# Patient Record
Sex: Female | Born: 1965 | Race: White | Hispanic: No | Marital: Single | Smoking: Never smoker
Health system: Southern US, Community
[De-identification: ages and names within clinical notes are randomized; demographics above are authoritative.]

## PROBLEM LIST (undated history)

## (undated) DIAGNOSIS — I10 Essential (primary) hypertension: Secondary | ICD-10-CM

## (undated) DIAGNOSIS — G4730 Sleep apnea, unspecified: Secondary | ICD-10-CM

## (undated) DIAGNOSIS — J302 Other seasonal allergic rhinitis: Secondary | ICD-10-CM

## (undated) DIAGNOSIS — D649 Anemia, unspecified: Secondary | ICD-10-CM

## (undated) DIAGNOSIS — E7800 Pure hypercholesterolemia, unspecified: Secondary | ICD-10-CM

## (undated) DIAGNOSIS — J45909 Unspecified asthma, uncomplicated: Secondary | ICD-10-CM

## (undated) DIAGNOSIS — K219 Gastro-esophageal reflux disease without esophagitis: Secondary | ICD-10-CM

## (undated) DIAGNOSIS — E039 Hypothyroidism, unspecified: Secondary | ICD-10-CM

## (undated) DIAGNOSIS — Z8619 Personal history of other infectious and parasitic diseases: Secondary | ICD-10-CM

## (undated) DIAGNOSIS — R011 Cardiac murmur, unspecified: Secondary | ICD-10-CM

## (undated) HISTORY — PX: OTHER SURGICAL HISTORY: SHX169

## (undated) HISTORY — DX: Other seasonal allergic rhinitis: J30.2

## (undated) HISTORY — DX: Essential (primary) hypertension: I10

## (undated) HISTORY — DX: Sleep apnea, unspecified: G47.30

## (undated) HISTORY — DX: Hypothyroidism, unspecified: E03.9

## (undated) HISTORY — DX: Anemia, unspecified: D64.9

## (undated) HISTORY — DX: Cardiac murmur, unspecified: R01.1

## (undated) HISTORY — DX: Gastro-esophageal reflux disease without esophagitis: K21.9

## (undated) HISTORY — DX: Pure hypercholesterolemia, unspecified: E78.00

## (undated) HISTORY — DX: Unspecified asthma, uncomplicated: J45.909

## (undated) HISTORY — DX: Personal history of other infectious and parasitic diseases: Z86.19

---

## 2002-10-19 HISTORY — PX: DILATION AND CURETTAGE OF UTERUS: SHX78

## 2002-11-19 HISTORY — PX: MYOMECTOMY: SHX85

## 2003-03-21 HISTORY — PX: BREAST CYST ASPIRATION: SHX578

## 2003-06-11 HISTORY — PX: VAGINAL HYSTERECTOMY: SUR661

## 2003-06-19 HISTORY — PX: BREAST SURGERY: SHX581

## 2003-12-24 ENCOUNTER — Ambulatory Visit: Payer: Self-pay | Admitting: Internal Medicine

## 2004-01-25 ENCOUNTER — Ambulatory Visit: Payer: Self-pay

## 2004-02-18 ENCOUNTER — Ambulatory Visit: Payer: Self-pay | Admitting: Internal Medicine

## 2004-04-11 ENCOUNTER — Ambulatory Visit: Payer: Self-pay

## 2004-04-14 ENCOUNTER — Ambulatory Visit: Payer: Self-pay | Admitting: Internal Medicine

## 2004-05-05 ENCOUNTER — Ambulatory Visit: Payer: Self-pay | Admitting: Internal Medicine

## 2004-05-18 ENCOUNTER — Ambulatory Visit: Payer: Self-pay | Admitting: Internal Medicine

## 2004-06-18 ENCOUNTER — Ambulatory Visit: Payer: Self-pay | Admitting: Internal Medicine

## 2004-09-08 ENCOUNTER — Ambulatory Visit: Payer: Self-pay | Admitting: Obstetrics and Gynecology

## 2005-03-20 HISTORY — PX: CHOLECYSTECTOMY: SHX55

## 2005-07-20 ENCOUNTER — Ambulatory Visit: Payer: Self-pay | Admitting: Internal Medicine

## 2006-01-22 ENCOUNTER — Emergency Department: Payer: Self-pay | Admitting: Emergency Medicine

## 2006-01-31 ENCOUNTER — Ambulatory Visit: Payer: Self-pay | Admitting: General Surgery

## 2006-09-27 ENCOUNTER — Ambulatory Visit: Payer: Self-pay | Admitting: Internal Medicine

## 2007-09-30 ENCOUNTER — Ambulatory Visit: Payer: Self-pay | Admitting: Internal Medicine

## 2008-08-06 ENCOUNTER — Ambulatory Visit (HOSPITAL_COMMUNITY): Admission: RE | Admit: 2008-08-06 | Discharge: 2008-08-06 | Payer: Self-pay | Admitting: Surgery

## 2008-08-20 ENCOUNTER — Ambulatory Visit (HOSPITAL_COMMUNITY): Admission: RE | Admit: 2008-08-20 | Discharge: 2008-08-20 | Payer: Self-pay | Admitting: Surgery

## 2008-08-24 ENCOUNTER — Encounter: Admission: RE | Admit: 2008-08-24 | Discharge: 2008-08-24 | Payer: Self-pay | Admitting: Surgery

## 2008-08-26 ENCOUNTER — Ambulatory Visit: Payer: Self-pay | Admitting: Internal Medicine

## 2008-09-17 ENCOUNTER — Ambulatory Visit: Payer: Self-pay | Admitting: Internal Medicine

## 2008-10-01 ENCOUNTER — Ambulatory Visit: Payer: Self-pay | Admitting: Internal Medicine

## 2008-10-20 ENCOUNTER — Ambulatory Visit: Payer: Self-pay | Admitting: Internal Medicine

## 2008-11-19 ENCOUNTER — Encounter: Admission: RE | Admit: 2008-11-19 | Discharge: 2008-11-19 | Payer: Self-pay | Admitting: Surgery

## 2008-12-07 ENCOUNTER — Ambulatory Visit (HOSPITAL_COMMUNITY): Admission: RE | Admit: 2008-12-07 | Discharge: 2008-12-07 | Payer: Self-pay | Admitting: Surgery

## 2009-01-19 ENCOUNTER — Ambulatory Visit (HOSPITAL_COMMUNITY): Admission: RE | Admit: 2009-01-19 | Discharge: 2009-01-20 | Payer: Self-pay | Admitting: Surgery

## 2009-01-29 HISTORY — PX: LAPAROSCOPIC GASTRIC BANDING: SHX1100

## 2009-02-02 ENCOUNTER — Encounter: Admission: RE | Admit: 2009-02-02 | Discharge: 2009-02-02 | Payer: Self-pay | Admitting: Surgery

## 2009-03-22 ENCOUNTER — Encounter: Admission: RE | Admit: 2009-03-22 | Discharge: 2009-06-20 | Payer: Self-pay | Admitting: Surgery

## 2009-08-10 ENCOUNTER — Encounter: Admission: RE | Admit: 2009-08-10 | Discharge: 2009-08-10 | Payer: Self-pay | Admitting: Surgery

## 2009-11-05 ENCOUNTER — Ambulatory Visit: Payer: Self-pay | Admitting: Internal Medicine

## 2010-01-31 ENCOUNTER — Encounter: Admission: RE | Admit: 2010-01-31 | Discharge: 2010-01-31 | Payer: Self-pay | Admitting: General Surgery

## 2010-05-23 ENCOUNTER — Encounter: Payer: Self-pay | Attending: Surgery | Admitting: *Deleted

## 2010-05-23 DIAGNOSIS — Z01818 Encounter for other preprocedural examination: Secondary | ICD-10-CM | POA: Insufficient documentation

## 2010-05-23 DIAGNOSIS — Z713 Dietary counseling and surveillance: Secondary | ICD-10-CM | POA: Insufficient documentation

## 2010-06-22 LAB — DIFFERENTIAL
Basophils Absolute: 0.1 10*3/uL (ref 0.0–0.1)
Eosinophils Relative: 0 % (ref 0–5)
Lymphocytes Relative: 15 % (ref 12–46)
Lymphocytes Relative: 8 % — ABNORMAL LOW (ref 12–46)
Lymphs Abs: 1.1 10*3/uL (ref 0.7–4.0)
Monocytes Absolute: 0.6 10*3/uL (ref 0.1–1.0)
Neutro Abs: 8.8 10*3/uL — ABNORMAL HIGH (ref 1.7–7.7)
Neutrophils Relative %: 77 % (ref 43–77)

## 2010-06-22 LAB — COMPREHENSIVE METABOLIC PANEL
BUN: 13 mg/dL (ref 6–23)
CO2: 31 mEq/L (ref 19–32)
Chloride: 99 mEq/L (ref 96–112)
Creatinine, Ser: 0.73 mg/dL (ref 0.4–1.2)
GFR calc non Af Amer: >60 mL/min (ref >60)
Total Bilirubin: 0.8 mg/dL (ref 0.3–1.2)

## 2010-06-22 LAB — CBC
HCT: 41.5 % (ref 36.0–46.0)
HCT: 43.7 % (ref 36.0–46.0)
Hemoglobin: 14.1 g/dL (ref 12.0–15.0)
MCV: 88.4 fL (ref 78.0–100.0)
Platelets: 279 10*3/uL (ref 150–400)
RBC: 4.94 MIL/uL (ref 3.87–5.11)
WBC: 11.4 10*3/uL — ABNORMAL HIGH (ref 4.0–10.5)
WBC: 14.4 10*3/uL — ABNORMAL HIGH (ref 4.0–10.5)

## 2010-06-24 LAB — COMPREHENSIVE METABOLIC PANEL
Albumin: 3.7 g/dL (ref 3.5–5.2)
BUN: 14 mg/dL (ref 6–23)
Chloride: 100 mEq/L (ref 96–112)
Creatinine, Ser: 0.73 mg/dL (ref 0.4–1.2)
GFR calc non Af Amer: >60 mL/min (ref >60)
Glucose, Bld: 86 mg/dL (ref 70–99)
Total Bilirubin: 0.7 mg/dL (ref 0.3–1.2)

## 2010-06-24 LAB — DIFFERENTIAL
Basophils Absolute: 0.1 10*3/uL (ref 0.0–0.1)
Lymphocytes Relative: 17 % (ref 12–46)
Monocytes Absolute: 0.7 10*3/uL (ref 0.1–1.0)
Neutro Abs: 7 10*3/uL (ref 1.7–7.7)
Neutrophils Relative %: 74 % (ref 43–77)

## 2010-06-24 LAB — CBC
HCT: 42.3 % (ref 36.0–46.0)
MCV: 87.7 fL (ref 78.0–100.0)
Platelets: 281 10*3/uL (ref 150–400)
WBC: 9.5 10*3/uL (ref 4.0–10.5)

## 2010-08-12 ENCOUNTER — Ambulatory Visit: Payer: Self-pay | Admitting: Gastroenterology

## 2010-11-07 ENCOUNTER — Ambulatory Visit: Payer: Self-pay | Admitting: Internal Medicine

## 2010-11-25 ENCOUNTER — Encounter (INDEPENDENT_AMBULATORY_CARE_PROVIDER_SITE_OTHER): Payer: 59

## 2010-11-25 ENCOUNTER — Encounter (INDEPENDENT_AMBULATORY_CARE_PROVIDER_SITE_OTHER): Payer: Self-pay

## 2010-11-25 ENCOUNTER — Ambulatory Visit (INDEPENDENT_AMBULATORY_CARE_PROVIDER_SITE_OTHER): Payer: 59 | Admitting: Physician Assistant

## 2010-11-25 VITALS — BP 140/90 | HR 60 | Ht 64.0 in | Wt 253.4 lb

## 2010-11-25 DIAGNOSIS — Z4651 Encounter for fitting and adjustment of gastric lap band: Secondary | ICD-10-CM

## 2010-11-25 NOTE — Progress Notes (Signed)
  HISTORY: Kristine Hendricks is a 45 y.o.female who received an AP-Large lap-band in November 2010 by Dr. Daphine Deutscher. She denies persistent vomiting or regurgitation. She says her hunger has increased somewhat as have her portion sizes.  VITAL SIGNS: Filed Vitals:   11/25/10 1512  BP: 140/90  Pulse: 60    PHYSICAL EXAM: Physical exam reveals a very well-appearing 45 y.o.female in no apparent distress Neurologic: Awake, alert, oriented Psych: Bright affect, conversant Respiratory: Breathing even and unlabored. No stridor or wheezing Abdomen: Soft, nontender, nondistended to palpation. Incisions well-healed. No incisional hernias. Port easily palpated. Extremities: Atraumatic, good range of motion.  ASSESMENT: 45 y.o.  female  s/p AP-Large lap-band.   PLAN: The patient's port was accessed with a 20G Huber needle without difficulty. Clear fluid was aspirated and 0.25 mL saline was added to the port to give a total predicted volume of 6.75 mL. The patient was able to swallow water without difficulty following the procedure and was instructed to take clear liquids for the next 24-48 hours and advance slowly as tolerated.

## 2010-11-25 NOTE — Patient Instructions (Signed)
Take clear liquids for the next 48 hours. Thin protein shakes are ok to start on Saturday evening. Call us if you have persistent vomiting or regurgitation, night cough or reflux symptoms. Return as scheduled or sooner if you notice no changes in hunger/portion sizes.   

## 2010-12-01 ENCOUNTER — Telehealth (INDEPENDENT_AMBULATORY_CARE_PROVIDER_SITE_OTHER): Payer: Self-pay

## 2010-12-01 NOTE — Telephone Encounter (Signed)
Left message telling patient [facility] for tomorrow is canceled and was trying to reach her to reschedule.

## 2010-12-02 ENCOUNTER — Encounter (INDEPENDENT_AMBULATORY_CARE_PROVIDER_SITE_OTHER): Payer: 59

## 2010-12-23 ENCOUNTER — Encounter (INDEPENDENT_AMBULATORY_CARE_PROVIDER_SITE_OTHER): Payer: Self-pay

## 2010-12-23 ENCOUNTER — Ambulatory Visit (INDEPENDENT_AMBULATORY_CARE_PROVIDER_SITE_OTHER): Payer: 59 | Admitting: Physician Assistant

## 2010-12-23 VITALS — BP 142/88 | HR 64 | Temp 98.0°F | Resp 20 | Ht 64.0 in | Wt 248.1 lb

## 2010-12-23 DIAGNOSIS — K219 Gastro-esophageal reflux disease without esophagitis: Secondary | ICD-10-CM

## 2010-12-23 DIAGNOSIS — Z4651 Encounter for fitting and adjustment of gastric lap band: Secondary | ICD-10-CM

## 2010-12-23 NOTE — Patient Instructions (Signed)
Take omeprazole (20 mg tablets, over-the-counter) twice a day for one week followed by once a day for one week. Return in one month. Return next week if symptoms persist despite the above measures.

## 2010-12-23 NOTE — Progress Notes (Signed)
  HISTORY: Kristine Hendricks is a 45 y.o.female who received an AP-Standard lap-band in November 2010 by Dr. Daphine Deutscher. She comes in with a 2 week history of significant GERD symptoms. No solid food dysphagia. She's awakened from sleep nightly from this.  VITAL SIGNS: Filed Vitals:   12/23/10 1134  BP: 142/88  Pulse: 64  Temp: 98 F (36.7 C)  Resp: 20    PHYSICAL EXAM: Physical exam reveals a very well-appearing 45 y.o.female in no apparent distress Neurologic: Awake, alert, oriented Psych: Bright affect, conversant Respiratory: Breathing even and unlabored. No stridor or wheezing Abdomen: Soft, nontender, nondistended to palpation. Incisions well-healed. No incisional hernias. Port easily palpated. Extremities: Atraumatic, good range of motion.  ASSESMENT: 45 y.o.  female  s/p AP-Standard lap-band.   PLAN: The patient's port was accessed with a 20G Huber needle without difficulty. Clear fluid was aspirated and 0.3 mL saline was removed from the port to give a total predicted volume of 6.45 mL. I've recommended omeprazole BID for one week followed by QD for one week. Follow-up in one month.

## 2011-01-20 ENCOUNTER — Encounter (INDEPENDENT_AMBULATORY_CARE_PROVIDER_SITE_OTHER): Payer: 59

## 2011-01-20 ENCOUNTER — Ambulatory Visit
Admission: RE | Admit: 2011-01-20 | Discharge: 2011-01-20 | Disposition: A | Discharge status: Home / Self Care | Payer: 59 | Source: Ambulatory Visit | Attending: Physician Assistant | Admitting: Physician Assistant

## 2011-01-20 ENCOUNTER — Ambulatory Visit (INDEPENDENT_AMBULATORY_CARE_PROVIDER_SITE_OTHER): Payer: 59 | Admitting: Physician Assistant

## 2011-01-20 ENCOUNTER — Encounter (INDEPENDENT_AMBULATORY_CARE_PROVIDER_SITE_OTHER): Payer: Self-pay

## 2011-01-20 VITALS — BP 150/100 | HR 60 | Resp 18 | Ht 64.0 in | Wt 247.0 lb

## 2011-01-20 DIAGNOSIS — Z4651 Encounter for fitting and adjustment of gastric lap band: Secondary | ICD-10-CM

## 2011-01-20 DIAGNOSIS — K219 Gastro-esophageal reflux disease without esophagitis: Secondary | ICD-10-CM

## 2011-01-20 NOTE — Progress Notes (Signed)
  HISTORY: CESILIA SHINN is a 45 y.o.female who received an AP-Large lap-band in November 2010 by Dr. Daphine Deutscher. She was seen on 10/5 for GERD symptoms and had fluid removed. She says her symptoms improved but not completely. She has continued nocturnal reflux, worse if she doesn't take a PPI.  VITAL SIGNS: Filed Vitals:   01/20/11 1136  BP: 150/100  Pulse: 60  Resp: 18    PHYSICAL EXAM: Physical exam reveals a very well-appearing 44 y.o.female in no apparent distress Neurologic: Awake, alert, oriented Psych: Bright affect, conversant Respiratory: Breathing even and unlabored. No stridor or wheezing Abdomen: Soft, nontender, nondistended to palpation. Incisions well-healed. No incisional hernias. Port easily palpated. Extremities: Atraumatic, good range of motion.  ASSESMENT: 45 y.o.  female  s/p AP-Large lap-band.   PLAN: The patient's port was accessed with a 20G Huber needle without difficulty. Clear fluid was aspirated and 0.25 mL saline was removed from the port to give a total predicted volume of 6.2 mL. I've ordered a KUB to evaluate band position. If her symptoms improve, we'll have her back in one month. If not, I'll likely order an upper GI.

## 2011-01-20 NOTE — Patient Instructions (Signed)
Obtain x-ray today. Return next week if symptoms persist. Return in 3 months otherwise.

## 2011-01-26 ENCOUNTER — Telehealth (INDEPENDENT_AMBULATORY_CARE_PROVIDER_SITE_OTHER): Payer: Self-pay | Admitting: Physician Assistant

## 2011-05-18 ENCOUNTER — Encounter (INDEPENDENT_AMBULATORY_CARE_PROVIDER_SITE_OTHER): Payer: Self-pay

## 2011-05-18 ENCOUNTER — Ambulatory Visit (INDEPENDENT_AMBULATORY_CARE_PROVIDER_SITE_OTHER): Payer: 59 | Admitting: Physician Assistant

## 2011-05-18 NOTE — Patient Instructions (Signed)
Take a protein shake daily for breakfast. Contact Amy in nutrition for a consultation. Return in 2 months or sooner if needed.

## 2011-05-18 NOTE — Progress Notes (Signed)
  HISTORY: Kristine Hendricks is a 46 y.o.female who received an AP-Large lap-band in November 2010 by Dr. Daphine Deutscher. She was last seen in November and has since gained eight pounds. She attributes this from eating less than usual and making poor food choices. She's not been eating breakfast of late, which she said helped her early in her weight loss.  VITAL SIGNS: Filed Vitals:   05/18/11 1058  BP: 130/82    PHYSICAL EXAM: Physical exam reveals a very well-appearing 46 y.o.female in no apparent distress Neurologic: Awake, alert, oriented Psych: Bright affect, conversant Respiratory: Breathing even and unlabored. No stridor or wheezing Extremities: Atraumatic, good range of motion. Skin: Warm, Dry, no rashes Musculoskeletal: Normal gait, Joints normal  ASSESMENT: 46 y.o.  female  s/p AP-Large lap-band.   PLAN: We talked about how she's very much in the green zone but her weight gain is likely due to her food choices throughout the day. I recommended having a protein shake daily and to have a nutrition consultation.

## 2011-07-13 ENCOUNTER — Encounter (INDEPENDENT_AMBULATORY_CARE_PROVIDER_SITE_OTHER): Payer: 59

## 2011-08-10 ENCOUNTER — Ambulatory Visit (INDEPENDENT_AMBULATORY_CARE_PROVIDER_SITE_OTHER): Payer: 59 | Admitting: Physician Assistant

## 2011-08-10 ENCOUNTER — Encounter (INDEPENDENT_AMBULATORY_CARE_PROVIDER_SITE_OTHER): Payer: Self-pay

## 2011-08-10 VITALS — BP 148/90 | HR 72 | Temp 98.6°F | Resp 14 | Ht 64.0 in | Wt 257.2 lb

## 2011-08-10 DIAGNOSIS — Z4651 Encounter for fitting and adjustment of gastric lap band: Secondary | ICD-10-CM

## 2011-08-10 NOTE — Patient Instructions (Signed)
Take clear liquids tonight. Thin protein shakes are ok to start tomorrow morning. Slowly advance your diet thereafter. Call us if you have persistent vomiting or regurgitation, night cough or reflux symptoms. Return as scheduled or sooner if you notice no changes in hunger/portion sizes.  

## 2011-08-10 NOTE — Progress Notes (Signed)
  HISTORY: Kristine Hendricks is a 46 y.o.female who received an AP-Standard lap-band in November 2010 by Dr. Daphine Deutscher. She comes in with no new complaints other than increasing hunger and portion sizes. She denies persistent regurgitation or reflux. She's getting back on track with good food choices and she believes an adjustment will help her lose some more weight.  VITAL SIGNS: Filed Vitals:   08/10/11 1152  BP: 148/90  Pulse: 72  Temp: 98.6 F (37 C)  Resp: 14    PHYSICAL EXAM: Physical exam reveals a very well-appearing 46 y.o.female in no apparent distress Neurologic: Awake, alert, oriented Psych: Bright affect, conversant Respiratory: Breathing even and unlabored. No stridor or wheezing Abdomen: Soft, nontender, nondistended to palpation. Incisions well-healed. No incisional hernias. Port easily palpated. Extremities: Atraumatic, good range of motion.  ASSESMENT: 46 y.o.  female  s/p AP-Standard lap-band.   PLAN: The patient's port was accessed with a 20G Huber needle without difficulty. Clear fluid was aspirated and 0.25 mL saline was added to the port to give a total predicted volume of 6.45 mL. The patient was able to swallow water without difficulty following the procedure and was instructed to take clear liquids for the next 24-48 hours and advance slowly as tolerated.

## 2011-12-12 ENCOUNTER — Ambulatory Visit: Payer: Self-pay | Admitting: Internal Medicine

## 2012-02-12 ENCOUNTER — Encounter: Payer: Self-pay | Admitting: Gastroenterology

## 2012-02-12 ENCOUNTER — Ambulatory Visit (INDEPENDENT_AMBULATORY_CARE_PROVIDER_SITE_OTHER): Payer: 59 | Admitting: Internal Medicine

## 2012-02-12 ENCOUNTER — Encounter: Payer: Self-pay | Admitting: Internal Medicine

## 2012-02-12 VITALS — BP 126/80 | HR 67 | Temp 97.9°F | Ht 64.0 in | Wt 252.8 lb

## 2012-02-12 DIAGNOSIS — R112 Nausea with vomiting, unspecified: Secondary | ICD-10-CM

## 2012-02-12 DIAGNOSIS — K219 Gastro-esophageal reflux disease without esophagitis: Secondary | ICD-10-CM

## 2012-02-12 DIAGNOSIS — E7800 Pure hypercholesterolemia, unspecified: Secondary | ICD-10-CM

## 2012-02-12 DIAGNOSIS — I10 Essential (primary) hypertension: Secondary | ICD-10-CM

## 2012-02-12 DIAGNOSIS — G4730 Sleep apnea, unspecified: Secondary | ICD-10-CM

## 2012-02-12 DIAGNOSIS — Z139 Encounter for screening, unspecified: Secondary | ICD-10-CM

## 2012-02-12 MED ORDER — LOSARTAN POTASSIUM 25 MG PO TABS: 25.0000 mg | ORAL_TABLET | Freq: Every day | ORAL | Status: DC

## 2012-02-12 MED ORDER — PANTOPRAZOLE SODIUM 40 MG PO TBEC: 40.0000 mg | DELAYED_RELEASE_TABLET | Freq: Every day | ORAL | Status: DC

## 2012-02-12 NOTE — Patient Instructions (Addendum)
It was nice seeing you today.  I am sorry you have not been feeling well.  I want you to start Protonix 40mg  - take 30 minutes before breakfast and zantac (ranitidine) 150mg  - 30 minutes before your evening meal.  Also I am going to start you on Losartan 25mg  - for your blood pressure.  Spot check your pressures and let me know if any problems.

## 2012-02-14 ENCOUNTER — Telehealth: Payer: Self-pay | Admitting: *Deleted

## 2012-02-14 NOTE — Telephone Encounter (Signed)
Called patient to let her know that I was faxing and mailing out [facility] form.

## 2012-02-14 NOTE — Telephone Encounter (Signed)
Patient called to let us know that her lab orders were not at [facility] when she arrived for blood work today. [employer] told patient that they had not received the orders yet.

## 2012-02-14 NOTE — Telephone Encounter (Signed)
I wrote orders on [facility] form.  They are already in computer.   Form on your desk

## 2012-02-17 ENCOUNTER — Encounter: Payer: Self-pay | Admitting: Internal Medicine

## 2012-02-17 DIAGNOSIS — G4730 Sleep apnea, unspecified: Secondary | ICD-10-CM | POA: Insufficient documentation

## 2012-02-17 DIAGNOSIS — K219 Gastro-esophageal reflux disease without esophagitis: Secondary | ICD-10-CM | POA: Insufficient documentation

## 2012-02-17 NOTE — Assessment & Plan Note (Signed)
Was previously diagnosed with sleep apnea.  Lost weight.  Not requiring CPAP now.  Follow.

## 2012-02-17 NOTE — Progress Notes (Signed)
  Subjective:    Patient ID: Kristine Hendricks, female    DOB: [birth year], 46 y.o.   MRN: [id number]  HPI 46 year old female with past history of hypertension, sleep apnea and hypercholesterolemia who is s/p lap band surgery with significant weight loss.  She comes in today for a scheduled follow up.  She saw Dr Lysle Dingwall [facility]) 9/13 for elevated blood pressure.  Increased stress.  He had given her a prescription for a blood pressure medicine.  She never started.  States her blood pressure has been averaging 150/85.  She has also had worsening acid reflux with associated emesis.  Decreased appetite secondary to this.  Gets full fast.  Has been having increased drainage and cough.  Has taken amoxicillin and an antihistamine - with no relief.  Describes an irritated throat.    Past Medical History  Diagnosis Date  . Hypertension   . Hypercholesterolemia   . Seasonal allergies   . Sleep apnea   . Chicken pox   . Asthma   . GERD (gastroesophageal reflux disease)     Review of Systems Patient denies any headache, lightheadedness or dizziness.  She does report the irritated throat and increased drainage as outlined.  No chest pain, tightness or palpitations.  No increased shortness of breath.  Does report some cough.  Acid reflux and emesis as outlined.  Gets full fast.  No abdominal pain or cramping.  No bowel change, such as diarrhea, constipation, BRBPR or melana.  No urine change.        Objective:   Physical Exam Filed Vitals:   02/12/12 1538  BP: 126/80  Pulse: 67  Temp: 97.9 F (36.6 C)   Blood pressure recheck:  66/107  46 year old female in no acute distress.   HEENT:  Nares - clear.  OP- without lesions or erythema.  TMs without erythema.   NECK:  Supple, nontender.  No audible bruit.   HEART:  Appears to be regular. LUNGS:  Without crackles or wheezing audible.  Respirations even and unlabored.   RADIAL PULSE:  Equal bilaterally.  ABDOMEN:  Soft, nontender.  No audible abdominal  bruit.   EXTREMITIES:  No increased edema to be present.                     Assessment & Plan:  INCREASED DRAINAGE, COUGH AND THROAT IRRITATION.  I do not feel she needs more abx.  I feel a lot of her symptoms currently are coming from the increased acid reflux.  Start Protonix 40mg  in the am and Zantac 150mg  in the evening.  Follow closely. Get her back in soon to reassess.    INCREASED PSYCHOSOCIAL STRESSORS.  Feels she is handling things relatively well.  Follow.   HEALTH MAINTENANCE.  Obtain results from last mammogram.  Physical 08/10/11.  She is s/p hysterectomy.  Colonoscopy 08/12/10 - normal.

## 2012-02-17 NOTE — Assessment & Plan Note (Signed)
Blood pressure elelvated.  Will start Losartan 25mg  q day.  Follow pressures.  Check metabolic panel.

## 2012-02-17 NOTE — Assessment & Plan Note (Signed)
Low cholesterol diet and exercise.  Check lipid panel with next labs.  

## 2012-02-17 NOTE — Assessment & Plan Note (Addendum)
Will start Protonix in the am and Zantac before her evening meal.  Get her back in soon to reassess.  Follow.  With the increased reflux, emesis, decrease appetite and early satiety - will refer to GI for evaluation and question of need for EGD.  Will hold on ultrasound or CT.  Follow.

## 2012-02-19 ENCOUNTER — Encounter: Payer: Self-pay | Admitting: Gastroenterology

## 2012-02-19 ENCOUNTER — Ambulatory Visit (INDEPENDENT_AMBULATORY_CARE_PROVIDER_SITE_OTHER): Payer: 59 | Admitting: Gastroenterology

## 2012-02-19 VITALS — BP 120/88 | HR 60 | Ht 64.0 in | Wt 253.8 lb

## 2012-02-19 DIAGNOSIS — Z6841 Body Mass Index (BMI) 40.0 and over, adult: Secondary | ICD-10-CM | POA: Insufficient documentation

## 2012-02-19 DIAGNOSIS — K219 Gastro-esophageal reflux disease without esophagitis: Secondary | ICD-10-CM

## 2012-02-19 NOTE — Progress Notes (Signed)
Review of pertinent gastrointestinal problems: 1. Standard lap band Dr. Daphine Deutscher 2010; band volume increased 07/2011 in office (BMI was 44 at that point).  HPI: This is a     very pleasant 46 year old woman whom I am meeting for the first time today.  Has had several months of pyrosis. Was worse several months ago.  Improved a bit, but not signficantly.  Had a really bad cold, the antibiotic didn't help at all.  Was told to stop the antibiotic by PCP.  She has a chronic sore throat and a fullness in her ears.  Coughing still, at times it is productive.    No dypshagia.    No smoker, not much caffeine.  Lap band surgery, see above.  Was prescribed protonix about a week or two ago, but did not start it yet.  Was on it in the distant past, long ago.  Was eating pretty late, laying down soon after eating.  Tried OTC omprezole with incomplete results.  Review of systems: Pertinent positive and negative review of systems were noted in the above HPI section. Complete review of systems was performed and was otherwise normal.    Past Medical History  Diagnosis Date  . Hypertension   . Hypercholesterolemia   . Seasonal allergies   . Sleep apnea   . Chicken pox   . Asthma   . GERD (gastroesophageal reflux disease)   . Anemia     Past Surgical History  Procedure Date  . Laparoscopic gastric banding 01/29/09  . Cholecystectomy 2007  . Arthroscopic surgery     Dr Hyacinth Meeker  . Vaginal hysterectomy 06/11/03  . Myomectomy 9/04  . Dilation and curettage of uterus 8/04  . Breast surgery 4 /05    biopsy    Current Outpatient Prescriptions  Medication Sig Dispense Refill  . CALCIUM PO Take by mouth daily.        Marland Kitchen losartan (COZAAR) 25 MG tablet Take 1 tablet (25 mg total) by mouth daily.  30 tablet  1  . Multiple Vitamin (MULTIVITAMIN PO) Take by mouth daily.        . pantoprazole (PROTONIX) 40 MG tablet Take 1 tablet (40 mg total) by mouth daily.  30 tablet  3    Allergies as of  02/19/2012 - Review Complete 02/19/2012  Allergen Reaction Noted  . Sulfa antibiotics Rash 11/25/2010  . Codeine Nausea And Vomiting 11/25/2010    Family History  Problem Relation Age of Onset  . Lung cancer Mother   . Arthritis Mother   . Cancer Mother     lung  . Stroke Mother   . Hypertension Mother   . Mental retardation Mother   . Colon polyps Mother   . Thyroid disease Sister     graves disease  . Colon cancer      maternal grandparent  . Breast cancer Neg Hx   . Hyperlipidemia Father   . Stroke Maternal Aunt   . Stroke Maternal Uncle   . Cancer Maternal Grandmother     colon  . Colon cancer Maternal Grandmother   . Cancer Maternal Grandfather     colon  . Colon cancer Maternal Grandfather   . Alcohol abuse Cousin     History   Social History  . Marital Status: Single    Spouse Name: N/A    Number of Children: 0  . Years of Education: N/A   Occupational History  .  Lab [employer]   Social History Main Topics  .  Smoking status: Never Smoker   . Smokeless tobacco: Never Used  . Alcohol Use: No     Comment: occasional  . Drug Use: No  . Sexually Active: Not on file   Other Topics Concern  . Not on file   Social History Narrative  . No narrative on file       Physical Exam: BP 120/88  Pulse 60  Ht 5\' 4"  (1.626 m)  Wt 253 lb 12.8 oz (115.123 kg)  BMI 43.56 kg/m2 Constitutional: generally well-appearing except for morbid obesity Psychiatric: alert and oriented x3 Eyes: extraocular movements intact Mouth: oral pharynx moist, no lesions Neck: supple no lymphadenopathy Cardiovascular: heart regular rate and rhythm Lungs: clear to auscultation bilaterally Abdomen: soft, nontender, nondistended, no obvious ascites, no peritoneal signs, normal bowel sounds Extremities: no lower extremity edema bilaterally Skin: no lesions on visible extremities    Assessment and plan: 46 y.o. female with GERD, no alarm symptoms, lap band procedure 3 years  ago.  She has GERD without alarm symptoms. Her obesity and lap band probably contribute somewhat. She was also eating a lot of late meals and lays down shortly afterwards. She has modified that habit. She has not started her proton pump inhibitor that was prescribed 1-2 weeks ago and I advised shouldn't be started. It is best that she take it 20-30 minutes prior to her breakfast meal. I also recommended she start taking H2 blocker shortly before bedtime every night. She will call to report on her symptoms in 6-7 weeks and we will decide from there if she needs repeat endoscopy.

## 2012-02-19 NOTE — Patient Instructions (Addendum)
Losing weight can improve GERD.  You need to strictly adhere to your bariatric diet recommendations. Start the protonix, take 1 pill 20-30 min before breakfast meal. Take zantac or pepcid, one pill at bedtime every night. Call to report on your symptoms in 6 weeks.  If doing much better, then no need for repeat endoscopy.

## 2012-02-21 ENCOUNTER — Telehealth: Payer: Self-pay | Admitting: Internal Medicine

## 2012-02-21 NOTE — Telephone Encounter (Signed)
Notified pt of [facility] labs via My chart messaging.

## 2012-03-04 ENCOUNTER — Encounter: Payer: Self-pay | Admitting: Internal Medicine

## 2012-03-04 ENCOUNTER — Telehealth (INDEPENDENT_AMBULATORY_CARE_PROVIDER_SITE_OTHER): Payer: Self-pay

## 2012-03-04 ENCOUNTER — Ambulatory Visit
Admission: RE | Admit: 2012-03-04 | Discharge: 2012-03-04 | Disposition: A | Discharge status: Home / Self Care | Payer: 59 | Source: Ambulatory Visit | Attending: Surgery | Admitting: Surgery

## 2012-03-04 ENCOUNTER — Other Ambulatory Visit (INDEPENDENT_AMBULATORY_CARE_PROVIDER_SITE_OTHER): Payer: Self-pay | Admitting: Surgery

## 2012-03-04 NOTE — Telephone Encounter (Signed)
Pt has been having severe acid reflux for several months.  Last seen for a fill in February 2013.  She started taking Protonix 2 weeks ago along with Zantac 150mg  in the evening with not much relief.  She is requesting an x-ray prior to coming in on 03/07/12 to see Bary Richard, PA.

## 2012-03-07 ENCOUNTER — Encounter (INDEPENDENT_AMBULATORY_CARE_PROVIDER_SITE_OTHER): Payer: Self-pay

## 2012-03-07 ENCOUNTER — Ambulatory Visit (INDEPENDENT_AMBULATORY_CARE_PROVIDER_SITE_OTHER): Payer: 59 | Admitting: Physician Assistant

## 2012-03-07 VITALS — BP 136/94 | HR 72 | Temp 97.8°F | Resp 18 | Ht 64.0 in | Wt 252.5 lb

## 2012-03-07 DIAGNOSIS — Z4651 Encounter for fitting and adjustment of gastric lap band: Secondary | ICD-10-CM

## 2012-03-07 NOTE — Progress Notes (Signed)
  HISTORY: Kristine Hendricks is a 46 y.o.female who received an AP-Large lap-band in November 2010 by Dr. Daphine Deutscher. She comes in with 4-5 months of GERD symptoms for which she was placed on protonix and zantac with little relief. She is also having some solid food dysphagia. She had obtained a KUB which showed no interval change in band position.  VITAL SIGNS: Filed Vitals:   03/07/12 1521  BP: 136/94  Pulse: 72  Temp: 97.8 F (36.6 C)  Resp: 18    PHYSICAL EXAM: Physical exam reveals a very well-appearing 46 y.o.female in no apparent distress Neurologic: Awake, alert, oriented Psych: Bright affect, conversant Respiratory: Breathing even and unlabored. No stridor or wheezing Abdomen: Soft, nontender, nondistended to palpation. Incisions well-healed. No incisional hernias. Port easily palpated. Extremities: Atraumatic, good range of motion.  ASSESMENT: 46 y.o.  female  s/p AP-Large lap-band.   PLAN: The patient's port was accessed with a 20G Huber needle without difficulty. Clear fluid was aspirated and 3.4 mL saline was removed from the port leaving 3 mL. She was able to swallow water more easily afterward. I asked that she continue the PPI and H2 blockers and to return in one month.

## 2012-03-07 NOTE — Patient Instructions (Signed)
Return in one month. Focus on good food choices as well as physical activity. Return sooner if you have an increase in hunger, portion sizes or weight. Return also for difficulty swallowing, night cough, reflux.   

## 2012-03-11 ENCOUNTER — Ambulatory Visit (INDEPENDENT_AMBULATORY_CARE_PROVIDER_SITE_OTHER): Payer: 59 | Admitting: Internal Medicine

## 2012-03-11 ENCOUNTER — Encounter: Payer: Self-pay | Admitting: Internal Medicine

## 2012-03-11 VITALS — BP 120/80 | HR 59 | Temp 98.5°F | Ht 64.0 in | Wt 254.8 lb

## 2012-03-11 DIAGNOSIS — E7800 Pure hypercholesterolemia, unspecified: Secondary | ICD-10-CM

## 2012-03-11 DIAGNOSIS — I10 Essential (primary) hypertension: Secondary | ICD-10-CM

## 2012-03-11 DIAGNOSIS — K219 Gastro-esophageal reflux disease without esophagitis: Secondary | ICD-10-CM

## 2012-03-11 DIAGNOSIS — G4730 Sleep apnea, unspecified: Secondary | ICD-10-CM

## 2012-03-11 MED ORDER — LOSARTAN POTASSIUM 25 MG PO TABS: 25.0000 mg | ORAL_TABLET | Freq: Every day | ORAL | Status: DC

## 2012-03-16 ENCOUNTER — Encounter: Payer: Self-pay | Admitting: Internal Medicine

## 2012-03-16 NOTE — Assessment & Plan Note (Signed)
Symptoms improved after port accessed and saline removed.  Continue on Protonix and Zantac.  Follow.

## 2012-03-16 NOTE — Assessment & Plan Note (Signed)
Low cholesterol diet and exercise.  Follow lipid profile.    

## 2012-03-16 NOTE — Assessment & Plan Note (Signed)
On Losartan and blood pressure is doing better.  Follow.  Check metabolic panel.

## 2012-03-16 NOTE — Progress Notes (Signed)
  Subjective:    Patient ID: Kristine Hendricks, female    DOB: [birth year], 46 y.o.   MRN: [id number]  HPI 46 year old female with past history of hypertension, sleep apnea and hypercholesterolemia who is s/p lap band surgery with significant weight loss.  She comes in today for a scheduled follow up.  She just had her port accessed with saline removed.  Swallowing better.  Feels better.  Continuing on Protonix and Zantac.  Blood pressure is doing better.  Blood pressure averaging 120/70s now.  No cardiac symptoms with increased activity or exertion.  Breathing stable.      Past Medical History  Diagnosis Date  . Hypertension   . Hypercholesterolemia   . Seasonal allergies   . Sleep apnea   . History of chicken pox   . Asthma   . GERD (gastroesophageal reflux disease)   . Anemia    Current Outpatient Prescriptions on File Prior to Visit  Medication Sig Dispense Refill  . CALCIUM PO Take by mouth daily.        Marland Kitchen losartan (COZAAR) 25 MG tablet Take 1 tablet (25 mg total) by mouth daily.  90 tablet  3  . Multiple Vitamin (MULTIVITAMIN PO) Take by mouth daily.        . pantoprazole (PROTONIX) 40 MG tablet Take 1 tablet (40 mg total) by mouth daily.  30 tablet  3    Review of Systems Patient denies any headache, lightheadedness or dizziness.  No sinu or allergy symptoms.  No throat irritation.   No chest pain, tightness or palpitations.  No increased shortness of breath.  No swallowing problems now.   No abdominal pain or cramping.  No bowel change, such as diarrhea, constipation, BRBPR or melana.  No urine change.        Objective:   Physical Exam  Filed Vitals:   03/11/12 0949  BP: 120/80  Pulse: 59  Temp: 98.5 F (57.24 C)   46 year old female in no acute distress.   HEENT:  Nares - clear.  OP- without lesions or erythema.   NECK:  Supple, nontender.  No audible bruit.   HEART:  Appears to be regular. LUNGS:  Without crackles or wheezing audible.  Respirations even and unlabored.     RADIAL PULSE:  Equal bilaterally.  ABDOMEN:  Soft, nontender.  No audible abdominal bruit.   EXTREMITIES:  No increased edema to be present.                     Assessment & Plan:  INCREASED DRAINAGE, COUGH AND THROAT IRRITATION.  Noted previously.  Resolved now.  Continue protonix and zantac.    INCREASED PSYCHOSOCIAL STRESSORS.  Feels she is handling things relatively well.  Follow.   HEALTH MAINTENANCE.  Mammogram 12/12/11 - BiRADS II.    Physical 08/10/11.  She is s/p hysterectomy.  Colonoscopy 08/12/10 - normal.

## 2012-03-16 NOTE — Assessment & Plan Note (Signed)
Doing well. With her weight loss.  Follow.

## 2012-03-20 ENCOUNTER — Telehealth: Payer: Self-pay | Admitting: Internal Medicine

## 2012-03-20 NOTE — Telephone Encounter (Signed)
Pt notified of normal labs via my chart messaging.

## 2012-03-22 ENCOUNTER — Other Ambulatory Visit: Payer: Self-pay | Admitting: Internal Medicine

## 2012-03-22 MED ORDER — PANTOPRAZOLE SODIUM 40 MG PO TBEC: 40.0000 mg | DELAYED_RELEASE_TABLET | Freq: Every day | ORAL | Status: DC

## 2012-03-22 NOTE — Telephone Encounter (Signed)
Pantoprazole tab  [facility] is requesting Strength, directions, quantity and refills.

## 2012-03-22 NOTE — Telephone Encounter (Signed)
Sent in to pharmacy.  

## 2012-03-24 ENCOUNTER — Telehealth: Payer: Self-pay | Admitting: Internal Medicine

## 2012-03-24 MED ORDER — PANTOPRAZOLE SODIUM 40 MG PO TBEC: 40.0000 mg | DELAYED_RELEASE_TABLET | Freq: Every day | ORAL | Status: DC

## 2012-03-24 NOTE — Telephone Encounter (Signed)
Received fax for mail order refill.  Previous rx was sent in for 30 pills.  rx sent in for #90 with one refill (protonix).

## 2012-04-08 ENCOUNTER — Encounter: Payer: Self-pay | Admitting: Internal Medicine

## 2012-04-09 ENCOUNTER — Encounter: Payer: Self-pay | Admitting: Internal Medicine

## 2012-04-11 ENCOUNTER — Ambulatory Visit (INDEPENDENT_AMBULATORY_CARE_PROVIDER_SITE_OTHER): Payer: 59 | Admitting: Adult Health

## 2012-04-11 ENCOUNTER — Telehealth: Payer: Self-pay | Admitting: Internal Medicine

## 2012-04-11 ENCOUNTER — Encounter: Payer: Self-pay | Admitting: Adult Health

## 2012-04-11 ENCOUNTER — Encounter (INDEPENDENT_AMBULATORY_CARE_PROVIDER_SITE_OTHER): Payer: 59

## 2012-04-11 VITALS — BP 128/88 | HR 77 | Temp 99.1°F | Resp 16 | Ht 64.0 in | Wt 250.0 lb

## 2012-04-11 DIAGNOSIS — J111 Influenza due to unidentified influenza virus with other respiratory manifestations: Secondary | ICD-10-CM

## 2012-04-11 DIAGNOSIS — R6889 Other general symptoms and signs: Secondary | ICD-10-CM

## 2012-04-11 DIAGNOSIS — J069 Acute upper respiratory infection, unspecified: Secondary | ICD-10-CM

## 2012-04-11 LAB — POCT INFLUENZA A/B: Influenza B, POC: NEGATIVE

## 2012-04-11 MED ORDER — AZITHROMYCIN 250 MG PO TABS: ORAL_TABLET | ORAL | Status: DC

## 2012-04-11 NOTE — Telephone Encounter (Signed)
Patient Information:  Caller Name: Maryalice  Phone: [phone]  Patient: Kristine Hendricks  Gender: Female  DOB: [birth year]  Age: 47 Years  PCP: Dale   Pregnant: No  Office Follow Up:  Does the office need to follow up with this patient?: Yes  Instructions For The Office: POSSIBLE FLU   Symptoms  Reason For Call & Symptoms: Patient states onset of illness on Sunday 04/07/12.  +tired and cough symptoms along with congestion.  Non productive "hacky" cough, +headache. Fevers at home 99.0 -101.0 (o).  Went to work today and is now leaving because she feels feverish. Recent  trip with 11 people and 5 + flu results. Patient did take flu immunization  Reviewed Health History In EMR: Yes  Reviewed Medications In EMR: Yes  Reviewed Allergies In EMR: Yes  Reviewed Surgeries / Procedures: No  Date of Onset of Symptoms: 04/07/2012  Treatments Tried: Advil and nyquil  Treatments Tried Worked: Yes  Any Fever: Yes  Fever Taken: Tactile  Fever Time Of Reading: 09:14:00  Fever Last Reading: N/A OB / GYN:  LMP: Unknown  Guideline(s) Used:  Influenza Exposure  Influenza - Seasonal  Disposition Per Guideline:   See Today in Office  Reason For Disposition Reached:   Fever present > 3 days (72 hours)  Advice Given:  Reassurance  For most healthy adults, influenza feels like a bad cold. The dangers of influenza for normal, healthy people (under 71 years of age) are overrated.  The treatment of influenza depends on your main symptoms. Generally, treatment is the same as for other viral respiratory infections (colds). Bed rest is unnecessary.  Here is some care advice that should help.  Treating the Symptoms of Flu  Fever, Muscle Aches, and Headache: For fever more than 101 F (38.3 C), muscle aches, and headaches, take acetaminophen every 4-6 hours (Adults 650 mg) OR ibuprofen every 6-8 hours (Adults 400-600 mg).  Sore Throat: Use throat lozenges, hard candy or warm chicken broth.  Cough: Use cough drops.  Hydrate: Drink extra liquids. If the air in your home is dry, use a humidifier.  No Aspirin  : Do not use aspirin for treatment of fever or pain (Reason: there is an association between influenza and Reye syndrome).  Isolation is Needed Until After the Fever is Gone:   The CDC recommends that people with influenza-like illness remain at home until at least 24 hours after they are free of fever (100 F or 37.8C).  Do NOT go to work or school.  Do NOT go to church, child care centers, shopping, or other public places.  Do NOT shake hands.  Avoid close contact with others (hugging, kissing).  Call Back If:  Fever lasts more than 3 days  Runny nose lasts more than 10 days  Cough lasts more than 3 weeks  You become short of breath or worse.  For a Runny Nose With Profuse Discharge:   Nasal mucus and discharge helps to wash viruses and bacteria out of the nose and sinuses.  Blowing the nose is all that is needed.  If the skin around your nostrils gets irritated, apply a tiny amount of petroleum ointment to the nasal openings once or twice a day.  For all Fevers  Drink cold fluids to prevent dehydration.  Dress in 1 layer of lightweight clothing and sleep with 1 light blanket.  For fevers less than 101 F (38.3 C), fever medicines are usually not necessary.  Appointment Scheduled:  04/11/2012 11:15:00 Appointment  Scheduled Provider:  Orville Govern

## 2012-04-11 NOTE — Assessment & Plan Note (Signed)
Patient with fever since Sunday, cough, cervical lymphadenopathy. Culture negative for influenza. Start Azithromycin. She is using her Neti pot. May use Afrin for 3 days if needed. Robitussin DM or Delsym for cough. RTC if no improvement by Monday.

## 2012-04-11 NOTE — Progress Notes (Signed)
  Subjective:    Patient ID: Kristine Hendricks, female    DOB: 04-Jan-1966, 47 y.o.   MRN: [id number]  HPI  Patient is a 47 y/o female who presents with c/o fever, cough, congestion which started this past Sunday. She describes feeling like she "got hit by a truck". She reports a flu exposure last week. Patient did receive her flu vaccine this season. Denies shortness of breath, CP.   Current Outpatient Prescriptions on File Prior to Visit  Medication Sig Dispense Refill  . CALCIUM PO Take by mouth daily.        Marland Kitchen losartan (COZAAR) 25 MG tablet Take 1 tablet (25 mg total) by mouth daily.  90 tablet  3  . Multiple Vitamin (MULTIVITAMIN PO) Take by mouth daily.        . pantoprazole (PROTONIX) 40 MG tablet Take 1 tablet (40 mg total) by mouth daily.  90 tablet  1       Review of Systems  Constitutional: Positive for fever, chills and fatigue.  HENT: Positive for congestion. Negative for sore throat.        Bilateral ear fullness  Eyes: Negative.   Respiratory: Positive for cough and wheezing. Negative for chest tightness.   Cardiovascular: Negative.   Gastrointestinal: Negative.   Genitourinary: Negative.   Musculoskeletal:       General aches and pains.  Neurological: Negative.   Psychiatric/Behavioral: Negative.     BP 163/95  Pulse 77  Temp 99.1 F (37.3 C) (Oral)  Ht 5\' 4"  (1.626 m)  Wt 250 lb (113.399 kg)  BMI 42.91 kg/m2  SpO2 98%  LMP 06/11/2003     Objective:   Physical Exam  Constitutional: She is oriented to person, place, and time. She appears well-developed and well-nourished. No distress.  HENT:  Head: Normocephalic and atraumatic.       Right ear canal erythematous.  Neck: Normal range of motion.  Cardiovascular: Normal rate, regular rhythm and normal heart sounds.  Exam reveals no gallop.   No murmur heard. Pulmonary/Chest: Effort normal. No respiratory distress. She has wheezes.       Positive for rhonchi RUL. Does not clear with cough.  Abdominal:  Soft. Bowel sounds are normal.  Musculoskeletal: Normal range of motion.  Lymphadenopathy:    She has cervical adenopathy.  Neurological: She is alert and oriented to person, place, and time.  Skin: Skin is warm and dry.  Psychiatric: She has a normal mood and affect. Her behavior is normal. Judgment and thought content normal.       Assessment & Plan:

## 2012-04-11 NOTE — Telephone Encounter (Signed)
Noted  

## 2012-04-11 NOTE — Patient Instructions (Addendum)
  Your influenza test was negative  Make certain to stay hydrated.  Please start your antibiotic today and take for 7 days.  Flush sinuses daily with your Netti pot  You can use Afrin nasal spray for severe congestion for 3 days. Please do not use longer than 3 days because it may cause rebound stuffiness.   For cough you can try some over the counter Robitussin DM or Delsym.  Please call if you are not feeling any better by Monday.

## 2012-04-18 ENCOUNTER — Ambulatory Visit (INDEPENDENT_AMBULATORY_CARE_PROVIDER_SITE_OTHER): Payer: 59 | Admitting: Physician Assistant

## 2012-04-18 ENCOUNTER — Encounter (INDEPENDENT_AMBULATORY_CARE_PROVIDER_SITE_OTHER): Payer: Self-pay

## 2012-04-18 ENCOUNTER — Encounter (INDEPENDENT_AMBULATORY_CARE_PROVIDER_SITE_OTHER): Payer: Self-pay | Admitting: *Deleted

## 2012-04-18 VITALS — BP 128/82 | HR 68 | Temp 96.8°F | Ht 64.0 in | Wt 261.0 lb

## 2012-04-18 DIAGNOSIS — Z4651 Encounter for fitting and adjustment of gastric lap band: Secondary | ICD-10-CM

## 2012-04-18 NOTE — Patient Instructions (Signed)
Take clear liquids tonight. Thin protein shakes are ok to start tomorrow morning. Slowly advance your diet thereafter. Call us if you have persistent vomiting or regurgitation, night cough or reflux symptoms. Return as scheduled or sooner if you notice no changes in hunger/portion sizes.  

## 2012-04-18 NOTE — Telephone Encounter (Signed)
This encounter was created in error - please disregard.

## 2012-04-18 NOTE — Progress Notes (Signed)
  HISTORY: Kristine Hendricks is a 47 y.o.female who received an AP-Large lap-band in November 2010 by Dr. Daphine Deutscher. She comes in to have fluid replaced after a lap-band holiday due to reflux. She feels much better and is ready to get back on track with weight loss. She is hungry and her portion sizes are larger than desired.  VITAL SIGNS: Filed Vitals:   04/18/12 1352  BP: 128/82  Pulse: 68  Temp: 96.8 F (36 C)    PHYSICAL EXAM: Physical exam reveals a very well-appearing 47 y.o.female in no apparent distress Neurologic: Awake, alert, oriented Psych: Bright affect, conversant Respiratory: Breathing even and unlabored. No stridor or wheezing Abdomen: Soft, nontender, nondistended to palpation. Incisions well-healed. No incisional hernias. Port easily palpated. Extremities: Atraumatic, good range of motion.  ASSESMENT: 47 y.o.  female  s/p AP-Large lap-band.   PLAN: The patient's port was accessed with a 20G Huber needle without difficulty. Clear fluid was aspirated and 2.5 mL saline was added to the port to give a total predicted volume of 5.5 mL. The patient was able to swallow water without difficulty following the procedure and was instructed to take clear liquids for the next 24-48 hours and advance slowly as tolerated.

## 2012-05-04 ENCOUNTER — Other Ambulatory Visit: Payer: Self-pay

## 2012-05-22 ENCOUNTER — Ambulatory Visit: Payer: 59 | Admitting: Internal Medicine

## 2012-05-30 ENCOUNTER — Encounter (INDEPENDENT_AMBULATORY_CARE_PROVIDER_SITE_OTHER): Payer: 59

## 2012-06-03 ENCOUNTER — Ambulatory Visit (INDEPENDENT_AMBULATORY_CARE_PROVIDER_SITE_OTHER): Payer: 59 | Admitting: Internal Medicine

## 2012-06-03 ENCOUNTER — Encounter: Payer: Self-pay | Admitting: Internal Medicine

## 2012-06-03 VITALS — BP 130/90 | HR 61 | Temp 98.8°F | Ht 64.0 in | Wt 254.0 lb

## 2012-06-03 DIAGNOSIS — K219 Gastro-esophageal reflux disease without esophagitis: Secondary | ICD-10-CM

## 2012-06-03 DIAGNOSIS — N951 Menopausal and female climacteric states: Secondary | ICD-10-CM

## 2012-06-03 DIAGNOSIS — E7800 Pure hypercholesterolemia, unspecified: Secondary | ICD-10-CM

## 2012-06-03 DIAGNOSIS — I10 Essential (primary) hypertension: Secondary | ICD-10-CM

## 2012-06-03 DIAGNOSIS — G4730 Sleep apnea, unspecified: Secondary | ICD-10-CM

## 2012-06-03 MED ORDER — VENLAFAXINE HCL ER 37.5 MG PO CP24: 37.5000 mg | ORAL_CAPSULE | Freq: Every day | ORAL | Status: DC

## 2012-06-03 MED ORDER — LOSARTAN POTASSIUM 50 MG PO TABS: 50.0000 mg | ORAL_TABLET | Freq: Every day | ORAL | Status: DC

## 2012-06-09 ENCOUNTER — Encounter: Payer: Self-pay | Admitting: Internal Medicine

## 2012-06-09 DIAGNOSIS — N951 Menopausal and female climacteric states: Secondary | ICD-10-CM | POA: Insufficient documentation

## 2012-06-09 NOTE — Assessment & Plan Note (Signed)
Had been doing well with her weight loss.  Follow.

## 2012-06-09 NOTE — Progress Notes (Signed)
Subjective:    Patient ID: Kristine Hendricks, female    DOB: 21-May-1965, 47 y.o.   MRN: [id number]  HPI 46 year old female with past history of hypertension, sleep apnea and hypercholesterolemia who is s/p lap band surgery with significant weight loss.  Swallowing better. Continuing on Protonix and Zantac.  Just had a fill on 04/18/12.  Tolerating.   No cardiac symptoms with increased activity or exertion.  Breathing stable.  She does report now having hot flashes.  Occasionally during the day, but mostly notices at night.  Increased stress at work.  She is more emotional.  Not sleeping as well.  Needs something to help her.  Does not want to take estrogen.  Blood pressure a little elevated recently.  She relates it to the above.  Had been doing better.  Only on 25mg  of Losartan.       Past Medical History  Diagnosis Date  . Hypertension   . Hypercholesterolemia   . Seasonal allergies   . Sleep apnea   . History of chicken pox   . Asthma   . GERD (gastroesophageal reflux disease)   . Anemia    Current Outpatient Prescriptions on File Prior to Visit  Medication Sig Dispense Refill  . CALCIUM PO Take 300 mg by mouth 4 (four) times daily.       . Multiple Vitamin (MULTIVITAMIN PO) Take by mouth daily.        . pantoprazole (PROTONIX) 40 MG tablet Take 1 tablet (40 mg total) by mouth daily.  90 tablet  1   No current facility-administered medications on file prior to visit.    Review of Systems Patient denies any headache, lightheadedness or dizziness.  No sinus or allergy symptoms.  No throat irritation.   No chest pain, tightness or palpitations.  No increased shortness of breath.  No swallowing problems now.   No abdominal pain or cramping.  No bowel change, such as diarrhea, constipation, BRBPR or melana.  No urine change.   Had a fill 04/18/12.  Increased hot flashes and mood change as outlined above.  Blood pressure as outlined.  Increased stress at work.  Not sleeping as well.       Objective:   Physical Exam  Filed Vitals:   06/03/12 1020  BP: 130/90  Pulse: 61  Temp: 98.8 F (40.89 C)   47 year old female in no acute distress.   HEENT:  Nares - clear.  OP- without lesions or erythema.   NECK:  Supple, nontender.  No audible bruit.   HEART:  Appears to be regular. LUNGS:  Without crackles or wheezing audible.  Respirations even and unlabored.   RADIAL PULSE:  Equal bilaterally.  ABDOMEN:  Soft, nontender.  No audible abdominal bruit.   EXTREMITIES:  No increased edema to be present.                     Assessment & Plan:  PREVIOUS THROAT IRRITATION.  Resolved.  Continue on Protonix.  Tolerated a recent fill.  Follow.   INCREASED PSYCHOSOCIAL STRESSORS.  More emotional now.  Hot flashes.  Not sleeping well.  Increased stress at work.  Will start Effexor XR 37.5mg  q day.  Should help with the hot flashes and the mood changes.  Will need to follow blood pressure closely.  Get her back in soon to reassess.   HEALTH MAINTENANCE.  Mammogram 12/12/11 - BiRADS II.    Physical 08/10/11.  She is s/p  hysterectomy.  Colonoscopy 08/12/10 - normal.

## 2012-06-09 NOTE — Assessment & Plan Note (Signed)
On Losartan.  Blood pressure as outlined.  Will increase to 50mg  q day.  Follow.

## 2012-06-09 NOTE — Assessment & Plan Note (Signed)
Symptoms improved after port accessed and saline removed.  Continue on Protonix and Zantac.  Follow.  Just had a fill 04/18/12.  Tolerating.

## 2012-06-09 NOTE — Assessment & Plan Note (Signed)
Low cholesterol diet and exercise.  Follow lipid profile.    

## 2012-06-09 NOTE — Assessment & Plan Note (Signed)
With the hot flashes and mood changes as outlined.  Discussed various treatment options.  She wants to avoid estrogen.  Will start her on effexor xr 37.5 mg q day.  Follow blood pressure closely.  Get her back in soon to reassess.

## 2012-07-04 ENCOUNTER — Ambulatory Visit (INDEPENDENT_AMBULATORY_CARE_PROVIDER_SITE_OTHER): Payer: 59 | Admitting: Internal Medicine

## 2012-07-04 ENCOUNTER — Encounter: Payer: Self-pay | Admitting: Internal Medicine

## 2012-07-04 VITALS — BP 130/82 | HR 63 | Temp 98.7°F | Ht 64.0 in | Wt 263.2 lb

## 2012-07-04 DIAGNOSIS — K219 Gastro-esophageal reflux disease without esophagitis: Secondary | ICD-10-CM

## 2012-07-04 DIAGNOSIS — I10 Essential (primary) hypertension: Secondary | ICD-10-CM

## 2012-07-04 DIAGNOSIS — E7800 Pure hypercholesterolemia, unspecified: Secondary | ICD-10-CM

## 2012-07-04 DIAGNOSIS — N951 Menopausal and female climacteric states: Secondary | ICD-10-CM

## 2012-07-04 DIAGNOSIS — L608 Other nail disorders: Secondary | ICD-10-CM

## 2012-07-04 DIAGNOSIS — G4730 Sleep apnea, unspecified: Secondary | ICD-10-CM

## 2012-07-04 MED ORDER — VENLAFAXINE HCL ER 37.5 MG PO CP24: 37.5000 mg | ORAL_CAPSULE | Freq: Every day | ORAL | Status: DC

## 2012-07-06 ENCOUNTER — Encounter: Payer: Self-pay | Admitting: Internal Medicine

## 2012-07-06 NOTE — Assessment & Plan Note (Signed)
With the hot flashes and mood changes as outlined in previous note.  Discussed various treatment options.  She wanted to avoid estrogen.  Was started on effexor xr 37.5 mg q day.  Tolerating and doing well.  Feels better.  Follow.

## 2012-07-06 NOTE — Assessment & Plan Note (Signed)
Symptoms improved after port accessed and saline removed.  Continue on Protonix and Zantac.  Follow.  Had a fill 04/18/12.  Tolerating.  Currently asymptomatic.

## 2012-07-06 NOTE — Assessment & Plan Note (Signed)
On Losartan 50mg  q day.  Blood pressures on outside checks -  134/79, 132/79, 136/79, 121/82, 129/79, 126/76.  Continue current med regimen.  Follow metabolic panel.

## 2012-07-06 NOTE — Assessment & Plan Note (Signed)
Has done well with her weight loss.  Follow.

## 2012-07-06 NOTE — Assessment & Plan Note (Signed)
Low cholesterol diet and exercise.  Follow lipid profile.    

## 2012-07-06 NOTE — Progress Notes (Signed)
  Subjective:    Patient ID: Kristine Hendricks, female    DOB: 1965-05-09, 47 y.o.   MRN: [id number]  HPI 47 year old female with past history of hypertension, sleep apnea and hypercholesterolemia who is s/p lap band surgery with significant weight loss.  She comes in today for a scheduled follow up.  Swallowing better. Continuing on Protonix and Zantac.  Just had a fill on 04/18/12.  Tolerating.   No cardiac symptoms with increased activity or exertion.  Breathing stable.  Taking effexor.  Feels better.  Hot flashes better.  Mood better.  Blood pressures as outlined in problem list.        Past Medical History  Diagnosis Date  . Hypertension   . Hypercholesterolemia   . Seasonal allergies   . Sleep apnea   . History of chicken pox   . Asthma   . GERD (gastroesophageal reflux disease)   . Anemia     Current Outpatient Prescriptions on File Prior to Visit  Medication Sig Dispense Refill  . CALCIUM PO Take 300 mg by mouth 4 (four) times daily.       Marland Kitchen losartan (COZAAR) 50 MG tablet Take 1 tablet (50 mg total) by mouth daily.  90 tablet  1  . Multiple Vitamin (MULTIVITAMIN PO) Take by mouth daily.        . pantoprazole (PROTONIX) 40 MG tablet Take 1 tablet (40 mg total) by mouth daily.  90 tablet  1   No current facility-administered medications on file prior to visit.    Review of Systems Patient denies any headache, lightheadedness or dizziness.  No sinus or allergy symptoms.  No throat irritation.  No acid reflux.   No chest pain, tightness or palpitations.  No increased shortness of breath.  No swallowing problems now.   No abdominal pain or cramping.  No bowel change, such as diarrhea, constipation, BRBPR or melana.  No urine change.   Had a fill 04/18/12.  Started on effexor last visit.  Doing well.  Tolerating well.  Blood pressure doing well.  Hot flashes better.  Mood better.  Sleeping better.  Still with a black toe nail.  Request referral to podiatry.      Objective:   Physical  Exam  Filed Vitals:   07/04/12 0932  BP: 130/82  Pulse: 63  Temp: 98.7 F (37.1 C)   Blood pressure recheck:  132/84, pulse 43  47 year old female in no acute distress.   HEENT:  Nares - clear.  OP- without lesions or erythema.   NECK:  Supple, nontender.  No audible bruit.   HEART:  Appears to be regular. LUNGS:  Without crackles or wheezing audible.  Respirations even and unlabored.   RADIAL PULSE:  Equal bilaterally.  ABDOMEN:  Soft, nontender.  No audible abdominal bruit.   EXTREMITIES:  No increased edema to be present.                     Assessment & Plan:  PREVIOUS THROAT IRRITATION.  Resolved.  Continue on Protonix.  Tolerated a recent fill.  Follow.   INCREASED PSYCHOSOCIAL STRESSORS.  Doing well on Effexor.  Follow.  Sleeping better.  Mood better.    PODIATRY.  Persistent black toe nail - left great toe.  Request referral to podiatry.    HEALTH MAINTENANCE.  Mammogram 12/12/11 - BiRADS II.    Physical 08/10/11.  She is s/p hysterectomy.  Colonoscopy 08/12/10 - normal.

## 2012-10-04 ENCOUNTER — Ambulatory Visit: Payer: 59 | Admitting: Internal Medicine

## 2012-10-14 ENCOUNTER — Ambulatory Visit: Payer: 59 | Admitting: Internal Medicine

## 2012-10-26 ENCOUNTER — Other Ambulatory Visit: Payer: Self-pay | Admitting: Internal Medicine

## 2012-10-29 LAB — PLEASE NOTE

## 2012-10-30 ENCOUNTER — Encounter: Payer: Self-pay | Admitting: Internal Medicine

## 2012-10-30 ENCOUNTER — Ambulatory Visit (INDEPENDENT_AMBULATORY_CARE_PROVIDER_SITE_OTHER): Payer: 59 | Admitting: Internal Medicine

## 2012-10-30 VITALS — BP 130/90 | HR 62 | Temp 98.8°F | Ht 64.0 in | Wt 270.5 lb

## 2012-10-30 DIAGNOSIS — G4730 Sleep apnea, unspecified: Secondary | ICD-10-CM

## 2012-10-30 DIAGNOSIS — E7800 Pure hypercholesterolemia, unspecified: Secondary | ICD-10-CM

## 2012-10-30 DIAGNOSIS — I10 Essential (primary) hypertension: Secondary | ICD-10-CM

## 2012-10-30 DIAGNOSIS — N951 Menopausal and female climacteric states: Secondary | ICD-10-CM

## 2012-10-30 DIAGNOSIS — K219 Gastro-esophageal reflux disease without esophagitis: Secondary | ICD-10-CM

## 2012-10-30 LAB — BASIC METABOLIC PANEL
CO2: 26 mmol/L (ref 18–29)
Calcium: 9.1 mg/dL (ref 8.7–10.2)
Creatinine, Ser: 0.64 mg/dL (ref 0.57–1.00)
GFR calc non Af Amer: 106 mL/min/{1.73_m2} (ref >59)
Glucose: 74 mg/dL (ref 65–99)
Potassium: 4.7 mmol/L (ref 3.5–5.2)

## 2012-10-30 LAB — SPECIMEN STATUS REPORT

## 2012-10-30 MED ORDER — LOSARTAN POTASSIUM 100 MG PO TABS: 100.0000 mg | ORAL_TABLET | Freq: Every day | ORAL | Status: DC

## 2012-10-30 MED ORDER — VENLAFAXINE HCL ER 37.5 MG PO CP24: 37.5000 mg | ORAL_CAPSULE | Freq: Every day | ORAL | Status: DC

## 2012-10-30 MED ORDER — PANTOPRAZOLE SODIUM 40 MG PO TBEC: 40.0000 mg | DELAYED_RELEASE_TABLET | Freq: Every day | ORAL | Status: DC

## 2012-11-01 ENCOUNTER — Encounter: Payer: Self-pay | Admitting: Internal Medicine

## 2012-11-01 NOTE — Assessment & Plan Note (Signed)
On effexor and doing better.  Follow.

## 2012-11-01 NOTE — Assessment & Plan Note (Signed)
Off protonix.  Throat burning as outlined.  Restart protonix.  Get her back in soon to reassess.

## 2012-11-01 NOTE — Assessment & Plan Note (Signed)
Had been doing well with her weight loss.  Follow.

## 2012-11-01 NOTE — Assessment & Plan Note (Signed)
Low cholesterol diet and exercise.  Follow lipid profile.    

## 2012-11-01 NOTE — Progress Notes (Signed)
  Subjective:    Patient ID: Kristine Hendricks, female    DOB: 05-19-1965, 47 y.o.   MRN: [id number]  HPI 47 year old female with past history of hypertension, sleep apnea and hypercholesterolemia who is s/p lap band surgery with significant weight loss.  She comes in today for a scheduled follow up.  Swallowing better. Continuing on Protonix and Zantac.  No cardiac symptoms with increased activity or exertion.  Breathing stable.  Taking effexor.  Feels better.  Hot flashes better.  Mood better.  Blood pressures (she reports) averaging 120-130/90s.  She does report some burning in her throat.  Off protonix.  No dysphagia.        Past Medical History  Diagnosis Date  . Hypertension   . Hypercholesterolemia   . Seasonal allergies   . Sleep apnea   . History of chicken pox   . Asthma   . GERD (gastroesophageal reflux disease)   . Anemia     Current Outpatient Prescriptions on File Prior to Visit  Medication Sig Dispense Refill  . CALCIUM PO Take 300 mg by mouth 4 (four) times daily.       . Multiple Vitamin (MULTIVITAMIN PO) Take by mouth daily.         No current facility-administered medications on file prior to visit.    Review of Systems Patient denies any headache, lightheadedness or dizziness.  No sinus or allergy symptoms.  Does describe the burning in her throat.   No actual acid reflux noted.   No chest pain, tightness or palpitations.  No increased shortness of breath.  No abdominal pain or cramping.  No bowel change, such as diarrhea, constipation, BRBPR or melana.  No urine change.  On effexor.  Doing better.  Tolerating well.  Blood pressure doing well.  Hot flashes better.  Mood better.  Sleeping better.      Objective:   Physical Exam  Filed Vitals:   10/30/12 0926  BP: 130/90  Pulse: 62  Temp: 98.8 F (37.1 C)   Blood pressure recheck:  128-130/88, pulse 24  47 year old female in no acute distress.   HEENT:  Nares - clear.  OP- without lesions or erythema.    NECK:  Supple, nontender.  No audible bruit.   HEART:  Appears to be regular. LUNGS:  Without crackles or wheezing audible.  Respirations even and unlabored.   RADIAL PULSE:  Equal bilaterally.  ABDOMEN:  Soft, nontender.  No audible abdominal bruit.   EXTREMITIES:  No increased edema to be present.                     Assessment & Plan:  THROAT IRRITATION/BURNING.  Will restart protonix.  Get her back in soon to reassess.  Follow.     INCREASED PSYCHOSOCIAL STRESSORS.  Doing well on Effexor.  Follow.  Sleeping better.  Mood better.  HEALTH MAINTENANCE.  Mammogram 12/12/11 - BiRADS II.    Physical 08/10/11.  She is s/p hysterectomy.  Colonoscopy 08/12/10 - normal.  Schedule physical next visit.

## 2012-11-01 NOTE — Assessment & Plan Note (Signed)
Diastolic blood pressure remaining elevated.  Will increase losartan to 100mg  q day.  Follow.

## 2012-11-02 ENCOUNTER — Emergency Department: Payer: Self-pay | Admitting: Internal Medicine

## 2012-11-04 ENCOUNTER — Encounter: Payer: Self-pay | Admitting: Internal Medicine

## 2012-11-06 ENCOUNTER — Encounter: Payer: Self-pay | Admitting: Adult Health

## 2012-11-06 ENCOUNTER — Ambulatory Visit (INDEPENDENT_AMBULATORY_CARE_PROVIDER_SITE_OTHER): Payer: Self-pay | Admitting: Adult Health

## 2012-11-06 VITALS — BP 128/80 | HR 55 | Temp 98.2°F | Resp 12 | Wt 270.5 lb

## 2012-11-06 DIAGNOSIS — I10 Essential (primary) hypertension: Secondary | ICD-10-CM

## 2012-11-06 DIAGNOSIS — R519 Headache, unspecified: Secondary | ICD-10-CM | POA: Insufficient documentation

## 2012-11-06 DIAGNOSIS — R51 Headache: Secondary | ICD-10-CM

## 2012-11-06 MED ORDER — ELETRIPTAN HYDROBROMIDE 40 MG PO TABS: 40.0000 mg | ORAL_TABLET | ORAL | Status: DC | PRN

## 2012-11-06 NOTE — Patient Instructions (Addendum)
  Try relpax for your headache. Take 1/2 tablet at the onset of a headache. May repeat in 2 hours if necessary. No more that 2 doses in  24 hours.  Let me know through MyChart if this medication helps your headache.

## 2012-11-06 NOTE — Progress Notes (Signed)
  Subjective:    Patient ID: Kristine Hendricks, female    DOB: Sep 03, 1965, 47 y.o.   MRN: [id number]  HPI  Patient is a pleasant 47 year old female who presents to clinic for followup of recent ED visit for headache. She reports that on Thursday she experienced headache that was not relieved with Tylenol, ibuprofen. The headache remained until Saturday when she decided to go to the emergency department. There was nausea associated with her headache. There was no visual disturbance. She reports having increased stress at work, planning her parents 50th anniversary and other life stressors. In the ED, a CT of the head that was done which was normal. Patient was discharged home with butalbital. She reports this medication makes her feel "drugged".   Current Outpatient Prescriptions on File Prior to Visit  Medication Sig Dispense Refill  . CALCIUM PO Take 300 mg by mouth 4 (four) times daily.       Marland Kitchen losartan (COZAAR) 100 MG tablet Take 1 tablet (100 mg total) by mouth daily.  90 tablet  3  . Multiple Vitamin (MULTIVITAMIN PO) Take by mouth daily.        . pantoprazole (PROTONIX) 40 MG tablet Take 1 tablet (40 mg total) by mouth daily.  90 tablet  1  . venlafaxine XR (EFFEXOR XR) 37.5 MG 24 hr capsule Take 1 capsule (37.5 mg total) by mouth daily.  90 capsule  1   No current facility-administered medications on file prior to visit.     Review of Systems  Constitutional: Negative.   HENT: Negative for congestion and sinus pressure.   Eyes: Negative.   Respiratory: Negative.   Cardiovascular: Negative.   Neurological: Positive for headaches. Negative for dizziness, tremors, speech difficulty, weakness, light-headedness and numbness.  Psychiatric/Behavioral: Negative.      BP 128/80  Pulse 55  Temp(Src) 98.2 F (36.8 C) (Oral)  Resp 12  Wt 270 lb 8 oz (122.698 kg)  BMI 46.41 kg/m2  SpO2 98%  LMP 06/11/2003    Objective:   Physical Exam  Constitutional: She is oriented to person,  place, and time.  47 year old female in no apparent distress  HENT:  Head: Normocephalic and atraumatic.  Eyes: Conjunctivae and EOM are normal. Pupils are equal, round, and reactive to light.  Cardiovascular: Normal rate, regular rhythm and normal heart sounds.  Exam reveals no gallop and no friction rub.   No murmur heard. Pulmonary/Chest: Effort normal and breath sounds normal. No respiratory distress. She has no wheezes. She has no rales. She exhibits no tenderness.  Neurological: She is alert and oriented to person, place, and time.  Skin: Skin is warm and dry.  Psychiatric: She has a normal mood and affect. Her behavior is normal. Judgment and thought content normal.          Assessment & Plan:

## 2012-11-06 NOTE — Assessment & Plan Note (Signed)
Suspect stress headache versus cluster headaches. She was prescribed butalbital the patient feels side effects are uncomfortable during the day. We'll try Relpax. Provided patient with a prescription to fill if she feels medication works. RTC if symptoms worsen or if no relief in symptoms within one week. 

## 2012-11-06 NOTE — Assessment & Plan Note (Deleted)
Suspect stress headache versus cluster headaches. She was prescribed butalbital the patient feels side effects are uncomfortable during the day. We'll try Relpax. Provided patient with a prescription to fill if she feels medication works. RTC if symptoms worsen or if no relief in symptoms within one week.

## 2012-11-07 ENCOUNTER — Encounter: Payer: Self-pay | Admitting: Adult Health

## 2012-11-07 ENCOUNTER — Other Ambulatory Visit: Payer: Self-pay | Admitting: Adult Health

## 2012-11-07 MED ORDER — ELETRIPTAN HYDROBROMIDE 40 MG PO TABS: ORAL_TABLET | ORAL | Status: DC

## 2012-12-23 ENCOUNTER — Encounter: Payer: Self-pay | Admitting: Internal Medicine

## 2013-01-01 ENCOUNTER — Ambulatory Visit: Payer: Self-pay | Admitting: Internal Medicine

## 2013-01-17 ENCOUNTER — Encounter: Payer: Self-pay | Admitting: Internal Medicine

## 2013-01-23 ENCOUNTER — Other Ambulatory Visit: Payer: Self-pay

## 2013-02-10 ENCOUNTER — Other Ambulatory Visit: Payer: Self-pay | Admitting: Internal Medicine

## 2013-02-17 ENCOUNTER — Encounter: Payer: 59 | Admitting: Internal Medicine

## 2013-04-24 ENCOUNTER — Encounter: Payer: Self-pay | Admitting: Internal Medicine

## 2013-04-24 ENCOUNTER — Ambulatory Visit (INDEPENDENT_AMBULATORY_CARE_PROVIDER_SITE_OTHER): Payer: 59 | Admitting: Internal Medicine

## 2013-04-24 VITALS — BP 120/90 | HR 55 | Temp 97.9°F | Ht 64.5 in | Wt 285.2 lb

## 2013-04-24 DIAGNOSIS — K219 Gastro-esophageal reflux disease without esophagitis: Secondary | ICD-10-CM

## 2013-04-24 DIAGNOSIS — R5381 Other malaise: Secondary | ICD-10-CM

## 2013-04-24 DIAGNOSIS — I10 Essential (primary) hypertension: Secondary | ICD-10-CM

## 2013-04-24 DIAGNOSIS — R5383 Other fatigue: Secondary | ICD-10-CM

## 2013-04-24 DIAGNOSIS — E7800 Pure hypercholesterolemia, unspecified: Secondary | ICD-10-CM

## 2013-04-24 MED ORDER — LOSARTAN POTASSIUM 100 MG PO TABS: 100.0000 mg | ORAL_TABLET | Freq: Every day | ORAL | Status: DC

## 2013-04-24 MED ORDER — VENLAFAXINE HCL ER 37.5 MG PO CP24: 37.5000 mg | ORAL_CAPSULE | Freq: Every day | ORAL | Status: DC

## 2013-04-24 MED ORDER — PANTOPRAZOLE SODIUM 40 MG PO TBEC: 40.0000 mg | DELAYED_RELEASE_TABLET | Freq: Every day | ORAL | Status: DC

## 2013-04-28 ENCOUNTER — Encounter: Payer: Self-pay | Admitting: Internal Medicine

## 2013-04-28 NOTE — Progress Notes (Signed)
  Subjective:    Patient ID: Kristine Hendricks, female    DOB: [birth year], 48 y.o.   MRN: [id number]  HPI 48 year old female with past history of hypertension, sleep apnea and hypercholesterolemia who is s/p lap band surgery with significant weight loss.  She comes in today to follow up on these issues as well as for a complete physical exam.  Swallowing better. Continuing on Protonix and Zantac.  No cardiac symptoms with increased activity or exertion.  Breathing stable.  Taking effexor.  Feels better.  Hot flashes better.  Mood better.  Blood pressures (she reports) averaging 120-130/70-80s.   No dysphagia.   She does report some fatigue.  Had stopped watching her diet.  Had got out of her exercise routine.  Is going back to the gym now.  Planning to get more serious about her diet.       Past Medical History  Diagnosis Date  . Hypertension   . Hypercholesterolemia   . Seasonal allergies   . Sleep apnea   . History of chicken pox   . Asthma   . GERD (gastroesophageal reflux disease)   . Anemia     Current Outpatient Prescriptions on File Prior to Visit  Medication Sig Dispense Refill  . CALCIUM PO Take 300 mg by mouth 4 (four) times daily.       . Multiple Vitamin (MULTIVITAMIN PO) Take by mouth daily.         No current facility-administered medications on file prior to visit.    Review of Systems Patient denies any headache, lightheadedness or dizziness.  No sinus or allergy symptoms.  No swallowing problems.  No acid reflux.   No chest pain, tightness or palpitations.  No increased shortness of breath.  No abdominal pain or cramping.  No bowel change, such as diarrhea, constipation, BRBPR or melana.  No urine change.  On effexor.  Doing well on effexor.  Tolerating well.  Blood pressure doing well on her outside checks.   Hot flashes better.  Mood better.  Sleeping better.   Planning to get back into her exercise routine.  Planning to watch her diet more.       Objective:   Physical  Exam  Filed Vitals:   04/24/13 1206  BP: 120/90  Pulse: 55  Temp: 97.9 F (36.6 C)   Blood pressure recheck:  34/33  48 year old female in no acute distress.   HEENT:  Nares- clear.  Oropharynx - without lesions. NECK:  Supple.  Nontender.  No audible bruit.  HEART:  Appears to be regular. LUNGS:  No crackles or wheezing audible.  Respirations even and unlabored.  RADIAL PULSE:  Equal bilaterally.    BREASTS:  No nipple discharge or nipple retraction present.  Could not appreciate any distinct nodules or axillary adenopathy.  ABDOMEN:  Soft, nontender.  Bowel sounds present and normal.  No audible abdominal bruit.  GU:  Not performed.     EXTREMITIES:  No increased edema present.  DP pulses palpable and equal bilaterally.           Assessment & Plan:  INCREASED PSYCHOSOCIAL STRESSORS.  Doing well on Effexor.  Follow.  Sleeping better.  Mood better.  HEALTH MAINTENANCE.  Mammogram 01/01/13 - BiRADS I.    Physical today.  She is s/p hysterectomy.  Colonoscopy 08/12/10 - normal.

## 2013-04-29 ENCOUNTER — Encounter: Payer: Self-pay | Admitting: Internal Medicine

## 2013-04-29 DIAGNOSIS — R5383 Other fatigue: Secondary | ICD-10-CM | POA: Insufficient documentation

## 2013-04-29 DIAGNOSIS — R5381 Other malaise: Secondary | ICD-10-CM | POA: Insufficient documentation

## 2013-04-29 NOTE — Assessment & Plan Note (Signed)
Low cholesterol diet and exercise.  Follow lipid profile.    

## 2013-04-29 NOTE — Assessment & Plan Note (Signed)
Check cbc, met c and tsh.  Probably multifactorial.  Getting back in her exercise routine.  Follow.

## 2013-04-29 NOTE — Assessment & Plan Note (Signed)
Blood pressure as outlined.  Continue same medication regimen.  Follow metabolic panel.   

## 2013-04-29 NOTE — Assessment & Plan Note (Signed)
On protonix. No swallowing issues.  Follow.

## 2013-06-04 ENCOUNTER — Other Ambulatory Visit: Payer: Self-pay | Admitting: Internal Medicine

## 2013-06-05 ENCOUNTER — Telehealth: Payer: Self-pay | Admitting: Internal Medicine

## 2013-06-05 LAB — COMPREHENSIVE METABOLIC PANEL
A/G RATIO: 2 (ref 1.1–2.5)
ALBUMIN: 4.3 g/dL (ref 3.5–5.5)
ALT: 28 IU/L (ref 0–32)
AST: 26 IU/L (ref 0–40)
Alkaline Phosphatase: 89 IU/L (ref 39–117)
BILIRUBIN TOTAL: 0.5 mg/dL (ref 0.0–1.2)
BUN/Creatinine Ratio: 21 (ref 9–23)
BUN: 15 mg/dL (ref 6–24)
CALCIUM: 9.2 mg/dL (ref 8.7–10.2)
CHLORIDE: 102 mmol/L (ref 97–108)
CO2: 25 mmol/L (ref 18–29)
Creatinine, Ser: 0.71 mg/dL (ref 0.57–1.00)
GFR, EST AFRICAN AMERICAN: 116 mL/min/{1.73_m2} (ref >59)
GFR, EST NON AFRICAN AMERICAN: 101 mL/min/{1.73_m2} (ref >59)
Globulin, Total: 2.1 g/dL (ref 1.5–4.5)
Glucose: 77 mg/dL (ref 65–99)
POTASSIUM: 4.5 mmol/L (ref 3.5–5.2)
Sodium: 142 mmol/L (ref 134–144)
Total Protein: 6.4 g/dL (ref 6.0–8.5)

## 2013-06-05 LAB — LIPID PANEL W/O CHOL/HDL RATIO
CHOLESTEROL TOTAL: 217 mg/dL — AB (ref 100–199)
HDL: 49 mg/dL (ref >39)
LDL CALC: 149 mg/dL — AB (ref 0–99)
Triglycerides: 94 mg/dL (ref 0–149)
VLDL CHOLESTEROL CAL: 19 mg/dL (ref 5–40)

## 2013-06-05 LAB — CBC WITH DIFFERENTIAL
BASOS: 1 %
Basophils Absolute: 0.1 10*3/uL (ref 0.0–0.2)
Eos: 2 %
Eosinophils Absolute: 0.2 10*3/uL (ref 0.0–0.4)
HEMATOCRIT: 41.6 % (ref 34.0–46.6)
Hemoglobin: 13.9 g/dL (ref 11.1–15.9)
IMMATURE GRANS (ABS): 0 10*3/uL (ref 0.0–0.1)
Immature Granulocytes: 0 %
LYMPHS: 22 %
Lymphocytes Absolute: 1.7 10*3/uL (ref 0.7–3.1)
MCH: 28.7 pg (ref 26.6–33.0)
MCHC: 33.4 g/dL (ref 31.5–35.7)
MCV: 86 fL (ref 79–97)
MONOCYTES: 9 %
Monocytes Absolute: 0.7 10*3/uL (ref 0.1–0.9)
NEUTROS ABS: 5 10*3/uL (ref 1.4–7.0)
NEUTROS PCT: 66 %
Platelets: 267 10*3/uL (ref 150–379)
RBC: 4.84 x10E6/uL (ref 3.77–5.28)
RDW: 13.7 % (ref 12.3–15.4)
WBC: 7.6 10*3/uL (ref 3.4–10.8)

## 2013-06-05 LAB — TSH: TSH: 3.53 u[IU]/mL (ref 0.450–4.500)

## 2013-06-05 NOTE — Telephone Encounter (Signed)
Pt notified of lab results via mychart. 

## 2013-08-04 ENCOUNTER — Ambulatory Visit: Payer: 59 | Admitting: Internal Medicine

## 2013-08-25 ENCOUNTER — Ambulatory Visit: Payer: 59 | Admitting: Internal Medicine

## 2013-09-25 ENCOUNTER — Ambulatory Visit: Payer: 59 | Admitting: Internal Medicine

## 2013-11-16 LAB — LIPID PANEL
CHOLESTEROL: 223 mg/dL — AB (ref 0–200)
HDL: 57 mg/dL (ref 35–70)
LDL CALC: 143 mg/dL
LDl/HDL Ratio: 3.9
Triglycerides: 113 mg/dL (ref 40–160)

## 2013-11-16 LAB — HEMOGLOBIN A1C: HEMOGLOBIN A1C: 5.1 % (ref 4.0–6.0)

## 2013-11-16 LAB — BASIC METABOLIC PANEL
Creatinine: 0.8 mg/dL (ref 0.5–1.1)
GLUCOSE: 87 mg/dL

## 2013-12-23 ENCOUNTER — Encounter: Payer: Self-pay | Admitting: Internal Medicine

## 2013-12-26 ENCOUNTER — Encounter: Payer: Self-pay | Admitting: Internal Medicine

## 2013-12-26 LAB — LIPID PANEL
Cholesterol: 215 mg/dL — AB (ref 0–200)
HDL: 50 mg/dL (ref 35–70)
LDL Cholesterol: 141 mg/dL
Triglycerides: 119 mg/dL (ref 40–160)

## 2013-12-26 LAB — BASIC METABOLIC PANEL
BUN: 16 mg/dL (ref 4–21)
Creatinine: 0.8 mg/dL (ref 0.5–1.1)
Glucose: 95 mg/dL
POTASSIUM: 4.4 mmol/L (ref 3.4–5.3)
SODIUM: 143 mmol/L (ref 137–147)

## 2013-12-26 LAB — HEPATIC FUNCTION PANEL
ALT: 8 U/L (ref 7–35)
AST: 15 U/L (ref 13–35)
Alkaline Phosphatase: 75 U/L (ref 25–125)
BILIRUBIN, TOTAL: 0.4 mg/dL

## 2013-12-26 LAB — CBC AND DIFFERENTIAL
HEMATOCRIT: 43 % (ref 36–46)
Hemoglobin: 14.6 g/dL (ref 12.0–16.0)
Neutrophils Absolute: 5 /uL
Platelets: 307 10*3/uL (ref 150–399)
WBC: 7.6 10^3/mL

## 2013-12-26 LAB — TSH: TSH: 3.81 u[IU]/mL (ref 0.41–5.90)

## 2013-12-29 ENCOUNTER — Ambulatory Visit (INDEPENDENT_AMBULATORY_CARE_PROVIDER_SITE_OTHER): Payer: 59 | Admitting: Internal Medicine

## 2013-12-29 ENCOUNTER — Encounter: Payer: Self-pay | Admitting: Internal Medicine

## 2013-12-29 VITALS — BP 120/82 | HR 60 | Temp 98.9°F | Ht 64.5 in | Wt 287.0 lb

## 2013-12-29 DIAGNOSIS — R5383 Other fatigue: Secondary | ICD-10-CM

## 2013-12-29 DIAGNOSIS — K219 Gastro-esophageal reflux disease without esophagitis: Secondary | ICD-10-CM

## 2013-12-29 DIAGNOSIS — G4730 Sleep apnea, unspecified: Secondary | ICD-10-CM

## 2013-12-29 DIAGNOSIS — E7800 Pure hypercholesterolemia, unspecified: Secondary | ICD-10-CM

## 2013-12-29 DIAGNOSIS — R1111 Vomiting without nausea: Secondary | ICD-10-CM

## 2013-12-29 DIAGNOSIS — I10 Essential (primary) hypertension: Secondary | ICD-10-CM

## 2013-12-29 NOTE — Patient Instructions (Addendum)
Ranitidine 150mg  - take one before bedtime

## 2013-12-29 NOTE — Progress Notes (Signed)
Pre visit review using our clinic review tool, if applicable. No additional management support is needed unless otherwise documented below in the visit note. 

## 2014-01-04 NOTE — Assessment & Plan Note (Signed)
Blood pressure as outlined.  Continue same medication regimen.  Follow metabolic panel.   

## 2014-01-04 NOTE — Progress Notes (Signed)
Subjective:    Patient ID: Kristine Hendricks, female    DOB: 1965/07/13, 48 y.o.   MRN: [id number]  HPI 48 year old female with past history of hypertension, sleep apnea and hypercholesterolemia who is s/p lap band surgery with significant weight loss.  She comes in today for a scheduled follow up.  Swallowing better. Continuing on Protonix and Zantac.  Still with acid reflux despite medication.  Persistent and worsening problems.   No cardiac symptoms with increased activity or exertion.  Breathing stable.  Taking effexor.  Feels better.  Hot flashes better.  Mood better.  She reports blood pressure is doing well.  No dysphagia.  Is going to the gym.  Watching her diet. Frustrated because she is not losing weight.        Past Medical History  Diagnosis Date  . Hypertension   . Hypercholesterolemia   . Seasonal allergies   . Sleep apnea   . History of chicken pox   . Asthma   . GERD (gastroesophageal reflux disease)   . Anemia     Current Outpatient Prescriptions on File Prior to Visit  Medication Sig Dispense Refill  . CALCIUM PO Take 300 mg by mouth 4 (four) times daily.       Marland Kitchen losartan (COZAAR) 100 MG tablet Take 1 tablet (100 mg total) by mouth daily.  90 tablet  3  . Multiple Vitamin (MULTIVITAMIN PO) Take by mouth daily.        . pantoprazole (PROTONIX) 40 MG tablet Take 1 tablet (40 mg total) by mouth daily.  90 tablet  3  . venlafaxine XR (EFFEXOR-XR) 37.5 MG 24 hr capsule Take 1 capsule (37.5 mg total) by mouth daily with breakfast.  90 capsule  3   No current facility-administered medications on file prior to visit.    Review of Systems Patient denies any headache, lightheadedness or dizziness.  No sinus or allergy symptoms.   Acid reflux as outlined.   No chest pain, tightness or palpitations.  No increased shortness of breath.  No abdominal pain or cramping.  No bowel change, such as diarrhea, constipation, BRBPR or melana.  No urine change.  On effexor.  Doing well on  effexor.  Tolerating well.  Blood pressure doing well on her outside checks.   Hot flashes better.  Mood better.  Sleeping better. Is watching her diet and exercising.       Objective:   Physical Exam  Filed Vitals:   12/29/13 1432  BP: 120/82  Pulse: 60  Temp: 98.9 F (37.2 C)   Blood pressure recheck:  128/84, pulse 57  48 year old female in no acute distress.   HEENT:  Nares- clear.  Oropharynx - without lesions. NECK:  Supple.  Nontender.  No audible bruit.  HEART:  Appears to be regular. LUNGS:  No crackles or wheezing audible.  Respirations even and unlabored.  RADIAL PULSE:  Equal bilaterally.  ABDOMEN:  Soft, nontender.  Bowel sounds present and normal.  No audible abdominal bruit.    EXTREMITIES:  No increased edema present.  DP pulses palpable and equal bilaterally.           Assessment & Plan:  INCREASED PSYCHOSOCIAL STRESSORS.  Doing well on Effexor.  Follow.  Sleeping better.  Mood better.  HEALTH MAINTENANCE.  Mammogram 01/01/13 - BiRADS I.    Physical 04/24/13.  She is s/p hysterectomy.  Colonoscopy 08/12/10 - normal.    Problem List Items Addressed This Visit  Fatigue - Primary   GERD (gastroesophageal reflux disease)     On protonix and zantac.  Still with persistent symptoms and problems.  Is s/p lap band.  Refer back to GI for evaluation and question of need for EGD.       Relevant Orders      Ambulatory referral to Gastroenterology   Hypercholesterolemia     Low cholesterol diet and exercise.  Follow lipid profile.       Hypertension     Blood pressure as outlined.  Continue same medication regimen.  Follow metabolic panel.      Obesity, Class III, BMI 40-49.9 (morbid obesity)     Discussed diet, exercise and weight loss.  She is going to the gym.  Adjusting her diet.  Follow.      Sleep apnea     Had been doing well with her weight loss.  Follow.  Back at the gym.       Other Visit Diagnoses   Non-intractable vomiting without nausea, vomiting  of unspecified type        Relevant Orders       Ambulatory referral to Gastroenterology

## 2014-01-04 NOTE — Assessment & Plan Note (Signed)
On protonix and zantac.  Still with persistent symptoms and problems.  Is s/p lap band.  Refer back to GI for evaluation and question of need for EGD.

## 2014-01-04 NOTE — Assessment & Plan Note (Signed)
Low cholesterol diet and exercise.  Follow lipid profile.

## 2014-01-04 NOTE — Assessment & Plan Note (Signed)
Had been doing well with her weight loss.  Follow.  Back at the gym.

## 2014-01-04 NOTE — Assessment & Plan Note (Signed)
Discussed diet, exercise and weight loss.  She is going to the gym.  Adjusting her diet.  Follow.

## 2014-01-12 ENCOUNTER — Encounter: Payer: Self-pay | Admitting: Internal Medicine

## 2014-01-27 ENCOUNTER — Encounter: Payer: Self-pay | Admitting: *Deleted

## 2014-01-27 ENCOUNTER — Ambulatory Visit: Payer: Self-pay | Admitting: Internal Medicine

## 2014-01-27 LAB — HM MAMMOGRAPHY: HM MAMMO: NEGATIVE

## 2014-03-01 LAB — CBC AND DIFFERENTIAL
HCT: 43 % (ref 36–46)
Hemoglobin: 14.4 g/dL (ref 12.0–16.0)
NEUTROS ABS: 7 /uL
Platelets: 307 10*3/uL (ref 150–399)
WBC: 9.5 10^3/mL

## 2014-03-02 ENCOUNTER — Encounter: Payer: Self-pay | Admitting: Internal Medicine

## 2014-03-11 ENCOUNTER — Encounter: Payer: Self-pay | Admitting: Internal Medicine

## 2014-03-11 ENCOUNTER — Ambulatory Visit (INDEPENDENT_AMBULATORY_CARE_PROVIDER_SITE_OTHER): Payer: 59 | Admitting: Internal Medicine

## 2014-03-11 DIAGNOSIS — E7800 Pure hypercholesterolemia, unspecified: Secondary | ICD-10-CM

## 2014-03-11 DIAGNOSIS — I10 Essential (primary) hypertension: Secondary | ICD-10-CM

## 2014-03-11 DIAGNOSIS — K219 Gastro-esophageal reflux disease without esophagitis: Secondary | ICD-10-CM

## 2014-03-11 DIAGNOSIS — N951 Menopausal and female climacteric states: Secondary | ICD-10-CM

## 2014-03-11 MED ORDER — PANTOPRAZOLE SODIUM 40 MG PO TBEC: 40.0000 mg | DELAYED_RELEASE_TABLET | Freq: Two times a day (BID) | ORAL | Status: DC

## 2014-03-11 NOTE — Progress Notes (Signed)
Pre visit review using our clinic review tool, if applicable. No additional management support is needed unless otherwise documented below in the visit note. 

## 2014-03-16 ENCOUNTER — Encounter: Payer: Self-pay | Admitting: Internal Medicine

## 2014-03-16 NOTE — Progress Notes (Signed)
Subjective:    Patient ID: Kristine Hendricks, female    DOB: 1965/08/05, 48 y.o.   MRN: [id number]  HPI 48 year old female with past history of hypertension, sleep apnea and hypercholesterolemia who is s/p lap band surgery with significant weight loss.  She comes in today for a scheduled follow up.  Continuing on Protonix and Zantac.  Still with acid reflux despite medication.  Persistent and worsening problems.  Decreased appetite.  Vomiting when eats.  Intermittent, but frequent.   No cardiac symptoms with increased activity or exertion.  Breathing stable.  Taking effexor.  Feels better.  Hot flashes better.  Mood better.  She reports blood pressure is doing well.  Unable to watch her diet.  Just trying to find something she can eat.  She does report persistent rash.  On her chest and face.  Saw Dr Cheree Ditto ([place]).  Biopsy - subcutaneous lupus.         Past Medical History  Diagnosis Date  . Hypertension   . Hypercholesterolemia   . Seasonal allergies   . Sleep apnea   . History of chicken pox   . Asthma   . GERD (gastroesophageal reflux disease)   . Anemia     Current Outpatient Prescriptions on File Prior to Visit  Medication Sig Dispense Refill  . CALCIUM PO Take 300 mg by mouth 4 (four) times daily.     Marland Kitchen losartan (COZAAR) 100 MG tablet Take 1 tablet (100 mg total) by mouth daily. 90 tablet 3  . Multiple Vitamin (MULTIVITAMIN PO) Take by mouth daily.      Marland Kitchen venlafaxine XR (EFFEXOR-XR) 37.5 MG 24 hr capsule Take 1 capsule (37.5 mg total) by mouth daily with breakfast. 90 capsule 3   No current facility-administered medications on file prior to visit.    Review of Systems Patient denies any headache, lightheadedness or dizziness.  No sinus or allergy symptoms.   Acid reflux as outlined.   Difficulty swallowing.  Food coming back up.  Vomiting food. Decreased appetite.   No chest pain, tightness or palpitations.  No increased shortness of breath.  No abdominal pain or cramping.  No  bowel change, such as diarrhea, constipation, BRBPR or melana.  No urine change.  On effexor.  Doing well on effexor.  Tolerating well.  Blood pressure doing well on her outside checks.   Hot flashes better.  Mood better.        Objective:   Physical Exam  Filed Vitals:   03/11/14 0858  BP: 124/90  Pulse: 60  Temp: 98.6 F (37 C)   Blood pressure recheck:  69/46-67  48 year old female in no acute distress.   HEENT:  Nares- clear.  Oropharynx - without lesions. NECK:  Supple.  Nontender.  No audible bruit.  HEART:  Appears to be regular. LUNGS:  No crackles or wheezing audible.  Respirations even and unlabored.  RADIAL PULSE:  Equal bilaterally.  ABDOMEN:  Soft, nontender.  Bowel sounds present and normal.  No audible abdominal bruit.    EXTREMITIES:  No increased edema present.  DP pulses palpable and equal bilaterally.           Assessment & Plan:  1. Severe obesity (BMI >= 40) Diet and exercise.    2. Essential hypertension Blood pressure on her checks under good control.  Borderline here today.  Same medication regimen.  Follow met b.  Get her back soon to reassess blood pressure.    3. Gastroesophageal  reflux disease, esophagitis presence not specified Worsening reflux.  Vomiting food.  Problems swallowing.  Decreased appetite.  Needs EGD.  Has an appt with GI at the end of January.  Have called GI to get an earlier appt.  Change protonix to $RemoveBef'40mg'[id number]$  bid and zantac in the evening.    4. Hypercholesterolemia Low cholesterol diet and exercise.  LDL 141 on last check.  Follow.    5. Menopausal syndrome Doing well on effexor.    6.  SUBCUTANEOUS LUPUS.  Found on biopsy at dermatology.  Continue f/u with Dr Phillip Heal.    7. INCREASED PSYCHOSOCIAL STRESSORS.  Doing well on Effexor.  Follow.  Sleeping better.  Mood better.  HEALTH MAINTENANCE.  Mammogram 01/27/14 - BiRADS I.    Physical 04/24/13.  She is s/p hysterectomy.  Colonoscopy 08/12/10 - normal.    I spent 25 minutes with  the patient and more than 50% of the time was spent in consultation regarding the above.

## 2014-04-08 ENCOUNTER — Encounter: Payer: Self-pay | Admitting: Internal Medicine

## 2014-04-15 ENCOUNTER — Encounter: Payer: Self-pay | Admitting: Internal Medicine

## 2014-04-23 ENCOUNTER — Ambulatory Visit: Payer: Self-pay | Admitting: Unknown Physician Specialty

## 2014-05-15 LAB — BASIC METABOLIC PANEL
BUN: 16 mg/dL (ref 4–21)
Creatinine: 0.8 mg/dL (ref <=1.1)
GLUCOSE: 80 mg/dL
Potassium: 5.1 mmol/L (ref 3.4–5.3)
SODIUM: 143 mmol/L (ref 137–147)

## 2014-05-15 LAB — LIPID PANEL
CHOLESTEROL: 219 mg/dL — AB (ref 0–200)
HDL: 47 mg/dL (ref 35–70)
LDL CALC: 149 mg/dL
Triglycerides: 117 mg/dL (ref 40–160)

## 2014-05-15 LAB — HEPATIC FUNCTION PANEL
ALT: 13 U/L (ref 7–35)
AST: 18 U/L (ref 13–35)
Alkaline Phosphatase: 82 U/L (ref 25–125)
BILIRUBIN DIRECT: 0.08 mg/dL
Bilirubin, Total: 0.3 mg/dL

## 2014-05-18 ENCOUNTER — Encounter: Payer: Self-pay | Admitting: Internal Medicine

## 2014-05-21 ENCOUNTER — Ambulatory Visit (INDEPENDENT_AMBULATORY_CARE_PROVIDER_SITE_OTHER): Payer: 59 | Admitting: Internal Medicine

## 2014-05-21 ENCOUNTER — Encounter: Payer: Self-pay | Admitting: Internal Medicine

## 2014-05-21 VITALS — BP 140/84 | HR 60 | Temp 98.0°F | Ht 64.5 in

## 2014-05-21 DIAGNOSIS — E7800 Pure hypercholesterolemia, unspecified: Secondary | ICD-10-CM

## 2014-05-21 DIAGNOSIS — L931 Subacute cutaneous lupus erythematosus: Secondary | ICD-10-CM

## 2014-05-21 DIAGNOSIS — K2100 Gastro-esophageal reflux disease with esophagitis, without bleeding: Secondary | ICD-10-CM

## 2014-05-21 DIAGNOSIS — I10 Essential (primary) hypertension: Secondary | ICD-10-CM

## 2014-05-21 DIAGNOSIS — G4730 Sleep apnea, unspecified: Secondary | ICD-10-CM

## 2014-05-21 DIAGNOSIS — N951 Menopausal and female climacteric states: Secondary | ICD-10-CM

## 2014-05-21 MED ORDER — VENLAFAXINE HCL ER 37.5 MG PO CP24: 37.5000 mg | ORAL_CAPSULE | Freq: Every day | ORAL | Status: DC

## 2014-05-21 MED ORDER — LOSARTAN POTASSIUM 100 MG PO TABS: 100.0000 mg | ORAL_TABLET | Freq: Every day | ORAL | Status: DC

## 2014-05-21 NOTE — Progress Notes (Signed)
Patient ID: Kristine Hendricks, female   DOB: June 28, 1965, 49 y.o.   MRN: [id number]   Subjective:    Patient ID: Kristine Hendricks, female    DOB: Oct 05, 1965, 49 y.o.   MRN: [id number]  HPI  Patient here for a scheduled follow up.  She is doing much better.  Swallowing better.  On protonix bid now.  Just had EGD.  Esophagitis.  No vomiting.  Eating better.  Still exercising.  No cardiac symptoms with increased activity or exertion.  Breathing stable.  Bowels stable.  Blood pressure has been doing well.     Past Medical History  Diagnosis Date  . Hypertension   . Hypercholesterolemia   . Seasonal allergies   . Sleep apnea   . History of chicken pox   . Asthma   . GERD (gastroesophageal reflux disease)   . Anemia     Current Outpatient Prescriptions on File Prior to Visit  Medication Sig Dispense Refill  . CALCIUM PO Take 300 mg by mouth 4 (four) times daily.     . fluocinonide ointment (LIDEX) 0.05 %     . Multiple Vitamin (MULTIVITAMIN PO) Take by mouth daily.      . pantoprazole (PROTONIX) 40 MG tablet Take 1 tablet (40 mg total) by mouth 2 (two) times daily before a meal. 180 tablet 1   No current facility-administered medications on file prior to visit.    Review of Systems  Constitutional: Negative for appetite change (eating much better.) and unexpected weight change.  HENT: Negative for congestion and sinus pressure.   Respiratory: Negative for cough, chest tightness and shortness of breath.   Cardiovascular: Negative for chest pain, palpitations and leg swelling.  Gastrointestinal: Negative for nausea, vomiting, abdominal pain and diarrhea.  Musculoskeletal: Negative for back pain and joint swelling.  Skin: Negative for color change and rash.  Neurological: Negative for dizziness, light-headedness and headaches.       Objective:     Blood pressure recheck:  128/78  Physical Exam  Constitutional: She appears well-developed and well-nourished. No distress.  HENT:    Nose: Nose normal.  Mouth/Throat: Oropharynx is clear and moist.  Neck: Neck supple. No thyromegaly present.  Cardiovascular: Normal rate and regular rhythm.   Pulmonary/Chest: Breath sounds normal. No respiratory distress. She has no wheezes.  Abdominal: Soft. Bowel sounds are normal. There is no tenderness.  Musculoskeletal: She exhibits no edema or tenderness.  Lymphadenopathy:    She has no cervical adenopathy.  Skin:  Rash improved.      BP 140/84 mmHg  Pulse 60  Temp(Src) 98 F (36.7 C) (Oral)  Ht 5' 4.5" (1.638 m)  SpO2 99%  LMP 06/11/2003 Wt Readings from Last 3 Encounters:  03/11/14 289 lb 4 oz (131.203 kg)  12/29/13 287 lb (130.182 kg)  04/24/13 285 lb 4 oz (129.389 kg)     Lab Results  Component Value Date   WBC 9.5 03/01/2014   HGB 14.4 03/01/2014   HCT 43 03/01/2014   PLT 307 03/01/2014   GLUCOSE 77 06/04/2013   CHOL 219* 05/15/2014   TRIG 117 05/15/2014   HDL 47 05/15/2014   LDLCALC 149 05/15/2014   ALT 13 05/15/2014   AST 18 05/15/2014   NA 143 05/15/2014   K 5.1 05/15/2014   CL 102 06/04/2013   CREATININE 0.8 05/15/2014   BUN 16 05/15/2014   CO2 25 06/04/2013   TSH 3.81 12/26/2013   HGBA1C 5.1 11/16/2013  Assessment & Plan:   Problem List Items Addressed This Visit    GERD (gastroesophageal reflux disease)    Just had EGD.  Biopsy with active esophagitis. On protonix bid.  Symptoms have improved significantly.  Eating better.  Follow.        Relevant Medications   omeprazole (PRILOSEC) 20 MG capsule   Hypercholesterolemia    Low cholesterol diet and exercise.  LDL just checked - 149.  Discussed cholesterol medication.  She wants to continue to work on diet and exercise.        Relevant Medications   losartan (COZAAR) tablet   Hypertension - Primary    Blood pressure as outlined.  Same medication regimen.  Follow pressures and metabolic panel.        Relevant Medications   losartan (COZAAR) tablet   Menopausal syndrome     Doing well on effexor.        Obesity, Class III, BMI 40-49.9 (morbid obesity)    Discussed diet and exercise.        Sleep apnea    Had been doing well with weight loss.  Follow.        Subacute cutaneous lupus erythematosus    Diagnosed recently by dermatology.  She had questions regarding further w/up.  Will refer to Dr Jefm Bryant for further evaluation.            Einar Pheasant, MD

## 2014-05-21 NOTE — Progress Notes (Signed)
Pre visit review using our clinic review tool, if applicable. No additional management support is needed unless otherwise documented below in the visit note. 

## 2014-05-25 ENCOUNTER — Encounter: Payer: Self-pay | Admitting: Internal Medicine

## 2014-05-25 DIAGNOSIS — L931 Subacute cutaneous lupus erythematosus: Secondary | ICD-10-CM | POA: Insufficient documentation

## 2014-05-25 NOTE — Assessment & Plan Note (Signed)
Had been doing well with weight loss.  Follow.

## 2014-05-25 NOTE — Assessment & Plan Note (Signed)
Blood pressure as outlined.  Same medication regimen.  Follow pressures and metabolic panel.

## 2014-05-25 NOTE — Assessment & Plan Note (Signed)
Low cholesterol diet and exercise.  LDL just checked - 149.  Discussed cholesterol medication.  She wants to continue to work on diet and exercise.

## 2014-05-25 NOTE — Assessment & Plan Note (Signed)
Diagnosed recently by dermatology.  She had questions regarding further w/up.  Will refer to Dr Jefm Bryant for further evaluation.

## 2014-05-25 NOTE — Assessment & Plan Note (Signed)
Just had EGD.  Biopsy with active esophagitis. On protonix bid.  Symptoms have improved significantly.  Eating better.  Follow.

## 2014-05-25 NOTE — Assessment & Plan Note (Signed)
Doing well on effexor.   

## 2014-05-25 NOTE — Addendum Note (Signed)
Addended by: Alisa Graff on: 05/25/2014 01:20 AM   Modules accepted: Orders

## 2014-05-25 NOTE — Assessment & Plan Note (Signed)
Discussed diet and exercise 

## 2014-06-30 ENCOUNTER — Encounter: Payer: Self-pay | Admitting: Internal Medicine

## 2014-07-13 LAB — SURGICAL PATHOLOGY

## 2014-09-23 ENCOUNTER — Ambulatory Visit: Payer: 59 | Admitting: Internal Medicine

## 2014-10-25 ENCOUNTER — Other Ambulatory Visit: Payer: Self-pay | Admitting: Internal Medicine

## 2014-10-28 ENCOUNTER — Encounter: Payer: Self-pay | Admitting: Internal Medicine

## 2014-11-09 ENCOUNTER — Telehealth: Payer: Self-pay | Admitting: Internal Medicine

## 2014-11-09 ENCOUNTER — Other Ambulatory Visit: Payer: Self-pay

## 2014-11-09 ENCOUNTER — Emergency Department
Admission: EM | Admit: 2014-11-09 | Discharge: 2014-11-09 | Disposition: A | Discharge status: Home / Self Care | Payer: 59 | Attending: Emergency Medicine | Admitting: Emergency Medicine

## 2014-11-09 ENCOUNTER — Emergency Department: Payer: 59

## 2014-11-09 DIAGNOSIS — J690 Pneumonitis due to inhalation of food and vomit: Secondary | ICD-10-CM | POA: Insufficient documentation

## 2014-11-09 DIAGNOSIS — Z79899 Other long term (current) drug therapy: Secondary | ICD-10-CM | POA: Diagnosis not present

## 2014-11-09 DIAGNOSIS — R0602 Shortness of breath: Secondary | ICD-10-CM | POA: Diagnosis present

## 2014-11-09 DIAGNOSIS — I10 Essential (primary) hypertension: Secondary | ICD-10-CM | POA: Diagnosis not present

## 2014-11-09 DIAGNOSIS — J45901 Unspecified asthma with (acute) exacerbation: Secondary | ICD-10-CM | POA: Diagnosis not present

## 2014-11-09 MED ORDER — CLINDAMYCIN HCL 300 MG PO CAPS: 600.0000 mg | ORAL_CAPSULE | Freq: Two times a day (BID) | ORAL | Status: AC

## 2014-11-09 NOTE — Telephone Encounter (Signed)
Attempted to call the patient, left a voicemail, no notes in EPIC from the ER at this point, will follow up.

## 2014-11-09 NOTE — Discharge Instructions (Signed)
Aspiration Pneumonia °Aspiration pneumonia is an infection in your lungs. It occurs when food, liquid, or stomach contents (vomit) are inhaled (aspirated) into your lungs. When these things get into your lungs, swelling (inflammation) and infection can occur. This can make it difficult for you to breathe. Aspiration pneumonia is a serious condition and can be life threatening. °RISK FACTORS °Aspiration pneumonia is more likely to occur when a person's cough (gag) reflex or ability to swallow has been decreased. Some things that can do this include:  °· Having a brain injury or disease, such as stroke, seizures, Parkinson's disease, dementia, or amyotrophic lateral sclerosis (ALS).   °· Being given general anesthetic for procedures.   °· Being in a coma (unconscious).   °· Having a narrowing of the tube that carries food to the stomach (esophagus).   °· Drinking too much alcohol. If a person passes out and vomits, vomit can be swallowed into the lungs.   °· Taking certain medicines, such as tranquilizers or sedatives.   °SIGNS AND SYMPTOMS  °· Coughing after swallowing food or liquids.   °· Breathing problems, such as wheezing or shortness of breath.   °· Bluish skin. This can be caused by lack of oxygen.   °· Coughing up food or mucus. The mucus might contain blood, greenish material, or yellowish-white fluid (pus).   °· Fever.   °· Chest pain.   °· Being more tired than usual (fatigue).   °· Sweating more than usual.   °· Bad breath.   °DIAGNOSIS  °A physical exam will be done. During the exam, the health care provider will listen to your lungs with a stethoscope to check for:  °· Crackling sounds in the lungs. °· Decreased breath sounds. °· A rapid heartbeat. °Various tests may be ordered. These may include:  °· Chest X-ray.   °· CT scan.   °· Swallowing study. This test looks at how food is swallowed and whether it goes into your breathing tube (trachea) or food pipe (esophagus).   °· Sputum culture. Saliva and  mucus (sputum) are collected from the lungs or the tubes that carry air to the lungs (bronchi). The sputum is then tested for bacteria.   °· Bronchoscopy. This test uses a flexible tube (bronchoscope) to see inside the lungs. °TREATMENT  °Treatment will usually include antibiotic medicines. Other medicines may also be used to reduce fever or pain. You may need to be treated in the hospital. In the hospital, your breathing will be carefully monitored. Depending on how well you are breathing, you may need to be given oxygen, or you may need breathing support from a breathing machine (ventilator). For people who fail a swallowing study, a feeding tube might be placed in the stomach, or they may be asked to avoid certain food textures or liquids when they eat. °HOME CARE INSTRUCTIONS  °· Carefully follow any special eating instructions you were given, such as avoiding certain food textures or thickening liquids. This reduces the risk of developing aspiration pneumonia again.  °· Only take over-the-counter or prescription medicines as directed by your health care provider. Follow the directions carefully.   °· If you were prescribed antibiotics, take them as directed. Finish them even if you start to feel better.   °· Rest as instructed by your health care provider.   °· Keep all follow-up appointments with your health care provider.   °SEEK MEDICAL CARE IF:  °· You develop worsening shortness of breath, wheezing, or difficulty breathing.   °· You develop a fever.   °· You have chest pain.   °MAKE SURE YOU:  °· Understand these instructions. °· Will watch your   condition. °· Will get help right away if you are not doing well or get worse. °Document Released: 01/01/2009 Document Revised: 03/11/2013 Document Reviewed: 08/22/2012 °[employer]® Patient Information ©2015 [employer]. This information is not intended to replace advice given to you by your health care provider. Make sure you discuss any questions you have with  your health care provider. ° °

## 2014-11-09 NOTE — Telephone Encounter (Signed)
Pt was seen in ER.  Given abx.  Please calla and confirm pt doing ok.  Thanks

## 2014-11-09 NOTE — ED Provider Notes (Signed)
[facility] Emergency Department Provider Note  ____________________________________________  Time seen: 12:15 PM  I have reviewed the triage vital signs and the nursing notes.   HISTORY  Chief Complaint Shortness of Breath    HPI Kristine Hendricks is a 49 y.o. female who presents with complaints of shortness of breath. She reports on Friday she took her vitamins later than usual which caused acid reflux (she has a history of thoracic surgery) and she choked/aspirated at the time. She developed chills approximately 1-2 hours later and since that time she has felt mildly short of breath. She denies chest pain. No fevers at home. No nausea vomiting. No abdominal pain. No Recent travel, no calf pain     Past Medical History  Diagnosis Date  . Hypertension   . Hypercholesterolemia   . Seasonal allergies   . Sleep apnea   . History of chicken pox   . Asthma   . GERD (gastroesophageal reflux disease)   . Anemia     Patient Active Problem List   Diagnosis Date Noted  . Subacute cutaneous lupus erythematosus 05/25/2014  . Severe obesity (BMI >= 40) 03/16/2014  . Fatigue 04/29/2013  . Headache(784.0) 11/06/2012  . Menopausal syndrome 06/09/2012  . Obesity, Class III, BMI 40-49.9 (morbid obesity) 02/19/2012  . Sleep apnea 02/17/2012  . GERD (gastroesophageal reflux disease) 02/17/2012  . Hypertension 02/12/2012  . Hypercholesterolemia 02/12/2012    Past Surgical History  Procedure Laterality Date  . Laparoscopic gastric banding  01/29/09  . Cholecystectomy  2007  . Arthroscopic surgery      Dr Sabra Heck  . Myomectomy  9/04  . Dilation and curettage of uterus  8/04  . Breast surgery  4 /05    biopsy  . Vaginal hysterectomy  06/11/03    Current Outpatient Rx  Name  Route  Sig  Dispense  Refill  . CALCIUM PO   Oral   Take 300 mg by mouth 4 (four) times daily.          . clindamycin (CLEOCIN) 300 MG capsule   Oral   Take 2 capsules (600 mg  total) by mouth 2 (two) times daily.   28 capsule   0   . fluocinonide ointment (LIDEX) 0.05 %               . losartan (COZAAR) 100 MG tablet   Oral   Take 1 tablet (100 mg total) by mouth daily.   90 tablet   3   . Multiple Vitamin (MULTIVITAMIN PO)   Oral   Take by mouth daily.           Marland Kitchen omeprazole (PRILOSEC) 20 MG capsule   Oral   Take 20 mg by mouth daily.         . pantoprazole (PROTONIX) 40 MG tablet      Take 1 tablet by mouth  twice daily before a meal   180 tablet   0   . venlafaxine XR (EFFEXOR-XR) 37.5 MG 24 hr capsule   Oral   Take 1 capsule (37.5 mg total) by mouth daily with breakfast.   90 capsule   3     Allergies Sulfa antibiotics and Codeine  Family History  Problem Relation Age of Onset  . Lung cancer Mother   . Arthritis Mother   . Stroke Mother   . Hypertension Mother   . Colon polyps Mother   . Thyroid disease Sister     graves disease  .  Colon cancer      maternal grandparent  . Breast cancer Neg Hx   . Hyperlipidemia Father   . Stroke Maternal Aunt   . Stroke Maternal Uncle   . Cancer Maternal Grandmother     colon  . Alcohol abuse Cousin     Social History Social History  Substance Use Topics  . Smoking status: Never Smoker   . Smokeless tobacco: Never Used  . Alcohol Use: 0.0 oz/week    0 Standard drinks or equivalent per week     Comment: occasional    Review of Systems  Constitutional: Negative for fever. Eyes: Negative for visual changes. ENT: Positive for sore throat Cardiovascular: Negative for chest pain. Respiratory: Positive for shortness of breath. Gastrointestinal: Negative for abdominal pain, vomiting and diarrhea. Genitourinary: Negative for dysuria. Musculoskeletal: Negative for back pain. Skin: Negative for rash. Neurological: Negative for headaches or focal weakness Psychiatric: No anxiety    ____________________________________________   PHYSICAL EXAM:  VITAL SIGNS: ED Triage  Vitals  Enc Vitals Group     BP 11/09/14 1153 116/78 mmHg     Pulse Rate 11/09/14 1153 58     Resp 11/09/14 1153 18     Temp 11/09/14 1153 98.6 F (37 C)     Temp Source 11/09/14 1153 Oral     SpO2 11/09/14 1153 95 %     Weight 11/09/14 1153 269 lb (122.018 kg)     Height 11/09/14 1153 5\' 4"  (1.626 m)     Head Cir --      [place] --      Pain Score --      Pain Loc --      Pain Edu? --      Excl. in [place]? --     Constitutional: Alert and oriented. Well appearing and in no distress. Eyes: Conjunctivae are normal.  ENT   Head: Normocephalic and atraumatic.   Mouth/Throat: Mucous membranes are moist. Pharynx is normal Cardiovascular: Normal rate, regular rhythm. Normal and symmetric distal pulses are present in all extremities. No murmurs, rubs, or gallops. Respiratory: Normal respiratory effort without tachypnea nor retractions. Breath sounds are clear and equal bilaterally.  Gastrointestinal: Soft and non-tender in all quadrants. No distention. There is no CVA tenderness. Genitourinary: deferred Musculoskeletal: Nontender with normal range of motion in all extremities. No lower extremity tenderness nor edema. Neurologic:  Normal speech and language. No gross focal neurologic deficits are appreciated. Skin:  Skin is warm, dry and intact. No rash noted. Psychiatric: Mood and affect are normal. Patient exhibits appropriate insight and judgment.  ____________________________________________    LABS (pertinent positives/negatives)  Labs Reviewed - No data to display  ____________________________________________   EKG  ED ECG REPORT I, Lavonia Drafts, the attending physician, personally viewed and interpreted this ECG.  Date: 11/09/2014 EKG Time: 11:56 AM Rate: 59 Rhythm: Sinus bradycardia QRS Axis: normal Intervals: Short PR ST/T Wave abnormalities: normal Conduction Disutrbances: none Narrative Interpretation:  unremarkable   ____________________________________________    RADIOLOGY I have personally reviewed any xrays that were ordered on this patient: Chest x-ray shows prominence in the right lower and midlung  ____________________________________________   PROCEDURES  Procedure(s) performed: none  Critical Care performed: none  ____________________________________________   INITIAL IMPRESSION / ASSESSMENT AND PLAN / ED COURSE  Pertinent labs & imaging results that were available during my care of the patient were reviewed by me and considered in my medical decision making (see chart for details).  History of present illness chest  x-ray suspicious for aspiration pneumonia. Patient's heart rate and oxygen levels are stable. She is afebrile. I will place the patient on clindamycin. I discussed return precautions with her  ____________________________________________   FINAL CLINICAL IMPRESSION(S) / ED DIAGNOSES  Final diagnoses:  Aspiration pneumonia, unspecified aspiration pneumonia type     Lavonia Drafts, MD 11/09/14 1313

## 2014-11-09 NOTE — ED Notes (Signed)
Pt from home with sob; she aspirated vitamin capsule on Friday. She has taken puffs from inhaler over the weekend, but still SOB. NAD noted.

## 2014-11-09 NOTE — Telephone Encounter (Signed)
Patient Name: ULA COUVILLON DOB: July 15, 1965 Initial Comment Caller states asking to be seen today, took vitamins on Friday, feels like she aspirated and it went down in her lungs, not breathing normal and coughing, gets winded going up stairs Nurse Assessment Nurse: Vallery Sa, RN, [facility] [place] Date/Time ([place] Time): 11/09/2014 10:45:28 AM Confirm and document reason for call. If symptomatic, describe symptoms. ---Caller states she developed breathing difficulty 3 days ago that is still present today. She thinks it may be related to a choking episode with her vitamins 3 days ago. Has the patient traveled out of the country within the last 30 days? ---No Does the patient require triage? ---Yes Related visit to physician within the last 2 weeks? ---No Does the PT have any chronic conditions? (i.e. diabetes, asthma, etc.) ---Yes List chronic conditions. ---High Blood Pressure and Cholesterol Did the patient indicate they were pregnant? ---No Guidelines Guideline Title Affirmed Question Affirmed Notes Breathing Difficulty [1] MODERATE difficulty breathing (e.g., speaks in phrases, SOB even at rest, pulse 100-120) AND [2] NEW-onset or WORSE than normal Final Disposition User Go to ED Now Vallery Sa, RN, [facility] - ED Disagree/Comply: Comply

## 2014-11-09 NOTE — ED Notes (Signed)
Pt c/o feeling SOB since aspirating vitamin capsule powder on Friday.the patient is in NAD are present.Marland Kitchenrespirations WNL, skin is warm and dry.the patient is smiling and laughing with staff.Marland Kitchen

## 2014-11-09 NOTE — ED Notes (Signed)
Pt discharged home after verbalizing understanding of discharge instructions; nad noted. 

## 2014-11-10 NOTE — Telephone Encounter (Signed)
Ok.  Hold until receive call back.  Thanks

## 2014-11-10 NOTE — Telephone Encounter (Signed)
Left a message to return my call.

## 2014-11-10 NOTE — Telephone Encounter (Signed)
Pt not coughing as much. No work note was given but it was recommended that she rest for two weeks. Was told that she needs a f/u appt with her pcp. Pt has appt on 12/07/14. Please advise if she needs to be seen sooner.

## 2014-11-11 NOTE — Telephone Encounter (Signed)
She does need to be evaluated earlier than the 12/07/14 appt.  Please schedule her for the 11/12/14 appt being held or the 11/13/14 appt being held.  Keep the 12/07/14 appt scheduled also.  Let me know if any questions or problems.

## 2014-11-12 ENCOUNTER — Ambulatory Visit (INDEPENDENT_AMBULATORY_CARE_PROVIDER_SITE_OTHER): Payer: 59 | Admitting: Internal Medicine

## 2014-11-12 ENCOUNTER — Encounter: Payer: Self-pay | Admitting: Internal Medicine

## 2014-11-12 VITALS — BP 104/70 | HR 60 | Temp 98.1°F | Ht 64.5 in | Wt 271.2 lb

## 2014-11-12 DIAGNOSIS — K2100 Gastro-esophageal reflux disease with esophagitis, without bleeding: Secondary | ICD-10-CM

## 2014-11-12 DIAGNOSIS — I10 Essential (primary) hypertension: Secondary | ICD-10-CM

## 2014-11-12 DIAGNOSIS — J690 Pneumonitis due to inhalation of food and vomit: Secondary | ICD-10-CM | POA: Diagnosis not present

## 2014-11-12 DIAGNOSIS — Z1239 Encounter for other screening for malignant neoplasm of breast: Secondary | ICD-10-CM

## 2014-11-12 NOTE — Progress Notes (Signed)
Patient ID: Kristine Hendricks, female   DOB: 10/05/1965, 49 y.o.   MRN: [id number]   Subjective:    Patient ID: Kristine Hendricks, female    DOB: Nov 18, 1965, 49 y.o.   MRN: [id number]  HPI  Patient here for an ER follow up.  Was evaluated 11/09/14 in ER.  Diagnosed with aspiration pneumonia.  States the night symptoms began, she took a vitamin.  Had some emesis.  Felt cold.  The following day - felt horrible.  Started wheezing.  Had fever.  Last fever - yesterday - 99.8.  Did notice that two week prior, had gone to beach.  Noticed some increased windedness and some cough.  No vomiting.  Some decreased appetite.  Decreased energy and increased fatigue.  Cough is non productive.     Past Medical History  Diagnosis Date  . Hypertension   . Hypercholesterolemia   . Seasonal allergies   . Sleep apnea   . History of chicken pox   . Asthma   . GERD (gastroesophageal reflux disease)   . Anemia    Past Surgical History  Procedure Laterality Date  . Laparoscopic gastric banding  01/29/09  . Cholecystectomy  2007  . Arthroscopic surgery      Dr Sabra Heck  . Myomectomy  9/04  . Dilation and curettage of uterus  8/04  . Breast surgery  4 /05    biopsy  . Vaginal hysterectomy  06/11/03   Family History  Problem Relation Age of Onset  . Lung cancer Mother   . Arthritis Mother   . Stroke Mother   . Hypertension Mother   . Colon polyps Mother   . Thyroid disease Sister     graves disease  . Colon cancer      maternal grandparent  . Breast cancer Neg Hx   . Hyperlipidemia Father   . Stroke Maternal Aunt   . Stroke Maternal Uncle   . Cancer Maternal Grandmother     colon  . Alcohol abuse Cousin    Social History   Social History  . Marital Status: Single    Spouse Name: N/A  . Number of Children: 0  . 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 Smoking status: Never Smoker   . Smokeless tobacco: Never Used  . Alcohol Use: 0.0  oz/week    0 Standard drinks or equivalent per week     Comment: occasional  . Drug Use: No  . Sexual Activity: Not Asked   Other Topics Concern  . None   Social History Narrative    Outpatient Encounter Prescriptions as of 11/12/2014  Medication Sig  . CALCIUM PO Take 300 mg by mouth 4 (four) times daily.   . clindamycin (CLEOCIN) 300 MG capsule Take 2 capsules (600 mg total) by mouth 2 (two) times daily.  Marland Kitchen losartan (COZAAR) 100 MG tablet Take 1 tablet (100 mg total) by mouth daily.  . Multiple Vitamin (MULTIVITAMIN PO) Take by mouth daily.    Marland Kitchen omeprazole (PRILOSEC) 20 MG capsule Take 20 mg by mouth daily.  . pantoprazole (PROTONIX) 40 MG tablet Take 1 tablet by mouth  twice daily before a meal  . venlafaxine XR (EFFEXOR-XR) 37.5 MG 24 hr capsule Take 1 capsule (37.5 mg total) by mouth daily with breakfast.  . [DISCONTINUED] fluocinonide ointment (LIDEX) 0.05 %    No facility-administered encounter medications on file as of 11/12/2014.  Review of Systems  Constitutional: Positive for appetite change (decreased appetite.  ) and fatigue.  HENT: Negative for congestion (no nasal congestion.  ) and sinus pressure.   Eyes: Negative for discharge and redness.  Respiratory: Positive for cough, shortness of breath (has improved. ) and wheezing (has improved.). Negative for chest tightness.   Cardiovascular: Negative for chest pain, palpitations and leg swelling.  Gastrointestinal: Negative for nausea, vomiting, abdominal pain and diarrhea.  Genitourinary: Negative for dysuria and difficulty urinating.  Skin: Negative for color change and rash.  Neurological: Negative for dizziness, light-headedness and headaches.  Psychiatric/Behavioral: Negative for dysphoric mood and agitation.       Objective:    Physical Exam  Constitutional: She appears well-developed and well-nourished. No distress.  HENT:  Nose: Nose normal.  Mouth/Throat: Oropharynx is clear and moist.  Eyes:  Conjunctivae are normal. Right eye exhibits no discharge. Left eye exhibits no discharge.  Neck: Neck supple. No thyromegaly present.  Cardiovascular: Normal rate and regular rhythm.   Pulmonary/Chest: Breath sounds normal. No respiratory distress. She has no wheezes.  Abdominal: Soft. Bowel sounds are normal. There is no tenderness.  Musculoskeletal: She exhibits no edema or tenderness.  Lymphadenopathy:    She has no cervical adenopathy.  Skin: No rash noted. No erythema.  Psychiatric: She has a normal mood and affect. Her behavior is normal.    BP 104/70 mmHg  Pulse 60  Temp(Src) 98.1 F (36.7 C) (Oral)  Ht 5' 4.5" (1.638 m)  Wt 271 lb 4 oz (123.038 kg)  BMI 45.86 kg/m2  SpO2 96%  LMP 06/11/2003 Wt Readings from Last 3 Encounters:  11/12/14 271 lb 4 oz (123.038 kg)  11/09/14 269 lb (122.018 kg)  03/11/14 289 lb 4 oz (131.203 kg)     Lab Results  Component Value Date   WBC 9.5 03/01/2014   HGB 14.4 03/01/2014   HCT 43 03/01/2014   PLT 307 03/01/2014   GLUCOSE 77 06/04/2013   CHOL 219* 05/15/2014   TRIG 117 05/15/2014   HDL 47 05/15/2014   LDLCALC 149 05/15/2014   ALT 13 05/15/2014   AST 18 05/15/2014   NA 143 05/15/2014   K 5.1 05/15/2014   CL 102 06/04/2013   CREATININE 0.8 05/15/2014   BUN 16 05/15/2014   CO2 25 06/04/2013   TSH 3.81 12/26/2013   HGBA1C 5.1 11/16/2013    Dg Chest 2 View  11/09/2014   CLINICAL DATA:  Shortness of Breath. Aspirated vitamin capsule powder on Friday.  EXAM: CHEST  2 VIEW  COMPARISON:  None.  FINDINGS: There is interstitial prominence throughout the mid and lower right lung. No confluent opacity on the left. Heart is upper limits normal in size. No effusions. No acute bony abnormality.  IMPRESSION: Interstitial prominence and peribronchial thickening in the right mid and lower lung. Cannot exclude infection such as atypical infection or bronchopneumonia.   Electronically Signed   By: Rolm Baptise M.D.   On: 11/09/2014 12:21         Assessment & Plan:   Problem List Items Addressed This Visit    Aspiration pneumonia    Recent ER evaluation.  Diagnosed with aspiratin pneumonia.  Taking clindamycin.  Tolerating.  Start a probiotic.  Symptoms have improved.  Still with increased fatigue.  Breathing is better.  Continue abx as she is doing.  Remain out of work.  Schedule f/u with me next week.  Reassess return to work at that visit.  Will need f/u cxr.  GERD (gastroesophageal reflux disease)    Acid reflux controlled.  Follow.  On protonix.       Hypertension    Blood pressure has been doing better with weight loss.  Follow.         Other Visit Diagnoses    Screening breast examination    -  Primary    Relevant Orders    MM DIGITAL SCREENING BILATERAL      I spent 25 minutes with the patient and more than 50% of the time was spent in consultation regarding the above.     Einar Pheasant, MD

## 2014-11-12 NOTE — Progress Notes (Signed)
Pre-visit discussion using our clinic review tool. No additional management support is needed unless otherwise documented below in the visit note.  

## 2014-11-13 LAB — LIPID PANEL
Cholesterol: 216 mg/dL — AB (ref 0–200)
HDL: 39 mg/dL (ref 35–70)
LDL CALC: 145 mg/dL
Triglycerides: 158 mg/dL (ref 40–160)

## 2014-11-13 LAB — BASIC METABOLIC PANEL: Glucose: 87 mg/dL

## 2014-11-13 LAB — HEMOGLOBIN A1C: HEMOGLOBIN A1C: 5.4 % (ref 4.0–6.0)

## 2014-11-15 ENCOUNTER — Encounter: Payer: Self-pay | Admitting: Internal Medicine

## 2014-11-15 NOTE — Assessment & Plan Note (Signed)
Recent ER evaluation.  Diagnosed with aspiratin pneumonia.  Taking clindamycin.  Tolerating.  Start a probiotic.  Symptoms have improved.  Still with increased fatigue.  Breathing is better.  Continue abx as she is doing.  Remain out of work.  Schedule f/u with me next week.  Reassess return to work at that visit.  Will need f/u cxr.

## 2014-11-15 NOTE — Assessment & Plan Note (Signed)
Blood pressure has been doing better with weight loss.  Follow.

## 2014-11-15 NOTE — Assessment & Plan Note (Signed)
Acid reflux controlled.  Follow.  On protonix.

## 2014-11-19 ENCOUNTER — Ambulatory Visit (INDEPENDENT_AMBULATORY_CARE_PROVIDER_SITE_OTHER): Payer: 59 | Admitting: Internal Medicine

## 2014-11-19 ENCOUNTER — Encounter: Payer: Self-pay | Admitting: Internal Medicine

## 2014-11-19 VITALS — BP 111/77 | HR 85 | Temp 98.2°F | Ht 64.5 in | Wt 274.5 lb

## 2014-11-19 DIAGNOSIS — J690 Pneumonitis due to inhalation of food and vomit: Secondary | ICD-10-CM

## 2014-11-19 DIAGNOSIS — I10 Essential (primary) hypertension: Secondary | ICD-10-CM

## 2014-11-19 DIAGNOSIS — K2100 Gastro-esophageal reflux disease with esophagitis, without bleeding: Secondary | ICD-10-CM

## 2014-11-19 NOTE — Progress Notes (Signed)
Patient ID: Kristine Hendricks, female   DOB: [birth year], 49 y.o.   MRN: [id number]   Subjective:    Patient ID: Kristine Hendricks, female    DOB: [birth year], 49 y.o.   MRN: [id number]  HPI  Patient here for a scheduled follow up.  Was just evaluated in ER and diagnosed with pneumonia.  Treated with clindamycin.  Here for f/u.  Is better.  Has been out of work. 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  Has not been coughing.  Breathing has been better.  No sob.  No wheezing.  No acid reflux.  Blood pressure has been doing well.  Still with increased fatigue.     Past Medical History  Diagnosis Date  . Hypertension   . Hypercholesterolemia   . Seasonal allergies   . Sleep apnea   . History of chicken pox   . Asthma   . GERD (gastroesophageal reflux disease)   . Anemia    Past Surgical History  Procedure Laterality Date  . Laparoscopic gastric banding  01/29/09  . Cholecystectomy  2007  . Arthroscopic surgery      Dr Sabra Heck  . Myomectomy  9/04  . Dilation and curettage of uterus  8/04  . Breast surgery  4 /05    biopsy  . Vaginal hysterectomy  06/11/03   Family History  Problem Relation Age of Onset  . Lung cancer Mother   . Arthritis Mother   . Stroke Mother   . Hypertension Mother   . Colon polyps Mother   . Thyroid disease Sister     graves disease  . Colon cancer      maternal grandparent  . Breast cancer Neg Hx   . Hyperlipidemia Father   . Stroke Maternal Aunt   . Stroke Maternal Uncle   . Cancer Maternal Grandmother     colon  . Alcohol abuse Cousin    Social History   Social History  . Marital Status: Single    Spouse Name: N/A  . Number of Children: 0  . 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 Smoking status: Never Smoker   . Smokeless tobacco: Never Used  . Alcohol Use: 0.0 oz/week    0 Standard drinks or equivalent per week     Comment: occasional  . Drug  Use: No  . Sexual Activity: Not Asked   Other Topics Concern  . None   Social History Narrative    Outpatient Encounter Prescriptions as of 11/19/2014  Medication Sig  . CALCIUM PO Take 300 mg by mouth 4 (four) times daily.   Marland Kitchen losartan (COZAAR) 100 MG tablet Take 1 tablet (100 mg total) by mouth daily.  . Multiple Vitamin (MULTIVITAMIN PO) Take by mouth daily.    Marland Kitchen omeprazole (PRILOSEC) 20 MG capsule Take 20 mg by mouth daily.  . pantoprazole (PROTONIX) 40 MG tablet Take 1 tablet by mouth  twice daily before a meal  . venlafaxine XR (EFFEXOR-XR) 37.5 MG 24 hr capsule Take 1 capsule (37.5 mg total) by mouth daily with breakfast.   No facility-administered encounter medications on file as of 11/19/2014.    Review of Systems  Constitutional: Positive for fatigue. Negative for appetite change.  HENT: Negative for congestion and sinus pressure.   Respiratory: Positive for cough (today.  no previous coughing.  no chest congestion. ). Negative for chest  tightness, shortness of breath and wheezing.   Cardiovascular: Negative for chest pain, palpitations and leg swelling.  Gastrointestinal: Negative for nausea, vomiting and diarrhea.  Neurological: Negative for dizziness, light-headedness and headaches.       Objective:     Blood pressure rechecked by me:  110/78  Physical Exam  Constitutional: She appears well-developed and well-nourished. No distress.  HENT:  Nose: Nose normal.  Mouth/Throat: Oropharynx is clear and moist.  Eyes: Conjunctivae are normal. Right eye exhibits no discharge. Left eye exhibits no discharge.  Neck: Neck supple.  Cardiovascular: Normal rate and regular rhythm.   Pulmonary/Chest: Breath sounds normal. No respiratory distress. She has no wheezes.  Abdominal: Soft. Bowel sounds are normal. There is no tenderness.  Lymphadenopathy:    She has no cervical adenopathy.  Skin: No rash noted. No erythema.    BP 111/77 mmHg  Pulse 85  Temp(Src) 98.2 F (36.8  C) (Oral)  Ht 5' 4.5" (1.638 m)  Wt 274 lb 8 oz (124.512 kg)  BMI 46.41 kg/m2  SpO2 97%  LMP 06/11/2003 Wt Readings from Last 3 Encounters:  11/19/14 274 lb 8 oz (124.512 kg)  11/12/14 271 lb 4 oz (123.038 kg)  11/09/14 269 lb (122.018 kg)     Lab Results  Component Value Date   WBC 9.5 03/01/2014   HGB 14.4 03/01/2014   HCT 43 03/01/2014   PLT 307 03/01/2014   GLUCOSE 77 06/04/2013   CHOL 219* 05/15/2014   TRIG 117 05/15/2014   HDL 47 05/15/2014   LDLCALC 149 05/15/2014   ALT 13 05/15/2014   AST 18 05/15/2014   NA 143 05/15/2014   K 5.1 05/15/2014   CL 102 06/04/2013   CREATININE 0.8 05/15/2014   BUN 16 05/15/2014   CO2 25 06/04/2013   TSH 3.81 12/26/2013   HGBA1C 5.1 11/16/2013    Dg Chest 2 View  11/09/2014   CLINICAL DATA:  Shortness of Breath. Aspirated vitamin capsule powder on Friday.  EXAM: CHEST  2 VIEW  COMPARISON:  None.  FINDINGS: There is interstitial prominence throughout the mid and lower right lung. No confluent opacity on the left. Heart is upper limits normal in size. No effusions. No acute bony abnormality.  IMPRESSION: Interstitial prominence and peribronchial thickening in the right mid and lower lung. Cannot exclude infection such as atypical infection or bronchopneumonia.   Electronically Signed   By: Rolm Baptise M.D.   On: 11/09/2014 12:21       Assessment & Plan:   Problem List Items Addressed This Visit    Aspiration pneumonia - Primary    Clindamycin.  Doing better.  Breathing better.  Still with increased fatigue.  Will remain out of work until 11/24/14.  1/2 days for two days and then return to full days.  Follow.  Notify me if persistent symptoms or problems.        Relevant Orders   DG Chest 2 View   GERD (gastroesophageal reflux disease)    No acid reflux reported.  On protonix.       Hypertension    Blood pressure doing well.  Same medication regimen.  Follow pressures.            Einar Pheasant, MD

## 2014-11-19 NOTE — Progress Notes (Signed)
Pre visit review using our clinic review tool, if applicable. No additional management support is needed unless otherwise documented below in the visit note. 

## 2014-11-23 ENCOUNTER — Encounter: Payer: Self-pay | Admitting: Internal Medicine

## 2014-11-23 NOTE — Assessment & Plan Note (Signed)
No acid reflux reported.  On protonix.  

## 2014-11-23 NOTE — Assessment & Plan Note (Signed)
Blood pressure doing well.  Same medication regimen.  Follow pressures.   

## 2014-11-23 NOTE — Assessment & Plan Note (Signed)
Clindamycin.  Doing better.  Breathing better.  Still with increased fatigue.  Will remain out of work until 11/24/14.  1/2 days for two days and then return to full days.  Follow.  Notify me if persistent symptoms or problems.

## 2014-12-07 ENCOUNTER — Ambulatory Visit: Payer: 59 | Admitting: Internal Medicine

## 2014-12-08 ENCOUNTER — Ambulatory Visit
Admission: RE | Admit: 2014-12-08 | Discharge: 2014-12-08 | Disposition: A | Discharge status: Home / Self Care | Payer: 59 | Source: Ambulatory Visit | Attending: Internal Medicine | Admitting: Internal Medicine

## 2014-12-08 DIAGNOSIS — J690 Pneumonitis due to inhalation of food and vomit: Secondary | ICD-10-CM

## 2014-12-08 DIAGNOSIS — Z09 Encounter for follow-up examination after completed treatment for conditions other than malignant neoplasm: Secondary | ICD-10-CM | POA: Insufficient documentation

## 2014-12-08 DIAGNOSIS — M47814 Spondylosis without myelopathy or radiculopathy, thoracic region: Secondary | ICD-10-CM | POA: Insufficient documentation

## 2014-12-12 ENCOUNTER — Encounter: Payer: Self-pay | Admitting: Internal Medicine

## 2014-12-17 NOTE — Telephone Encounter (Signed)
Sent pt a my chart message regarding appt 12/21/14 at 4:00.

## 2014-12-21 ENCOUNTER — Ambulatory Visit (INDEPENDENT_AMBULATORY_CARE_PROVIDER_SITE_OTHER): Payer: 59 | Admitting: Internal Medicine

## 2014-12-21 ENCOUNTER — Encounter: Payer: Self-pay | Admitting: Internal Medicine

## 2014-12-21 VITALS — BP 110/70 | HR 73 | Temp 98.3°F | Resp 18 | Ht 64.5 in | Wt 280.2 lb

## 2014-12-21 DIAGNOSIS — Z23 Encounter for immunization: Secondary | ICD-10-CM | POA: Diagnosis not present

## 2014-12-21 DIAGNOSIS — K2100 Gastro-esophageal reflux disease with esophagitis, without bleeding: Secondary | ICD-10-CM

## 2014-12-21 DIAGNOSIS — E66813 Obesity, class 3: Secondary | ICD-10-CM

## 2014-12-21 DIAGNOSIS — J690 Pneumonitis due to inhalation of food and vomit: Secondary | ICD-10-CM

## 2014-12-21 DIAGNOSIS — E7800 Pure hypercholesterolemia, unspecified: Secondary | ICD-10-CM | POA: Diagnosis not present

## 2014-12-21 DIAGNOSIS — I10 Essential (primary) hypertension: Secondary | ICD-10-CM | POA: Diagnosis not present

## 2014-12-21 NOTE — Progress Notes (Signed)
Patient ID: Kristine Hendricks, female   DOB: [birth year], 49 y.o.   MRN: [id number]   Subjective:    Patient ID: Kristine Hendricks, female    DOB: 1965-03-22, 49 y.o.   MRN: [id number]  HPI  Patient with past history of hypertension and hypercholesterolemia who comes in today for follow up and to have work form completed.  She is doing well.  No increased cough or congestion.  Feels better.  Still some minimal cough in the am.  No chest congestion.  No wheezing.  No sob.  No acid reflux.  No swallowing problems.  No abdominal pain or cramping.  No chest pain or tightness.  Discussed diet and exercise.     Past Medical History  Diagnosis Date  . Hypertension   . Hypercholesterolemia   . Seasonal allergies   . Sleep apnea   . History of chicken pox   . Asthma   . GERD (gastroesophageal reflux disease)   . Anemia    Past Surgical History  Procedure Laterality Date  . Laparoscopic gastric banding  01/29/09  . Cholecystectomy  2007  . Arthroscopic surgery      Dr Sabra Heck  . Myomectomy  9/04  . Dilation and curettage of uterus  8/04  . Breast surgery  4 /05    biopsy  . Vaginal hysterectomy  06/11/03   Family History  Problem Relation Age of Onset  . Lung cancer Mother   . Arthritis Mother   . Stroke Mother   . Hypertension Mother   . Colon polyps Mother   . Thyroid disease Sister     graves disease  . Colon cancer      maternal grandparent  . Breast cancer Neg Hx   . Hyperlipidemia Father   . Stroke Maternal Aunt   . Stroke Maternal Uncle   . Cancer Maternal Grandmother     colon  . Alcohol abuse Cousin    Social History   Social History  . Marital Status: Single    Spouse Name: N/A  . Number of Children: 0  . 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 Smoking status: Never Smoker   . Smokeless tobacco: Never Used  . Alcohol Use: 0.0 oz/week    0 Standard drinks or equivalent per week     Comment: occasional    . Drug Use: No  . Sexual Activity: Not Asked   Other Topics Concern  . None   Social History Narrative    Outpatient Encounter Prescriptions as of 12/21/2014  Medication Sig  . CALCIUM PO Take 300 mg by mouth 4 (four) times daily.   Marland Kitchen losartan (COZAAR) 100 MG tablet Take 1 tablet (100 mg total) by mouth daily.  . Multiple Vitamin (MULTIVITAMIN PO) Take by mouth daily.    Marland Kitchen omeprazole (PRILOSEC) 20 MG capsule Take 20 mg by mouth daily.  . pantoprazole (PROTONIX) 40 MG tablet Take 1 tablet by mouth  twice daily before a meal  . venlafaxine XR (EFFEXOR-XR) 37.5 MG 24 hr capsule Take 1 capsule (37.5 mg total) by mouth daily with breakfast.   No facility-administered encounter medications on file as of 12/21/2014.    Review of Systems  Constitutional: Negative for fever and appetite change.  HENT: Negative for congestion and sinus pressure.   Respiratory: Negative for cough, chest tightness and shortness of breath.   Cardiovascular: Negative for chest pain, palpitations and leg  swelling.  Gastrointestinal: Negative for nausea, vomiting, abdominal pain and diarrhea.  Skin: Negative for color change and rash.  Neurological: Negative for dizziness, light-headedness and headaches.  Psychiatric/Behavioral: Negative for dysphoric mood and agitation.       Objective:    Physical Exam  Constitutional: She appears well-developed and well-nourished. No distress.  HENT:  Nose: Nose normal.  Mouth/Throat: Oropharynx is clear and moist.  Neck: Neck supple. No thyromegaly present.  Cardiovascular: Normal rate and regular rhythm.   Pulmonary/Chest: Breath sounds normal. No respiratory distress. She has no wheezes.  Abdominal: Soft. Bowel sounds are normal. There is no tenderness.  Musculoskeletal: She exhibits no edema or tenderness.  Lymphadenopathy:    She has no cervical adenopathy.  Skin: No rash noted. No erythema.  Psychiatric: She has a normal mood and affect. Her behavior is  normal.    BP 110/70 mmHg  Pulse 73  Temp(Src) 98.3 F (36.8 C) (Oral)  Resp 18  Ht 5' 4.5" (1.638 m)  Wt 280 lb 4 oz (127.121 kg)  BMI 47.38 kg/m2  SpO2 97%  LMP 06/11/2003 Wt Readings from Last 3 Encounters:  12/21/14 280 lb 4 oz (127.121 kg)  11/19/14 274 lb 8 oz (124.512 kg)  11/12/14 271 lb 4 oz (123.038 kg)     Lab Results  Component Value Date   WBC 9.5 03/01/2014   HGB 14.4 03/01/2014   HCT 43 03/01/2014   PLT 307 03/01/2014   GLUCOSE 77 06/04/2013   CHOL 216* 11/13/2014   TRIG 158 11/13/2014   HDL 39 11/13/2014   LDLCALC 145 11/13/2014   ALT 13 05/15/2014   AST 18 05/15/2014   NA 143 05/15/2014   K 5.1 05/15/2014   CL 102 06/04/2013   CREATININE 0.8 05/15/2014   BUN 16 05/15/2014   CO2 25 06/04/2013   TSH 3.81 12/26/2013   HGBA1C 5.4 11/13/2014    Dg Chest 2 View  12/09/2014   CLINICAL DATA:  Pneumonia.  EXAM: CHEST  2 VIEW  COMPARISON:  11/08/2014  FINDINGS: Prior right basilar airspace opacity has resolved.  Mild enlargement of the cardiopericardial silhouette, without edema.  Mild thoracic spondylosis.  No pleural effusion.  Tubing projects over the left upper quadrant.  IMPRESSION: 1. Prior pneumonia has resolved. 2. Mild enlargement of the cardiopericardial silhouette   Electronically Signed   By: Van Clines M.D.   On: 12/09/2014 09:46       Assessment & Plan:   Problem List Items Addressed This Visit    Aspiration pneumonia ([place])    Doing well.  Feels better.  cxr clear.  Follow.        GERD (gastroesophageal reflux disease)    No acid reflux.  On protonix.  Follow.       Hypercholesterolemia    Low cholesterol diet and exercise.  Follow lipid panel.        Hypertension    Blood pressure under good control.  Continue same medication regimen.  Follow pressures.  Follow metabolic panel.        Obesity, Class III, BMI 40-49.9 (morbid obesity) (HCC)    Discussed diet and exercise.  Follow.         Other Visit Diagnoses     Encounter for immunization    -  Primary        Einar Pheasant, MD

## 2014-12-21 NOTE — Progress Notes (Signed)
Pre-visit discussion using our clinic review tool. No additional management support is needed unless otherwise documented below in the visit note.  

## 2014-12-27 ENCOUNTER — Encounter: Payer: Self-pay | Admitting: Internal Medicine

## 2014-12-27 NOTE — Assessment & Plan Note (Signed)
No acid reflux.  On protonix.  Follow.

## 2014-12-27 NOTE — Assessment & Plan Note (Signed)
Low cholesterol diet and exercise.  Follow lipid panel.   

## 2014-12-27 NOTE — Assessment & Plan Note (Signed)
Blood pressure under good control.  Continue same medication regimen.  Follow pressures.  Follow metabolic panel.   

## 2014-12-27 NOTE — Assessment & Plan Note (Signed)
Doing well.  Feels better.  cxr clear.  Follow.

## 2014-12-27 NOTE — Assessment & Plan Note (Signed)
Discussed diet and exercise.  Follow.  

## 2015-01-22 ENCOUNTER — Encounter: Payer: 59 | Admitting: Internal Medicine

## 2015-03-19 ENCOUNTER — Other Ambulatory Visit: Payer: Self-pay | Admitting: Internal Medicine

## 2015-03-20 LAB — CBC WITH DIFFERENTIAL/PLATELET
BASOS: 1 %
Basophils Absolute: 0.1 10*3/uL (ref 0.0–0.2)
EOS (ABSOLUTE): 0.2 10*3/uL (ref 0.0–0.4)
Eos: 2 %
Hematocrit: 40 % (ref 34.0–46.6)
Hemoglobin: 13.7 g/dL (ref 11.1–15.9)
IMMATURE GRANS (ABS): 0 10*3/uL (ref 0.0–0.1)
Immature Granulocytes: 0 %
LYMPHS ABS: 1.7 10*3/uL (ref 0.7–3.1)
Lymphs: 25 %
MCH: 28.4 pg (ref 26.6–33.0)
MCHC: 34.3 g/dL (ref 31.5–35.7)
MCV: 83 fL (ref 79–97)
MONOS ABS: 0.6 10*3/uL (ref 0.1–0.9)
Monocytes: 8 %
NEUTROS ABS: 4.3 10*3/uL (ref 1.4–7.0)
Neutrophils: 64 %
PLATELETS: 292 10*3/uL (ref 150–379)
RBC: 4.83 x10E6/uL (ref 3.77–5.28)
RDW: 13 % (ref 12.3–15.4)
WBC: 6.8 10*3/uL (ref 3.4–10.8)

## 2015-03-20 LAB — LIPID PANEL W/O CHOL/HDL RATIO
Cholesterol, Total: 220 mg/dL — ABNORMAL HIGH (ref 100–199)
HDL: 57 mg/dL (ref >39)
LDL Calculated: 145 mg/dL — ABNORMAL HIGH (ref 0–99)
Triglycerides: 90 mg/dL (ref 0–149)
VLDL CHOLESTEROL CAL: 18 mg/dL (ref 5–40)

## 2015-03-20 LAB — TSH: TSH: 6.99 u[IU]/mL — ABNORMAL HIGH (ref 0.450–4.500)

## 2015-03-20 LAB — HEPATIC FUNCTION PANEL
ALBUMIN: 3.9 g/dL (ref 3.5–5.5)
ALT: 12 IU/L (ref 0–32)
AST: 18 IU/L (ref 0–40)
Alkaline Phosphatase: 80 IU/L (ref 39–117)
BILIRUBIN TOTAL: 0.3 mg/dL (ref 0.0–1.2)
Bilirubin, Direct: 0.08 mg/dL (ref 0.00–0.40)
Total Protein: 6.2 g/dL (ref 6.0–8.5)

## 2015-03-20 LAB — BASIC METABOLIC PANEL
BUN / CREAT RATIO: 22 (ref 9–23)
BUN: 17 mg/dL (ref 6–24)
CHLORIDE: 102 mmol/L (ref 96–106)
CO2: 24 mmol/L (ref 18–29)
Calcium: 9.5 mg/dL (ref 8.7–10.2)
Creatinine, Ser: 0.78 mg/dL (ref 0.57–1.00)
GFR calc non Af Amer: 89 mL/min/{1.73_m2} (ref >59)
GFR, EST AFRICAN AMERICAN: 103 mL/min/{1.73_m2} (ref >59)
Glucose: 85 mg/dL (ref 65–99)
Potassium: 4.7 mmol/L (ref 3.5–5.2)
SODIUM: 142 mmol/L (ref 134–144)

## 2015-03-20 LAB — HGB A1C W/O EAG: Hgb A1c MFr Bld: 5.3 % (ref 4.8–5.6)

## 2015-03-24 ENCOUNTER — Encounter: Payer: Self-pay | Admitting: Internal Medicine

## 2015-03-25 ENCOUNTER — Ambulatory Visit (INDEPENDENT_AMBULATORY_CARE_PROVIDER_SITE_OTHER): Payer: 59 | Admitting: Internal Medicine

## 2015-03-25 ENCOUNTER — Encounter: Payer: Self-pay | Admitting: Internal Medicine

## 2015-03-25 VITALS — BP 120/80 | HR 64 | Temp 98.2°F | Resp 18 | Ht 64.25 in | Wt 289.5 lb

## 2015-03-25 DIAGNOSIS — Z0000 Encounter for general adult medical examination without abnormal findings: Secondary | ICD-10-CM

## 2015-03-25 DIAGNOSIS — K2100 Gastro-esophageal reflux disease with esophagitis, without bleeding: Secondary | ICD-10-CM

## 2015-03-25 DIAGNOSIS — I10 Essential (primary) hypertension: Secondary | ICD-10-CM

## 2015-03-25 DIAGNOSIS — E7800 Pure hypercholesterolemia, unspecified: Secondary | ICD-10-CM

## 2015-03-25 DIAGNOSIS — N951 Menopausal and female climacteric states: Secondary | ICD-10-CM

## 2015-03-25 MED ORDER — VENLAFAXINE HCL ER 37.5 MG PO CP24: 37.5000 mg | ORAL_CAPSULE | Freq: Every day | ORAL | Status: DC

## 2015-03-25 MED ORDER — ROSUVASTATIN CALCIUM 5 MG PO TABS: 5.0000 mg | ORAL_TABLET | Freq: Every day | ORAL | Status: DC

## 2015-03-25 MED ORDER — LOSARTAN POTASSIUM 100 MG PO TABS: 100.0000 mg | ORAL_TABLET | Freq: Every day | ORAL | Status: DC

## 2015-03-25 NOTE — Progress Notes (Signed)
Patient ID: Kristine Hendricks, female   DOB: 1965/11/01, 50 y.o.   MRN: [id number]   Subjective:    Patient ID: Kristine Hendricks, female    DOB: 02/27/1966, 50 y.o.   MRN: [id number]  HPI  Patient with past history of hypercholesterolemia, sleep apnea, asthma, GERD and hypertension.  She comes in today to follow up on these issues as well as for a complete physical exam.  She is doing well.  No problems swallowing.  No acid reflux.  Has cut out the otc acid medication.  We discussed trying to decrease the second dose of the protonix.  No abdominal pain or cramping.  Bowels stable.  Job going well.  Discussed recent lab results.  Increased cholesterol.  Discussed treatment.  Discussed starting cholesterol medication.  Discussed diet and exercise.     Past Medical History  Diagnosis Date  . Hypertension   . Hypercholesterolemia   . Seasonal allergies   . Sleep apnea   . History of chicken pox   . Asthma   . GERD (gastroesophageal reflux disease)   . Anemia    Past Surgical History  Procedure Laterality Date  . Laparoscopic gastric banding  01/29/09  . Cholecystectomy  2007  . Arthroscopic surgery      Dr Sabra Heck  . Myomectomy  9/04  . Dilation and curettage of uterus  8/04  . Breast surgery  4 /05    biopsy  . Vaginal hysterectomy  06/11/03   Family History  Problem Relation Age of Onset  . Lung cancer Mother   . Arthritis Mother   . Stroke Mother   . Hypertension Mother   . Colon polyps Mother   . Thyroid disease Sister     graves disease  . Colon cancer      maternal grandparent  . Breast cancer Neg Hx   . Hyperlipidemia Father   . Stroke Maternal Aunt   . Stroke Maternal Uncle   . Cancer Maternal Grandmother     colon  . Alcohol abuse Cousin    Social History   Social History  . Marital Status: Single    Spouse Name: N/A  . Number of Children: 0  . 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 Smoking status:  Never Smoker   . Smokeless tobacco: Never Used  . Alcohol Use: 0.0 oz/week    0 Standard drinks or equivalent per week     Comment: occasional  . Drug Use: No  . Sexual Activity: Not Asked   Other Topics Concern  . None   Social History Narrative    Outpatient Encounter Prescriptions as of 03/25/2015  Medication Sig  . CALCIUM PO Take 300 mg by mouth 4 (four) times daily.   Marland Kitchen losartan (COZAAR) 100 MG tablet Take 1 tablet (100 mg total) by mouth daily.  . Multiple Vitamin (MULTIVITAMIN PO) Take by mouth daily.    . pantoprazole (PROTONIX) 40 MG tablet Take 1 tablet by mouth  twice daily before a meal  . venlafaxine XR (EFFEXOR-XR) 37.5 MG 24 hr capsule Take 1 capsule (37.5 mg total) by mouth daily with breakfast.  . [DISCONTINUED] losartan (COZAAR) 100 MG tablet Take 1 tablet (100 mg total) by mouth daily.  . [DISCONTINUED] omeprazole (PRILOSEC) 20 MG capsule Take 20 mg by mouth daily.  . [DISCONTINUED] venlafaxine XR (EFFEXOR-XR) 37.5 MG 24 hr capsule Take 1 capsule (37.5 mg total) by  mouth daily with breakfast.  . rosuvastatin (CRESTOR) 5 MG tablet Take 1 tablet (5 mg total) by mouth daily.   No facility-administered encounter medications on file as of 03/25/2015.    Review of Systems  Constitutional: Negative for appetite change and unexpected weight change.  HENT: Negative for congestion, sinus pressure and sore throat.   Eyes: Negative for pain and visual disturbance.  Respiratory: Negative for cough, chest tightness and shortness of breath.   Cardiovascular: Negative for chest pain, palpitations and leg swelling.  Gastrointestinal: Negative for nausea, vomiting, abdominal pain and diarrhea.  Genitourinary: Negative for dysuria and difficulty urinating.  Musculoskeletal: Negative for back pain and joint swelling.  Skin: Negative for color change and rash.  Neurological: Negative for dizziness and headaches.  Hematological: Negative for adenopathy. Does not bruise/bleed easily.   Psychiatric/Behavioral: Negative for dysphoric mood and agitation.       Objective:    Physical Exam  Constitutional: She is oriented to person, place, and time. She appears well-developed and well-nourished.  HENT:  Nose: Nose normal.  Mouth/Throat: Oropharynx is clear and moist.  Eyes: Right eye exhibits no discharge. Left eye exhibits no discharge. No scleral icterus.  Neck: Neck supple. No thyromegaly present.  Cardiovascular: Normal rate and regular rhythm.   Pulmonary/Chest: Breath sounds normal. No accessory muscle usage. No tachypnea. No respiratory distress. She has no decreased breath sounds. She has no wheezes. She has no rhonchi. Right breast exhibits no inverted nipple, no mass, no nipple discharge and no tenderness (no axillary adenopathy). Left breast exhibits no inverted nipple, no mass, no nipple discharge and no tenderness (no axilarry adenopathy).  Abdominal: Soft. Bowel sounds are normal. There is no tenderness.  Musculoskeletal: She exhibits no edema or tenderness.  Lymphadenopathy:    She has no cervical adenopathy.  Neurological: She is alert and oriented to person, place, and time.  Skin: Skin is warm. No rash noted. No erythema.  Psychiatric: She has a normal mood and affect. Her behavior is normal.    BP 120/80 mmHg  Pulse 64  Temp(Src) 98.2 F (36.8 C) (Oral)  Resp 18  Ht 5' 4.25" (1.632 m)  Wt 289 lb 8 oz (131.316 kg)  BMI 49.30 kg/m2  SpO2 97%  LMP 06/11/2003 Wt Readings from Last 3 Encounters:  03/25/15 289 lb 8 oz (131.316 kg)  12/21/14 280 lb 4 oz (127.121 kg)  11/19/14 274 lb 8 oz (124.512 kg)     Lab Results  Component Value Date   WBC 6.8 03/19/2015   HGB 14.4 03/01/2014   HCT 40.0 03/19/2015   PLT 292 03/19/2015   GLUCOSE 85 03/19/2015   CHOL 220* 03/19/2015   TRIG 90 03/19/2015   HDL 57 03/19/2015   LDLCALC 145* 03/19/2015   ALT 12 03/19/2015   AST 18 03/19/2015   NA 142 03/19/2015   K 4.7 03/19/2015   CL 102 03/19/2015    CREATININE 0.78 03/19/2015   BUN 17 03/19/2015   CO2 24 03/19/2015   TSH 6.990* 03/19/2015   HGBA1C 5.3 03/19/2015    Dg Chest 2 View  12/09/2014  CLINICAL DATA:  Pneumonia. EXAM: CHEST  2 VIEW COMPARISON:  11/08/2014 FINDINGS: Prior right basilar airspace opacity has resolved. Mild enlargement of the cardiopericardial silhouette, without edema. Mild thoracic spondylosis.  No pleural effusion. Tubing projects over the left upper quadrant. IMPRESSION: 1. Prior pneumonia has resolved. 2. Mild enlargement of the cardiopericardial silhouette Electronically Signed   By: Van Clines M.D.   On:  12/09/2014 09:46       Assessment & Plan:   Problem List Items Addressed This Visit    GERD (gastroesophageal reflux disease)    No problems swallowing.  No acid reflux.  EGD 04/23/14.  Doing well on protonix.        Hypercholesterolemia    Low cholesterol diet and exercise.  Persistent cholesterol elevation.  Start crestor 5mg  qod.  Follow lipid panel and liver function tests.        Relevant Medications   losartan (COZAAR) 100 MG tablet   rosuvastatin (CRESTOR) 5 MG tablet   Hypertension - Primary    Blood pressure under good control.  Continue same medication regimen.  Follow pressures.  Follow metabolic panel.        Relevant Medications   losartan (COZAAR) 100 MG tablet   rosuvastatin (CRESTOR) 5 MG tablet   Menopausal syndrome    Doing well on effexor.  Follow.       Severe obesity (BMI >= 40) (HCC)    Discussed diet and exercise.  Follow.  She plans to start exercising.            Einar Pheasant, MD

## 2015-03-25 NOTE — Progress Notes (Signed)
Pre-visit discussion using our clinic review tool. No additional management support is needed unless otherwise documented below in the visit note.  

## 2015-03-28 ENCOUNTER — Encounter: Payer: Self-pay | Admitting: Internal Medicine

## 2015-03-28 NOTE — Assessment & Plan Note (Signed)
Low cholesterol diet and exercise.  Persistent cholesterol elevation.  Start crestor 5mg  qod.  Follow lipid panel and liver function tests.

## 2015-03-28 NOTE — Assessment & Plan Note (Signed)
Discussed diet and exercise.  Follow.  She plans to start exercising.

## 2015-03-28 NOTE — Assessment & Plan Note (Signed)
No problems swallowing.  No acid reflux.  EGD 04/23/14.  Doing well on protonix.

## 2015-03-28 NOTE — Assessment & Plan Note (Signed)
Blood pressure under good control.  Continue same medication regimen.  Follow pressures.  Follow metabolic panel.   

## 2015-03-28 NOTE — Assessment & Plan Note (Signed)
Doing well on effexor.  Follow.   

## 2015-04-01 ENCOUNTER — Ambulatory Visit
Admission: RE | Admit: 2015-04-01 | Discharge: 2015-04-01 | Disposition: A | Discharge status: Home / Self Care | Payer: 59 | Source: Ambulatory Visit | Attending: Internal Medicine | Admitting: Internal Medicine

## 2015-04-01 ENCOUNTER — Encounter: Payer: Self-pay | Admitting: Internal Medicine

## 2015-04-01 DIAGNOSIS — Z1231 Encounter for screening mammogram for malignant neoplasm of breast: Secondary | ICD-10-CM | POA: Diagnosis not present

## 2015-04-01 DIAGNOSIS — Z1239 Encounter for other screening for malignant neoplasm of breast: Secondary | ICD-10-CM

## 2015-04-01 DIAGNOSIS — Z0000 Encounter for general adult medical examination without abnormal findings: Secondary | ICD-10-CM | POA: Insufficient documentation

## 2015-04-22 ENCOUNTER — Ambulatory Visit (INDEPENDENT_AMBULATORY_CARE_PROVIDER_SITE_OTHER): Payer: 59 | Admitting: Podiatry

## 2015-04-22 ENCOUNTER — Encounter: Payer: Self-pay | Admitting: Podiatry

## 2015-04-22 ENCOUNTER — Ambulatory Visit (INDEPENDENT_AMBULATORY_CARE_PROVIDER_SITE_OTHER): Payer: 59

## 2015-04-22 VITALS — BP 118/73 | HR 70 | Resp 18

## 2015-04-22 DIAGNOSIS — R52 Pain, unspecified: Secondary | ICD-10-CM

## 2015-04-22 DIAGNOSIS — M779 Enthesopathy, unspecified: Secondary | ICD-10-CM | POA: Diagnosis not present

## 2015-04-22 DIAGNOSIS — Q6622 Congenital metatarsus adductus: Secondary | ICD-10-CM | POA: Diagnosis not present

## 2015-04-22 DIAGNOSIS — Q66229 Congenital metatarsus adductus, unspecified foot: Secondary | ICD-10-CM

## 2015-04-22 NOTE — Progress Notes (Signed)
Subjective:    Patient ID: Kristine Hendricks, female    DOB: 04/01/1965, 49 y.o.   MRN: [id number]  HPI   49-year-old female presents the office they for pain to her right foot which comes and goes has been ongoing for about 6 months. 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 She states that she has pain after she stands for quite some time. Majority the pain is the right  Side at top and the side on the outside. There is been no swelling or redness. No tingling or numbness. No recent injury or trauma. No previous treatment. No other complaints at this time.   Review of Systems  All other systems reviewed and are negative.      Objective:   Physical Exam General: AAO x3, NAD  Dermatological: Skin is warm, dry and supple bilateral. Nails x 10 are well manicured; remaining integument appears unremarkable at this time. There are no open sores, no preulcerative lesions, no rash or signs of infection present.  Vascular: Dorsalis Pedis artery and Posterior Tibial artery pedal pulses are 2/4 bilateral with immedate capillary fill time. Pedal hair growth present. No varicosities and no lower extremity edema present bilateral. There is no pain with calf compression, swelling, warmth, erythema.   Neruologic: Grossly intact via light touch bilateral. Vibratory intact via tuning fork bilateral. Protective threshold with Semmes Wienstein monofilament intact to all pedal sites bilateral. Patellar and Achilles deep tendon reflexes 2+ bilateral. No Babinski or clonus noted bilateral.   Musculoskeletal:  There is mild tenderness to palpation of the dorsal into the dorsal lateral aspect of the right foot along the elbow at the Lisfranc joint. There is no specific area pinpoint bony tenderness or pain the vibratory sensation. There is no overlying edema, erythema, increase in warmth. Upon weightbearing,  There does appear to be met adductus present and  intoeing mildly. MMT 5/5, ROM WNL   Gait: Unassisted, Nonantalgic.      Assessment & Plan:   49-year-old female right foot and adductus, midfoot pain likely biomechanical in nature. -Treatment options discussed including all alternatives, risks, and complications -Etiology of symptoms were discussed -X-rays were obtained and reviewed with the patient.  There is no definitive evidence of acute fracture or stress fracture. Met adductus is present. -Given her history foot structure she would likely benefit from an orthotic. She elected to proceed with this. She was scanned for orthotics and they were sent to [facility].  -Anti-inflammatories as needed -Follow-up in 4 weeks to [facility] or sooner if any problems arise. In the meantime, encouraged to call the office with any questions, concerns, change in symptoms.   Matthew Wagoner, DPM 

## 2015-04-27 ENCOUNTER — Other Ambulatory Visit: Payer: Self-pay | Admitting: Internal Medicine

## 2015-04-28 LAB — HEPATIC FUNCTION PANEL
ALBUMIN: 4.2 g/dL (ref 3.5–5.5)
ALT: 16 IU/L (ref 0–32)
AST: 20 IU/L (ref 0–40)
Alkaline Phosphatase: 86 IU/L (ref 39–117)
BILIRUBIN TOTAL: 0.3 mg/dL (ref 0.0–1.2)
BILIRUBIN, DIRECT: 0.1 mg/dL (ref 0.00–0.40)
TOTAL PROTEIN: 6.7 g/dL (ref 6.0–8.5)

## 2015-04-28 LAB — TSH: TSH: 5.77 u[IU]/mL — ABNORMAL HIGH (ref 0.450–4.500)

## 2015-04-30 ENCOUNTER — Encounter: Payer: Self-pay | Admitting: Internal Medicine

## 2015-05-03 ENCOUNTER — Telehealth: Payer: Self-pay | Admitting: *Deleted

## 2015-05-03 NOTE — Telephone Encounter (Signed)
Please advise a place on Dr. Bary Leriche schedule for a 30 min appt. Patient has a chest rash, joint pain and unexplained weight gain. She only wants to be seen by Dr. Nicki Reaper.  Thanks

## 2015-05-03 NOTE — Telephone Encounter (Signed)
Can put her in at 10:00 on 05/06/15 - 30 minute.  Denisa is using the Friday appt.  Thanks

## 2015-05-03 NOTE — Telephone Encounter (Signed)
Jessica please advise with Dr. Bary Leriche response

## 2015-05-03 NOTE — Telephone Encounter (Signed)
Could I use one of the slots on Thursday or Friday this week for this patient?

## 2015-05-06 ENCOUNTER — Other Ambulatory Visit: Payer: Self-pay | Admitting: Internal Medicine

## 2015-05-06 ENCOUNTER — Encounter: Payer: Self-pay | Admitting: Internal Medicine

## 2015-05-06 ENCOUNTER — Ambulatory Visit (INDEPENDENT_AMBULATORY_CARE_PROVIDER_SITE_OTHER): Payer: 59 | Admitting: Internal Medicine

## 2015-05-06 VITALS — BP 118/78 | HR 66 | Temp 99.0°F | Resp 14 | Ht 64.5 in | Wt 294.0 lb

## 2015-05-06 DIAGNOSIS — I10 Essential (primary) hypertension: Secondary | ICD-10-CM

## 2015-05-06 DIAGNOSIS — R5383 Other fatigue: Secondary | ICD-10-CM

## 2015-05-06 DIAGNOSIS — K2100 Gastro-esophageal reflux disease with esophagitis, without bleeding: Secondary | ICD-10-CM

## 2015-05-06 DIAGNOSIS — L931 Subacute cutaneous lupus erythematosus: Secondary | ICD-10-CM

## 2015-05-06 DIAGNOSIS — E7800 Pure hypercholesterolemia, unspecified: Secondary | ICD-10-CM

## 2015-05-06 NOTE — Progress Notes (Signed)
Pre visit review using our clinic review tool, if applicable. No additional management support is needed unless otherwise documented below in the visit note. 

## 2015-05-06 NOTE — Progress Notes (Signed)
Patient ID: Kristine Hendricks, female   DOB: 1965/04/16, 50 y.o.   MRN: [id number]   Subjective:    Patient ID: Kristine Hendricks, female    DOB: 07-Feb-1966, 50 y.o.   MRN: [id number]  HPI  Patient with past history of hypercholesterolemia, GERD and hypertension.  She comes in today as a work in with concerns regarding muscle aching and joint pain.  Aching like has flu.  No fever or chills.  Had rash on chest.  Previous biopsy - subcutaneous lupus.  Increased fatigue.  Weight gain.  Is exercising.  Trying to watch her diet.  She also reports some sinus pressure.  Just started in the last 24 hours.  Some sinus headache related.  No chest congestion.  No increased cough.  No sob.     Past Medical History  Diagnosis Date  . Hypertension   . Hypercholesterolemia   . Seasonal allergies   . Sleep apnea   . History of chicken pox   . Asthma   . GERD (gastroesophageal reflux disease)   . Anemia    Past Surgical History  Procedure Laterality Date  . Laparoscopic gastric banding  01/29/09  . Cholecystectomy  2007  . Arthroscopic surgery      Dr Sabra Heck  . Myomectomy  9/04  . Dilation and curettage of uterus  8/04  . Breast surgery  4 /05    biopsy  . Vaginal hysterectomy  06/11/03  . Breast cyst aspiration Right 2005   Family History  Problem Relation Age of Onset  . Lung cancer Mother   . Arthritis Mother   . Stroke Mother   . Hypertension Mother   . Colon polyps Mother   . Thyroid disease Sister     graves disease  . Colon cancer      maternal grandparent  . Breast cancer Neg Hx   . Hyperlipidemia Father   . Stroke Maternal Aunt   . Stroke Maternal Uncle   . Cancer Maternal Grandmother     colon  . Alcohol abuse Cousin    Social History   Social History  . Marital Status: Single    Spouse Name: N/A  . Number of Children: 0  . 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 Smoking status: Never Smoker   . Smokeless  tobacco: Never Used  . Alcohol Use: 0.0 oz/week    0 Standard drinks or equivalent per week     Comment: occasional  . Drug Use: No  . Sexual Activity: Not Asked   Other Topics Concern  . None   Social History Narrative    Outpatient Encounter Prescriptions as of 05/06/2015  Medication Sig  . CALCIUM PO Take 300 mg by mouth 4 (four) times daily.   Marland Kitchen losartan (COZAAR) 100 MG tablet Take 1 tablet (100 mg total) by mouth daily.  . Multiple Vitamin (MULTIVITAMIN PO) Take by mouth daily.    . pantoprazole (PROTONIX) 40 MG tablet Take 1 tablet by mouth  twice daily before a meal  . rosuvastatin (CRESTOR) 5 MG tablet Take 1 tablet (5 mg total) by mouth daily.  Marland Kitchen venlafaxine XR (EFFEXOR-XR) 37.5 MG 24 hr capsule Take 1 capsule (37.5 mg total) by mouth daily with breakfast.   No facility-administered encounter medications on file as of 05/06/2015.    Review of Systems  Constitutional: Negative for appetite change.       Weight gain  as outlined.  Trying to watch her diet.    HENT: Positive for congestion and sinus pressure.   Respiratory: Negative for cough, chest tightness and shortness of breath.   Cardiovascular: Negative for chest pain, palpitations and leg swelling.  Gastrointestinal: Negative for nausea, vomiting, abdominal pain and diarrhea.  Genitourinary: Negative for dysuria and difficulty urinating.  Musculoskeletal:       Aching - muscle and joint aching as outlined.    Skin: Positive for rash. Negative for color change.       Anterior chest rash - erythematous patch.    Neurological: Negative for dizziness and light-headedness.  Psychiatric/Behavioral: Negative for dysphoric mood and agitation.       Objective:    Physical Exam  Constitutional: She appears well-developed and well-nourished.  HENT:  Nose: Nose normal.  Mouth/Throat: Oropharynx is clear and moist.  Neck: Neck supple. No thyromegaly present.  Cardiovascular: Normal rate and regular rhythm.     Pulmonary/Chest: Breath sounds normal. No respiratory distress. She has no wheezes.  Abdominal: Soft. Bowel sounds are normal. There is no tenderness.  Musculoskeletal: She exhibits no edema or tenderness.  Lymphadenopathy:    She has no cervical adenopathy.  Skin: Skin is warm. Rash noted.  Psychiatric: She has a normal mood and affect. Her behavior is normal.    BP 118/78 mmHg  Pulse 66  Temp(Src) 99 F (37.2 C) (Oral)  Resp 14  Ht 5' 4.5" (1.638 m)  Wt 294 lb (133.358 kg)  BMI 49.70 kg/m2  SpO2 97%  LMP 06/11/2003 Wt Readings from Last 3 Encounters:  05/06/15 294 lb (133.358 kg)  03/25/15 289 lb 8 oz (131.316 kg)  12/21/14 280 lb 4 oz (127.121 kg)     Lab Results  Component Value Date   WBC 8.5 05/06/2015   HGB 14.4 03/01/2014   HCT 40.7 05/06/2015   PLT 293 05/06/2015   GLUCOSE 79 05/06/2015   CHOL 220* 03/19/2015   TRIG 90 03/19/2015   HDL 57 03/19/2015   LDLCALC 145* 03/19/2015   ALT 16 04/27/2015   AST 20 04/27/2015   NA 142 05/06/2015   K 4.7 05/06/2015   CL 99 05/06/2015   CREATININE 0.77 05/06/2015   BUN 19 05/06/2015   CO2 25 05/06/2015   TSH 5.770* 04/27/2015   HGBA1C 5.3 03/19/2015    Mm Digital Screening Bilateral  04/01/2015  CLINICAL DATA:  Screening. EXAM: DIGITAL SCREENING BILATERAL MAMMOGRAM WITH CAD COMPARISON:  Previous exam(s). ACR Breast Density Category b: There are scattered areas of fibroglandular density. FINDINGS: There are no findings suspicious for malignancy. Images were processed with CAD. IMPRESSION: No mammographic evidence of malignancy. A result letter of this screening mammogram will be mailed directly to the patient. RECOMMENDATION: Screening mammogram in one year. (Code:SM-B-01Y) BI-RADS CATEGORY  1: Negative. Electronically Signed   By: Margarette Canada M.D.   On: 04/01/2015 08:57       Assessment & Plan:   Problem List Items Addressed This Visit    Fatigue    Felt to be multifactorial.  Continue diet and exercise.  Stop  crestor.  Check labs as outlined.  Further w/up pending results.        GERD (gastroesophageal reflux disease)    No acid reflux.  No problems swallowing.  Follow.  Had EGD 04/23/14.  On protonix.        Hypercholesterolemia    Just recently started crestor.  With the increased body aches, will stop crestor.  Need to confirm  statin medication not contributing.        Hypertension - Primary    Blood pressure has been doing well.  Same medication regimen. Follow pressures.  Follow metabolic panel.       Severe obesity (BMI >= 40) ([place])    Has gained weight.  Discussed diet and exercise.  Follow.        Subacute cutaneous lupus erythematosus    With biopsy proven subacute cutaneous lupus.  Now with joint and muscle aches.  Will check esr, ana, etc.  Rash improving.            Einar Pheasant, MD

## 2015-05-07 LAB — CBC WITH DIFFERENTIAL/PLATELET
BASOS ABS: 0.1 10*3/uL (ref 0.0–0.2)
Basos: 1 %
EOS (ABSOLUTE): 0.1 10*3/uL (ref 0.0–0.4)
Eos: 1 %
HEMOGLOBIN: 13.6 g/dL (ref 11.1–15.9)
Hematocrit: 40.7 % (ref 34.0–46.6)
Immature Grans (Abs): 0 10*3/uL (ref 0.0–0.1)
Immature Granulocytes: 0 %
LYMPHS ABS: 1.6 10*3/uL (ref 0.7–3.1)
LYMPHS: 19 %
MCH: 28.8 pg (ref 26.6–33.0)
MCHC: 33.4 g/dL (ref 31.5–35.7)
MCV: 86 fL (ref 79–97)
MONOCYTES: 8 %
MONOS ABS: 0.7 10*3/uL (ref 0.1–0.9)
NEUTROS ABS: 6 10*3/uL (ref 1.4–7.0)
Neutrophils: 71 %
PLATELETS: 293 10*3/uL (ref 150–379)
RBC: 4.72 x10E6/uL (ref 3.77–5.28)
RDW: 14.3 % (ref 12.3–15.4)
WBC: 8.5 10*3/uL (ref 3.4–10.8)

## 2015-05-07 LAB — BASIC METABOLIC PANEL
BUN/Creatinine Ratio: 25 — ABNORMAL HIGH (ref 9–23)
BUN: 19 mg/dL (ref 6–24)
CALCIUM: 9.6 mg/dL (ref 8.7–10.2)
CO2: 25 mmol/L (ref 18–29)
Chloride: 99 mmol/L (ref 96–106)
Creatinine, Ser: 0.77 mg/dL (ref 0.57–1.00)
GFR, EST AFRICAN AMERICAN: 104 mL/min/{1.73_m2} (ref >59)
GFR, EST NON AFRICAN AMERICAN: 90 mL/min/{1.73_m2} (ref >59)
Glucose: 79 mg/dL (ref 65–99)
POTASSIUM: 4.7 mmol/L (ref 3.5–5.2)
Sodium: 142 mmol/L (ref 134–144)

## 2015-05-07 LAB — URINALYSIS, ROUTINE W REFLEX MICROSCOPIC
Bilirubin, UA: NEGATIVE
Glucose, UA: NEGATIVE
KETONES UA: NEGATIVE
LEUKOCYTES UA: NEGATIVE
Nitrite, UA: NEGATIVE
PROTEIN UA: NEGATIVE
RBC, UA: NEGATIVE
SPEC GRAV UA: 1.018 (ref 1.005–1.030)
Urobilinogen, Ur: 0.2 mg/dL (ref 0.2–1.0)
pH, UA: 7.5 (ref 5.0–7.5)

## 2015-05-07 LAB — ANA: Anti Nuclear Antibody(ANA): NEGATIVE

## 2015-05-07 LAB — CK: CK TOTAL: 90 U/L (ref 24–173)

## 2015-05-07 LAB — SEDIMENTATION RATE: Sed Rate: 6 mm/hr (ref 0–40)

## 2015-05-09 ENCOUNTER — Encounter: Payer: Self-pay | Admitting: Internal Medicine

## 2015-05-09 NOTE — Assessment & Plan Note (Signed)
Blood pressure has been doing well.  Same medication regimen.  Follow pressures.  Follow metabolic panel.   

## 2015-05-09 NOTE — Assessment & Plan Note (Signed)
No acid reflux.  No problems swallowing.  Follow.  Had EGD 04/23/14.  On protonix.

## 2015-05-09 NOTE — Assessment & Plan Note (Signed)
Just recently started crestor.  With the increased body aches, will stop crestor.  Need to confirm statin medication not contributing.

## 2015-05-09 NOTE — Assessment & Plan Note (Signed)
Has gained weight.  Discussed diet and exercise.  Follow.   

## 2015-05-09 NOTE — Assessment & Plan Note (Signed)
Felt to be multifactorial.  Continue diet and exercise.  Stop crestor.  Check labs as outlined.  Further w/up pending results.

## 2015-05-09 NOTE — Assessment & Plan Note (Signed)
With biopsy proven subacute cutaneous lupus.  Now with joint and muscle aches.  Will check esr, ana, etc.  Rash improving.

## 2015-05-19 ENCOUNTER — Ambulatory Visit (INDEPENDENT_AMBULATORY_CARE_PROVIDER_SITE_OTHER): Payer: 59 | Admitting: *Deleted

## 2015-05-19 DIAGNOSIS — Q66229 Congenital metatarsus adductus, unspecified foot: Secondary | ICD-10-CM

## 2015-05-19 DIAGNOSIS — Q6622 Congenital metatarsus adductus: Secondary | ICD-10-CM

## 2015-05-19 DIAGNOSIS — M779 Enthesopathy, unspecified: Secondary | ICD-10-CM

## 2015-05-19 NOTE — Progress Notes (Signed)
Dispensed patient's orthotics with oral and written instructions for wearing. Patient will follow up with Dr. Wagoner in 1 month for an orthotic check.  

## 2015-06-17 ENCOUNTER — Encounter: Payer: Self-pay | Admitting: Podiatry

## 2015-06-17 ENCOUNTER — Ambulatory Visit (INDEPENDENT_AMBULATORY_CARE_PROVIDER_SITE_OTHER): Payer: 59 | Admitting: Podiatry

## 2015-06-17 VITALS — BP 137/80 | HR 60 | Resp 18

## 2015-06-17 DIAGNOSIS — M779 Enthesopathy, unspecified: Secondary | ICD-10-CM | POA: Diagnosis not present

## 2015-06-17 NOTE — Patient Instructions (Signed)
Peroneal Tendinitis With Rehab Tendonitis is inflammation of a tendon. Inflammation of the tendons on the back of the outer ankle (peroneal tendons) is known as peroneal tendonitis. The peroneal tendons are responsible for connecting the muscles that allow you to stand on your tiptoes to the bones of the ankle. For this reason, peroneal tendonitis often causes pain when trying to complete such motions. Peroneal tendonitis often involves a tear (strain) of the peroneal tendons. Strains are classified into three categories. Grade 1 strains cause pain, but the tendon is not lengthened. Grade 2 strains include a lengthened ligament, due to the ligament being stretched or partially ruptured. With grade 2 strains there is still function, although function may be decreased. Grade 3 strains involve a complete tear of the tendon or muscle, and function is usually impaired. SYMPTOMS   Pain, tenderness, swelling, warmth, or redness over the back of the outer side of the ankle, the outer part of the mid-foot, or the bottom of the arch.  Pain that gets worse with ankle motion (especially when pushing off or pushing down with the front of the foot), or when standing on the ball of the foot or pushing the foot outward.  Crackling sound (crepitation) when the tendon is moved or touched. CAUSES  Peroneal tendinitis occurs when injury to the peroneal tendons causes the body to respond with inflammation. Common causes of injury include:  An overuse injury, in which the groove behind the outer ankle (where the tendon is located) causes wear on the tendon.  A sudden stress placed on the tendon, such as from an increase in the intensity, frequency, or duration of training.  Direct hit (trauma) to the tendon.  Return to activity too soon after a previous ankle injury. RISK INCREASES WITH:  Sports that require sudden, repetitive pushing off of the foot, such as jumping or quick starts.  Kicking and running sports,  especially running down hills or long distances.  Poor strength and flexibility.  Previous injury to the foot, ankle, or leg. PREVENTION  Warm up and stretch properly before activity.  Allow for adequate recovery between workouts.  Maintain physical fitness:  Strength, flexibility, and endurance.  Cardiovascular fitness.  Complete rehabilitation after previous injury. PROGNOSIS  If treated properly, peroneal tendonitis usually heals within 6 weeks.  RELATED COMPLICATIONS  Longer healing time, if not properly treated or if not given enough time to heal.  Recurring symptoms if activity is resumed too soon, with overuse, or when using poor technique.  If untreated, tendinitis may result in tendon rupture, requiring surgery. TREATMENT  Treatment first involves the use of ice and medicine to reduce pain and inflammation. The use of strengthening and stretching exercises may help reduce pain with activity. These exercises may be performed at unsuccessful, surgery to remove the inflamed tendon lining (sheath) may be advised.  MEDICATION   If pain medicine is needed, nonsteroidal anti-inflammatory medicines (aspirin and ibuprofen), or other minor pain relievers (acetaminophen), are often advised.  Do not take pain medicine for 7 days before surgery.  Prescription pain relievers may be given, if your caregiver thinks they are needed. Use only as directed and only as much as you need. HEAT AND COLD  Cold treatment (icing) should be applied for 10 to 15 minutes every 2 to 3 hours for inflammation and pain, and immediately after activity that aggravates your symptoms. Use ice packs or an ice massage.  Heat treatment may be used before performing stretching and strengthening activities prescribed by your   caregiver, physical therapist, or athletic trainer. Use a heat pack or a warm water soak. SEEK MEDICAL CARE IF:  Symptoms get worse or do not improve in 2 to 4 weeks, despite  treatment.  New, unexplained symptoms develop. (Drugs used in treatment may produce side effects.) EXERCISES RANGE OF MOTION (ROM) AND STRETCHING EXERCISES - Peroneal Tendinitis These exercises may help you when beginning to rehabilitate your injury. Your symptoms may resolve with or without further involvement from your physician, physical therapist or athletic trainer. While completing these exercises, remember:   Restoring tissue flexibility helps normal motion to return to the joints. This allows healthier, less painful movement and activity.  An effective stretch should be held for at least 30 seconds.  A stretch should never be painful. You should only feel a gentle lengthening or release in the stretched tissue. RANGE OF MOTION - Ankle Eversion  Sit with your right / left ankle crossed over your opposite knee.  Grip your foot with your opposite hand, placing your thumb on the top of your foot and your fingers across the bottom of your foot.  Gently push your foot downward with a slight rotation, so your littlest toes rise slightly toward the ceiling.  You should feel a gentle stretch on the inside of your ankle. Hold the stretch for __________ seconds. Repeat __________ times. Complete this exercise __________ times per day.  RANGE OF MOTION - Ankle Inversion  Sit with your right / left ankle crossed over your opposite knee.  Grip your foot with your opposite hand, placing your thumb on the bottom of your foot and your fingers across the top of your foot.  Gently pull your foot so the smallest toe comes toward you and your thumb pushes the inside of the ball of your foot away from you.  You should feel a gentle stretch on the outside of your ankle. Hold the stretch for __________ seconds. Repeat __________ times. Complete this exercise __________ times per day.  RANGE OF MOTION - Ankle Plantar Flexion  Sit with your right / left leg crossed over your opposite knee.  Use  your opposite hand to pull the top of your foot and toes toward you.  You should feel a gentle stretch on the top of your foot and ankle. Hold this position for __________ seconds. Repeat __________ times. Complete __________ times per day.  STRETCH - Gastroc, Standing  Place your hands on a wall.  Extend your right / left leg behind you, keeping the front knee somewhat bent.  Slightly point your toes inward on your back foot.  Keeping your right / left heel on the floor and your knee straight, shift your weight toward the wall, not allowing your back to arch.  You should feel a gentle stretch in the calf. Hold this position for __________ seconds. Repeat __________ times. Complete this stretch __________ times per day. STRETCH - Soleus, Standing  Place your hands on a wall.  Extend your right / left leg behind you, keeping the other knee somewhat bent.  Slightly point your toes inward on your back foot.  Keep your heel on the floor, bend your back knee, and slightly shift your weight over the back leg so that you feel a gentle stretch deep in your back calf.  Hold this position for __________ seconds. Repeat __________ times. Complete this stretch __________ times per day. STRETCH - Gastrocsoleus, Standing Note: This exercise can place a lot of stress on your foot and ankle. Please   complete this exercise only if specifically instructed by your caregiver.   Place the ball of your right / left foot on a step, keeping your other foot firmly on the same step.  Hold on to the wall or a rail for balance.  Slowly lift your other foot, allowing your body weight to press your heel down over the edge of the step.  You should feel a stretch in your right / left calf.  Hold this position for __________ seconds.  Repeat this exercise with a slight bend in your knee. Repeat __________ times. Complete this stretch __________ times per day.  STRENGTHENING EXERCISES - Peroneal Tendinitis   These exercises may help you when beginning to rehabilitate your injury. They may resolve your symptoms with or without further involvement from your physician, physical therapist or athletic trainer. While completing these exercises, remember:   Muscles can gain both the endurance and the strength needed for everyday activities through controlled exercises.  Complete these exercises as instructed by your physician, physical therapist or athletic trainer. Increase the resistance and repetitions only as guided by your caregiver. STRENGTH - Dorsiflexors  Secure a rubber exercise band or tubing to a fixed object (table, pole) and loop the other end around your right / left foot.  Sit on the floor facing the fixed object. The band should be slightly tense when your foot is relaxed.  Slowly draw your foot back toward you, using your ankle and toes.  Hold this position for __________ seconds. Slowly release the tension in the band and return your foot to the starting position. Repeat __________ times. Complete this exercise __________ times per day.  STRENGTH - Towel Curls  Sit in a chair, on a non-carpeted surface.  Place your foot on a towel, keeping your heel on the floor.  Pull the towel toward your heel only by curling your toes. Keep your heel on the floor.  If instructed by your physician, physical therapist or athletic trainer, add weight to the end of the towel. Repeat __________ times. Complete this exercise __________ times per day. STRENGTH - Ankle Eversion   Secure one end of a rubber exercise band or tubing to a fixed object (table, pole). Loop the other end around your foot, just before your toes.  Place your fists between your knees. This will focus your strengthening at your ankle.  Drawing the band across your opposite foot, away from the pole, slowly, pull your little toe out and up. Make sure the band is positioned to resist the entire motion.  Hold this position for  __________ seconds.  Have your muscles resist the band, as it slowly pulls your foot back to the starting position. Repeat __________ times. Complete this exercise __________ times per day.    This information is not intended to replace advice given to you by your health care provider. Make sure you discuss any questions you have with your health care provider.   Document Released: 03/06/2005 Document Revised: 07/21/2014 Document Reviewed: 06/18/2008 Elsevier Interactive Patient Education 2016 [employer]  

## 2015-06-17 NOTE — Progress Notes (Signed)
Patient ID: Kristine Hendricks, female   DOB: 05-30-1965, 50 y.o.   MRN: [id number]  Subjective: 50 year old female presents the office today for follow-up evaluation after getting inserts. She states that since wearing the inserts and get a new shoes that she's had improvement in her symptoms. She gets occasional discomfort in the outside of her right foot this is intermittent in nature. No recent injury or trauma. No swelling or redness. No tingling or numbness. Denies any systemic complaints such as fevers, chills, nausea, vomiting. No acute changes since last appointment, and no other complaints at this time.   Objective: AAO x3, NAD DP/PT pulses palpable bilaterally, CRT less than 3 seconds Protective sensation intact with Simms Weinstein monofilament There is mild to palpation just proximal to the fifth metatarsal base on the course of the peroneal tendon. The peroneal tendon appears to be intact. There is no Tinel's the dorsal aspect of the foot at this time.  No areas of pinpoint bony tenderness or pain with vibratory sensation. MMT 5/5, ROM WNL. No edema, erythema, increase in warmth to bilateral lower extremities.  No open lesions or pre-ulcerative lesions.  No pain with calf compression, swelling, warmth, erythema  Assessment: 50 year old female with improving symptoms with peroneal tendinitis  Plan: -All treatment options discussed with the patient including all alternatives, risks, complications.  -At this time a discussed steroid injection but she declined. Ordered compound cream to use over the area. Also we have excises for peroneal tendinitis which are provided today. Continue with supportive shoe gear and orthotics. Ice to the area. -Follow-up in 4 weeks if symptoms continue or sooner if any issues are to arise. If symptoms continue will likely perform a steroid injection. -Patient encouraged to call the office with any questions, concerns, change in symptoms.   Celesta Gentile,  DPM

## 2015-06-18 ENCOUNTER — Telehealth: Payer: Self-pay | Admitting: *Deleted

## 2015-06-28 ENCOUNTER — Telehealth: Payer: Self-pay | Admitting: *Deleted

## 2015-06-28 MED ORDER — NONFORMULARY OR COMPOUNDED ITEM: Status: DC

## 2015-06-28 NOTE — Telephone Encounter (Signed)
Dr. Jacqualyn Posey ordered [facility] #1.  Faxed.

## 2015-07-01 ENCOUNTER — Ambulatory Visit (INDEPENDENT_AMBULATORY_CARE_PROVIDER_SITE_OTHER): Payer: 59 | Admitting: Internal Medicine

## 2015-07-01 ENCOUNTER — Encounter: Payer: Self-pay | Admitting: Internal Medicine

## 2015-07-01 VITALS — BP 118/77 | HR 54 | Temp 98.3°F | Ht 65.0 in | Wt 300.2 lb

## 2015-07-01 DIAGNOSIS — E7800 Pure hypercholesterolemia, unspecified: Secondary | ICD-10-CM

## 2015-07-01 DIAGNOSIS — R21 Rash and other nonspecific skin eruption: Secondary | ICD-10-CM

## 2015-07-01 DIAGNOSIS — I10 Essential (primary) hypertension: Secondary | ICD-10-CM | POA: Diagnosis not present

## 2015-07-01 DIAGNOSIS — K2100 Gastro-esophageal reflux disease with esophagitis, without bleeding: Secondary | ICD-10-CM

## 2015-07-01 DIAGNOSIS — L931 Subacute cutaneous lupus erythematosus: Secondary | ICD-10-CM | POA: Diagnosis not present

## 2015-07-01 NOTE — Progress Notes (Signed)
Pre visit review using our clinic review tool, if applicable. No additional management support is needed unless otherwise documented below in the visit note. 

## 2015-07-01 NOTE — Progress Notes (Signed)
Patient ID: Kristine Hendricks, female   DOB: [birth year], 50 y.o.   MRN: [id number]   Subjective:    Patient ID: Kristine Hendricks, female    DOB: [birth year], 50 y.o.   MRN: [id number]  HPI  Patient here for a scheduled follow up.  She was having joint and muscle aches.  See last note.  This resolved with stopping crestor.  She had a rash on her face.  Was told biopsy revealed subacute cutaneous lupus.  Saw rheumatology.  No further w/up warranted.  See his note.  She developed a rash on her anterior chest.  Saw dermatology.  Was given a steroid cream.  Resolved.  Not using now.  Rash starting to return and involves her anterior chest and posterior shoulders.  She states dermatology told her rash was from sun exposure.  She would like a second opinion.  She is still gaining weight.  Is trying to watch her diet.  Is exercising.  Reports no acid reflux.  No dysphagia.  No abdominal pain or cramping.  Bowels stable.  Does report corner of her lips - cracking.     Past Medical History  Diagnosis Date  . Hypertension   . Hypercholesterolemia   . Seasonal allergies   . Sleep apnea   . History of chicken pox   . Asthma   . GERD (gastroesophageal reflux disease)   . Anemia    Past Surgical History  Procedure Laterality Date  . Laparoscopic gastric banding  01/29/09  . Cholecystectomy  2007  . Arthroscopic surgery      Dr Sabra Heck  . Myomectomy  9/04  . Dilation and curettage of uterus  8/04  . Breast surgery  4 /05    biopsy  . Vaginal hysterectomy  06/11/03  . Breast cyst aspiration Right 2005   Family History  Problem Relation Age of Onset  . Lung cancer Mother   . Arthritis Mother   . Stroke Mother   . Hypertension Mother   . Colon polyps Mother   . Thyroid disease Sister     graves disease  . Colon cancer      maternal grandparent  . Breast cancer Neg Hx   . Hyperlipidemia Father   . Stroke Maternal Aunt   . Stroke Maternal Uncle   . Cancer Maternal Grandmother     colon  .  Alcohol abuse Cousin    Social History   Social History  . Marital Status: Single    Spouse Name: N/A  . Number of Children: 0  . 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 Smoking status: Never Smoker   . Smokeless tobacco: Never Used  . Alcohol Use: 0.0 oz/week    0 Standard drinks or equivalent per week     Comment: occasional  . Drug Use: No  . Sexual Activity: Not Asked   Other Topics Concern  . None   Social History Narrative    Outpatient Encounter Prescriptions as of 07/01/2015  Medication Sig  . CALCIUM PO Take 300 mg by mouth 4 (four) times daily.   Marland Kitchen losartan (COZAAR) 100 MG tablet Take 1 tablet (100 mg total) by mouth daily.  . Multiple Vitamin (MULTIVITAMIN PO) Take by mouth daily.    . NONFORMULARY OR COMPOUNDED [place] compound:   Combo Pain #1 - Aripiprazole 0.5%, Baclofen 3%, Gabapentin 8%, Ketorolac 8%, lLidocaine 5%, dispense 240 grams, apply  102 pumps to affected area 3-4 times a day, and rub 1-2 minutes, prn refills.  . pantoprazole (PROTONIX) 40 MG tablet Take 1 tablet by mouth  twice daily before a meal  . venlafaxine XR (EFFEXOR-XR) 37.5 MG 24 hr capsule Take 1 capsule (37.5 mg total) by mouth daily with breakfast.  . [DISCONTINUED] rosuvastatin (CRESTOR) 5 MG tablet Take 1 tablet (5 mg total) by mouth daily.   No facility-administered encounter medications on file as of 07/01/2015.    Review of Systems  Constitutional:       Trying to watch her diet.  Increased weight.    HENT: Negative for congestion and sinus pressure.   Respiratory: Negative for cough, chest tightness and shortness of breath.   Cardiovascular: Negative for chest pain, palpitations and leg swelling.  Gastrointestinal: Negative for nausea, vomiting, abdominal pain and diarrhea.  Genitourinary: Negative for dysuria and difficulty urinating.  Musculoskeletal: Negative for back pain and joint swelling.  Skin:  Negative for color change and rash.  Neurological: Negative for dizziness, light-headedness and headaches.  Psychiatric/Behavioral: Negative for dysphoric mood and agitation.       Objective:    Physical Exam  Constitutional: She appears well-developed and well-nourished. No distress.  HENT:  Nose: Nose normal.  Mouth/Throat: Oropharynx is clear and moist.  Neck: Neck supple. No thyromegaly present.  Cardiovascular: Normal rate and regular rhythm.   Pulmonary/Chest: Breath sounds normal. No respiratory distress. She has no wheezes.  Abdominal: Soft. Bowel sounds are normal. There is no tenderness.  Musculoskeletal: She exhibits no edema or tenderness.  Lymphadenopathy:    She has no cervical adenopathy.  Skin: No rash noted. No erythema.  Psychiatric: She has a normal mood and affect. Her behavior is normal.    BP 118/77 mmHg  Pulse 54  Temp(Src) 98.3 F (36.8 C) (Oral)  Ht 5\' 5"  (1.651 m)  Wt 300 lb 4 oz (136.193 kg)  BMI 49.96 kg/m2  SpO2 100%  LMP 06/11/2003 Wt Readings from Last 3 Encounters:  07/01/15 300 lb 4 oz (136.193 kg)  05/06/15 294 lb (133.358 kg)  03/25/15 289 lb 8 oz (131.316 kg)     Lab Results  Component Value Date   WBC 8.5 05/06/2015   HGB 14.4 03/01/2014   HCT 40.7 05/06/2015   PLT 293 05/06/2015   GLUCOSE 79 05/06/2015   CHOL 220* 03/19/2015   TRIG 90 03/19/2015   HDL 57 03/19/2015   LDLCALC 145* 03/19/2015   ALT 16 04/27/2015   AST 20 04/27/2015   NA 142 05/06/2015   K 4.7 05/06/2015   CL 99 05/06/2015   CREATININE 0.77 05/06/2015   BUN 19 05/06/2015   CO2 25 05/06/2015   TSH 5.770* 04/27/2015   HGBA1C 5.3 03/19/2015    Mm Digital Screening Bilateral  04/01/2015  CLINICAL DATA:  Screening. EXAM: DIGITAL SCREENING BILATERAL MAMMOGRAM WITH CAD COMPARISON:  Previous exam(s). ACR Breast Density Category b: There are scattered areas of fibroglandular density. FINDINGS: There are no findings suspicious for malignancy. Images were  processed with CAD. IMPRESSION: No mammographic evidence of malignancy. A result letter of this screening mammogram will be mailed directly to the patient. RECOMMENDATION: Screening mammogram in one year. (Code:SM-B-01Y) BI-RADS CATEGORY  1: Negative. Electronically Signed   By: Margarette Canada M.D.   On: 04/01/2015 08:57       Assessment & Plan:   Problem List Items Addressed This Visit    GERD (gastroesophageal reflux disease)    On protonix.  No  acid reflux.  Swallowing ok.  Follow.        Hypercholesterolemia    Low cholesterol diet and exercise.  Follow lipid panel.  Aches with crestor.  Resolved after starting.        Hypertension - Primary    Blood pressure on her checks have been under good control.  Continue same medication regimen.  Follow pressures.  Follow metabolic panel.        Rash    Persistent reoccurring rash on her anterior chest and upper back.  Unclear etiology.  See above for w/up.  She prefers a second opinion.        Relevant Orders   Ambulatory referral to Dermatology   Severe obesity (BMI >= 40) (HCC)    Is s/p gastric surgery.  Is gaining weight.  Discussed diet and exercise.  She is watching her diet.  Is exercising.  Follow.        Subacute cutaneous lupus erythematosus    Biopsy proven - subacute cutaneous lupus - rash on face.  Saw rheumatology.  Felt no further w/up warranted.            Einar Pheasant, MD

## 2015-07-03 ENCOUNTER — Encounter: Payer: Self-pay | Admitting: Internal Medicine

## 2015-07-03 DIAGNOSIS — R21 Rash and other nonspecific skin eruption: Secondary | ICD-10-CM | POA: Insufficient documentation

## 2015-07-03 NOTE — Assessment & Plan Note (Signed)
Is s/p gastric surgery.  Is gaining weight.  Discussed diet and exercise.  She is watching her diet.  Is exercising.  Follow.

## 2015-07-03 NOTE — Assessment & Plan Note (Signed)
Persistent reoccurring rash on her anterior chest and upper back.  Unclear etiology.  See above for w/up.  She prefers a second opinion.

## 2015-07-03 NOTE — Assessment & Plan Note (Signed)
Blood pressure on her checks have been under good control.  Continue same medication regimen.  Follow pressures.  Follow metabolic panel.

## 2015-07-03 NOTE — Assessment & Plan Note (Signed)
Low cholesterol diet and exercise.  Follow lipid panel.  Aches with crestor.  Resolved after starting.

## 2015-07-03 NOTE — Assessment & Plan Note (Signed)
Biopsy proven - subacute cutaneous lupus - rash on face.  Saw rheumatology.  Felt no further w/up warranted.

## 2015-07-03 NOTE — Assessment & Plan Note (Signed)
On protonix.  No acid reflux.  Swallowing ok.  Follow.

## 2015-07-06 ENCOUNTER — Encounter: Payer: Self-pay | Admitting: Internal Medicine

## 2015-07-06 NOTE — Telephone Encounter (Signed)
Entered in error

## 2015-07-15 ENCOUNTER — Ambulatory Visit: Payer: 59 | Admitting: Podiatry

## 2015-07-16 ENCOUNTER — Other Ambulatory Visit: Payer: Self-pay | Admitting: Internal Medicine

## 2015-07-17 LAB — CBC WITH DIFFERENTIAL/PLATELET
BASOS ABS: 0 10*3/uL (ref 0.0–0.2)
BASOS: 1 %
EOS (ABSOLUTE): 0.2 10*3/uL (ref 0.0–0.4)
Eos: 4 %
HEMATOCRIT: 41.6 % (ref 34.0–46.6)
Hemoglobin: 14.3 g/dL (ref 11.1–15.9)
IMMATURE GRANS (ABS): 0 10*3/uL (ref 0.0–0.1)
IMMATURE GRANULOCYTES: 0 %
LYMPHS: 26 %
Lymphocytes Absolute: 1.4 10*3/uL (ref 0.7–3.1)
MCH: 28.9 pg (ref 26.6–33.0)
MCHC: 34.4 g/dL (ref 31.5–35.7)
MCV: 84 fL (ref 79–97)
MONOS ABS: 0.6 10*3/uL (ref 0.1–0.9)
Monocytes: 11 %
NEUTROS ABS: 3.1 10*3/uL (ref 1.4–7.0)
NEUTROS PCT: 58 %
Platelets: 255 10*3/uL (ref 150–379)
RBC: 4.95 x10E6/uL (ref 3.77–5.28)
RDW: 13.9 % (ref 12.3–15.4)
WBC: 5.4 10*3/uL (ref 3.4–10.8)

## 2015-07-17 LAB — FERRITIN: Ferritin: 113 ng/mL (ref 15–150)

## 2015-07-17 LAB — BASIC METABOLIC PANEL
BUN/Creatinine Ratio: 18 (ref 9–23)
BUN: 14 mg/dL (ref 6–24)
CALCIUM: 9.6 mg/dL (ref 8.7–10.2)
CO2: 26 mmol/L (ref 18–29)
Chloride: 101 mmol/L (ref 96–106)
Creatinine, Ser: 0.78 mg/dL (ref 0.57–1.00)
GFR, EST AFRICAN AMERICAN: 103 mL/min/{1.73_m2} (ref >59)
GFR, EST NON AFRICAN AMERICAN: 89 mL/min/{1.73_m2} (ref >59)
Glucose: 85 mg/dL (ref 65–99)
Potassium: 4.6 mmol/L (ref 3.5–5.2)
Sodium: 142 mmol/L (ref 134–144)

## 2015-07-17 LAB — TSH: TSH: 5.33 u[IU]/mL — AB (ref 0.450–4.500)

## 2015-07-17 LAB — LIPID PANEL W/O CHOL/HDL RATIO
Cholesterol, Total: 234 mg/dL — ABNORMAL HIGH (ref 100–199)
HDL: 52 mg/dL (ref >39)
LDL CALC: 155 mg/dL — AB (ref 0–99)
Triglycerides: 136 mg/dL (ref 0–149)
VLDL CHOLESTEROL CAL: 27 mg/dL (ref 5–40)

## 2015-07-17 LAB — VITAMIN B12: Vitamin B-12: 356 pg/mL (ref 211–946)

## 2015-07-17 LAB — FOLATE: FOLATE: 8.4 ng/mL (ref >3.0)

## 2015-07-19 ENCOUNTER — Other Ambulatory Visit: Payer: Self-pay

## 2015-07-19 NOTE — Telephone Encounter (Signed)
Completed PA for Pantoprazole, denied.

## 2015-07-20 ENCOUNTER — Encounter: Payer: Self-pay | Admitting: Internal Medicine

## 2015-07-21 ENCOUNTER — Ambulatory Visit
Admission: RE | Admit: 2015-07-21 | Discharge: 2015-07-21 | Disposition: A | Discharge status: Home / Self Care | Payer: 59 | Source: Ambulatory Visit | Attending: Family Medicine | Admitting: Family Medicine

## 2015-07-21 ENCOUNTER — Encounter: Payer: Self-pay | Admitting: Family Medicine

## 2015-07-21 ENCOUNTER — Ambulatory Visit (INDEPENDENT_AMBULATORY_CARE_PROVIDER_SITE_OTHER): Payer: 59 | Admitting: Family Medicine

## 2015-07-21 VITALS — BP 124/72 | HR 68 | Temp 98.4°F | Ht 65.0 in | Wt 287.2 lb

## 2015-07-21 DIAGNOSIS — J209 Acute bronchitis, unspecified: Secondary | ICD-10-CM | POA: Insufficient documentation

## 2015-07-21 DIAGNOSIS — R110 Nausea: Secondary | ICD-10-CM | POA: Diagnosis not present

## 2015-07-21 MED ORDER — HYDROCOD POLST-CPM POLST ER 10-8 MG/5ML PO SUER: 5.0000 mL | Freq: Two times a day (BID) | ORAL | Status: DC | PRN

## 2015-07-21 MED ORDER — ONDANSETRON HCL 4 MG PO TABS: 4.0000 mg | ORAL_TABLET | Freq: Three times a day (TID) | ORAL | Status: DC | PRN

## 2015-07-21 MED ORDER — PREDNISONE 50 MG PO TABS: ORAL_TABLET | ORAL | Status: DC

## 2015-07-21 NOTE — Progress Notes (Signed)
Subjective:  Patient ID: Kristine Hendricks, female    DOB: 1965-09-25  Age: 50 y.o. MRN: [id number]  CC: Bronchitis?, Nausea, diarrhea  HPI:  50 year old female presents with the above complaints.  Patient states that she's been sick since the beginning last week. She states that she's been experiencing severe cough. She states her cough is nonproductive. Given the severity of her cough she went to [facility] on Friday and was diagnosed with acute bronchitis. She was treated with albuterol, azithromycin, and Tessalon.  She presents today reporting that she continues to feel poorly. She has also recently developed nausea and diarrhea. She states that one of her coworkers has a same illness. She denies any fever but reports that she's had episodes of profuse sweating. She states that she "feels normal". She's had no improvement with the treatment above. No known exacerbating or relieving factors. She is concerned that she is developing pneumonia.  Social Hx   Social History   Social History  . Marital Status: Single    Spouse Name: N/A  . Number of Children: 0  . 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 Smoking status: Never Smoker   . Smokeless tobacco: Never Used  . Alcohol Use: 0.0 oz/week    0 Standard drinks or equivalent per week     Comment: occasional  . Drug Use: No  . Sexual Activity: Not Asked   Other Topics Concern  . None   Social History Narrative   Review of Systems  Constitutional: Positive for fatigue. Negative for fever.       Sweats.  Respiratory: Positive for cough.   Gastrointestinal: Positive for nausea and diarrhea.    Objective:  BP 124/72 mmHg  Pulse 68  Temp(Src) 98.4 F (36.9 C) (Oral)  Ht 5\' 5"  (1.651 m)  Wt 287 lb 4 oz (130.296 kg)  BMI 47.80 kg/m2  SpO2 98%  LMP 06/11/2003  BP/Weight 07/21/2015 07/01/2015 [id number]  Systolic BP [id number]  Diastolic BP 72 77 80  Wt. (Lbs)  287.25 300.25 -  BMI 47.8 49.96 -   Physical Exam  Constitutional: She appears well-developed.  HENT:  Mouth/Throat: Oropharynx is clear and moist.  Normal TM's bilaterally.  Eyes: Conjunctivae are normal.  Neck: Neck supple.  Cardiovascular: Normal rate and regular rhythm.   Pulmonary/Chest: Effort normal. She has no wheezes. She has no rales.  Vitals reviewed.  Lab Results  Component Value Date   WBC 5.4 07/16/2015   HGB 14.4 03/01/2014   HCT 41.6 07/16/2015   PLT 255 07/16/2015   GLUCOSE 85 07/16/2015   CHOL 234* 07/16/2015   TRIG 136 07/16/2015   HDL 52 07/16/2015   LDLCALC 155* 07/16/2015   ALT 16 04/27/2015   AST 20 04/27/2015   NA 142 07/16/2015   K 4.6 07/16/2015   CL 101 07/16/2015   CREATININE 0.78 07/16/2015   BUN 14 07/16/2015   CO2 26 07/16/2015   TSH 5.330* 07/16/2015   HGBA1C 5.3 03/19/2015    Assessment & Plan:   Problem List Items Addressed This Visit    Acute bronchitis - Primary    New acute problem. Exam and history consistent with acute bronchitis. Chest x-ray was obtained given persistence of symptoms and patient concern for pneumonia. Chest x-ray was negative for infiltrate. Treating with prednisone and Tussionex. No indication for antibodies at this time. She recently completed a course of azithromycin.  Relevant Medications   predniSONE (DELTASONE) 50 MG tablet   chlorpheniramine-HYDROcodone (TUSSIONEX PENNKINETIC ER) 10-8 MG/5ML SUER   Other Relevant Orders   DG Chest 2 View (Completed)   Nausea    Nausea and diarrhea. ? Medication side effect vs secondary to current suspected viral illness. Zofran for nausea.         Meds ordered this encounter  Medications  . predniSONE (DELTASONE) 50 MG tablet    Sig: 1 tablet daily x 5 days.    Dispense:  5 tablet    Refill:  0  . chlorpheniramine-HYDROcodone (TUSSIONEX PENNKINETIC ER) 10-8 MG/5ML SUER    Sig: Take 5 mLs by mouth every 12 (twelve) hours as needed.    Dispense:  115  mL    Refill:  0  . ondansetron (ZOFRAN) 4 MG tablet    Sig: Take 1 tablet (4 mg total) by mouth every 8 (eight) hours as needed for nausea or vomiting.    Dispense:  20 tablet    Refill:  0   Follow-up: PRN  [place]

## 2015-07-21 NOTE — Patient Instructions (Signed)
Take the medication as prescribed.  We will call with the xray results.  Take care  Dr. Lacinda Axon

## 2015-07-21 NOTE — Assessment & Plan Note (Signed)
Nausea and diarrhea. ? Medication side effect vs secondary to current suspected viral illness. Zofran for nausea.

## 2015-07-21 NOTE — Assessment & Plan Note (Signed)
New acute problem. Exam and history consistent with acute bronchitis. Chest x-ray was obtained given persistence of symptoms and patient concern for pneumonia. Chest x-ray was negative for infiltrate. Treating with prednisone and Tussionex. No indication for antibodies at this time. She recently completed a course of azithromycin.

## 2015-07-21 NOTE — Progress Notes (Signed)
Pre visit review using our clinic review tool, if applicable. No additional management support is needed unless otherwise documented below in the visit note. 

## 2015-08-03 ENCOUNTER — Other Ambulatory Visit: Payer: Self-pay | Admitting: Internal Medicine

## 2015-08-03 MED ORDER — PANTOPRAZOLE SODIUM 40 MG PO TBEC: 40.0000 mg | DELAYED_RELEASE_TABLET | Freq: Two times a day (BID) | ORAL | Status: DC

## 2015-09-28 ENCOUNTER — Ambulatory Visit (INDEPENDENT_AMBULATORY_CARE_PROVIDER_SITE_OTHER): Payer: 59 | Admitting: Internal Medicine

## 2015-09-28 ENCOUNTER — Encounter: Payer: Self-pay | Admitting: Internal Medicine

## 2015-09-28 VITALS — BP 110/80 | HR 67 | Temp 98.2°F | Resp 18 | Ht 65.0 in | Wt 299.2 lb

## 2015-09-28 DIAGNOSIS — L931 Subacute cutaneous lupus erythematosus: Secondary | ICD-10-CM

## 2015-09-28 DIAGNOSIS — R21 Rash and other nonspecific skin eruption: Secondary | ICD-10-CM | POA: Diagnosis not present

## 2015-09-28 DIAGNOSIS — E7800 Pure hypercholesterolemia, unspecified: Secondary | ICD-10-CM

## 2015-09-28 DIAGNOSIS — K2100 Gastro-esophageal reflux disease with esophagitis, without bleeding: Secondary | ICD-10-CM

## 2015-09-28 DIAGNOSIS — I10 Essential (primary) hypertension: Secondary | ICD-10-CM

## 2015-09-28 DIAGNOSIS — Z0000 Encounter for general adult medical examination without abnormal findings: Secondary | ICD-10-CM | POA: Diagnosis not present

## 2015-09-28 MED ORDER — PANTOPRAZOLE SODIUM 40 MG PO TBEC: 40.0000 mg | DELAYED_RELEASE_TABLET | Freq: Every day | ORAL | Status: DC

## 2015-09-28 NOTE — Progress Notes (Signed)
Pre-visit discussion using our clinic review tool. No additional management support is needed unless otherwise documented below in the visit note.  

## 2015-09-28 NOTE — Progress Notes (Signed)
Patient ID: Kristine Hendricks, female   DOB: 11/16/1965, 50 y.o.   MRN: [id number]   Subjective:    Patient ID: Kristine Hendricks, female    DOB: December 06, 1965, 50 y.o.   MRN: [id number]  HPI  Patient here for a scheduled follow up.  She feels better.  Doing well.  Energy is better.  Still exercising.  No chest pain.  No sob.  No acid reflux.  No abdominal pain or cramping.  Bowels stable.  Rash is better.     Past Medical History  Diagnosis Date  . Hypertension   . Hypercholesterolemia   . Seasonal allergies   . Sleep apnea   . History of chicken pox   . Asthma   . GERD (gastroesophageal reflux disease)   . Anemia    Past Surgical History  Procedure Laterality Date  . Laparoscopic gastric banding  01/29/09  . Cholecystectomy  2007  . Arthroscopic surgery      Dr Sabra Heck  . Myomectomy  9/04  . Dilation and curettage of uterus  8/04  . Breast surgery  4 /05    biopsy  . Vaginal hysterectomy  06/11/03  . Breast cyst aspiration Right 2005   Family History  Problem Relation Age of Onset  . Lung cancer Mother   . Arthritis Mother   . Stroke Mother   . Hypertension Mother   . Colon polyps Mother   . Thyroid disease Sister     graves disease  . Colon cancer      maternal grandparent  . Breast cancer Neg Hx   . Hyperlipidemia Father   . Stroke Maternal Aunt   . Stroke Maternal Uncle   . Cancer Maternal Grandmother     colon  . Alcohol abuse Cousin    Social History   Social History  . Marital Status: Single    Spouse Name: N/A  . Number of Children: 0  . 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 Smoking status: Never Smoker   . Smokeless tobacco: Never Used  . Alcohol Use: 0.0 oz/week    0 Standard drinks or equivalent per week     Comment: occasional  . Drug Use: No  . Sexual Activity: Not Asked   Other Topics Concern  . None   Social History Narrative    Outpatient Encounter Prescriptions as of  09/28/2015  Medication Sig  . CALCIUM PO Take 300 mg by mouth 4 (four) times daily.   . chlorpheniramine-HYDROcodone (TUSSIONEX PENNKINETIC ER) 10-8 MG/5ML SUER Take 5 mLs by mouth every 12 (twelve) hours as needed.  Marland Kitchen losartan (COZAAR) 100 MG tablet Take 1 tablet (100 mg total) by mouth daily.  . Multiple Vitamin (MULTIVITAMIN PO) Take by mouth daily.    . NONFORMULARY OR COMPOUNDED [place] compound:   Combo Pain #1 - Aripiprazole 0.5%, Baclofen 3%, Gabapentin 8%, Ketorolac 8%, lLidocaine 5%, dispense 240 grams, apply 102 pumps to affected area 3-4 times a day, and rub 1-2 minutes, prn refills.  . ondansetron (ZOFRAN) 4 MG tablet Take 1 tablet (4 mg total) by mouth every 8 (eight) hours as needed for nausea or vomiting.  . pantoprazole (PROTONIX) 40 MG tablet Take 1 tablet (40 mg total) by mouth daily.  . predniSONE (DELTASONE) 50 MG tablet 1 tablet daily x 5 days.  Marland Kitchen venlafaxine XR (EFFEXOR-XR) 37.5 MG 24 hr capsule Take 1 capsule (37.5 mg total)  by mouth daily with breakfast.  . [DISCONTINUED] pantoprazole (PROTONIX) 40 MG tablet Take 1 tablet (40 mg total) by mouth 2 (two) times daily.   No facility-administered encounter medications on file as of 09/28/2015.    Review of Systems  Constitutional: Negative for appetite change and unexpected weight change.  HENT: Negative for congestion and sinus pressure.   Respiratory: Negative for cough, chest tightness and shortness of breath.   Cardiovascular: Negative for chest pain, palpitations and leg swelling.  Gastrointestinal: Negative for nausea, vomiting, abdominal pain and diarrhea.  Genitourinary: Negative for dysuria and difficulty urinating.  Musculoskeletal: Negative for back pain and joint swelling.  Skin: Negative for color change and rash.  Neurological: Negative for dizziness, light-headedness and headaches.  Psychiatric/Behavioral: Negative for dysphoric mood and agitation.       Objective:    Physical Exam    Constitutional: She appears well-developed and well-nourished. No distress.  HENT:  Nose: Nose normal.  Mouth/Throat: Oropharynx is clear and moist.  Neck: Neck supple. No thyromegaly present.  Cardiovascular: Normal rate and regular rhythm.   Pulmonary/Chest: Breath sounds normal. No respiratory distress. She has no wheezes.  Abdominal: Soft. Bowel sounds are normal. There is no tenderness.  Musculoskeletal: She exhibits no edema or tenderness.  Lymphadenopathy:    She has no cervical adenopathy.  Skin: No rash noted. No erythema.  Psychiatric: She has a normal mood and affect. Her behavior is normal.    BP 110/80 mmHg  Pulse 67  Temp(Src) 98.2 F (36.8 C) (Oral)  Resp 18  Ht 5\' 5"  (1.651 m)  Wt 299 lb 4 oz (135.739 kg)  BMI 49.80 kg/m2  SpO2 97%  LMP 06/11/2003 Wt Readings from Last 3 Encounters:  09/28/15 299 lb 4 oz (135.739 kg)  07/21/15 287 lb 4 oz (130.296 kg)  07/01/15 300 lb 4 oz (136.193 kg)     Lab Results  Component Value Date   WBC 5.4 07/16/2015   HGB 14.4 03/01/2014   HCT 41.6 07/16/2015   PLT 255 07/16/2015   GLUCOSE 85 07/16/2015   CHOL 234* 07/16/2015   TRIG 136 07/16/2015   HDL 52 07/16/2015   LDLCALC 155* 07/16/2015   ALT 16 04/27/2015   AST 20 04/27/2015   NA 142 07/16/2015   K 4.6 07/16/2015   CL 101 07/16/2015   CREATININE 0.78 07/16/2015   BUN 14 07/16/2015   CO2 26 07/16/2015   TSH 5.330* 07/16/2015   HGBA1C 5.3 03/19/2015    Dg Chest 2 View  07/21/2015  CLINICAL DATA:  Cough for 2 weeks EXAM: CHEST  2 VIEW COMPARISON:  12/08/2014 FINDINGS: The heart size and mediastinal contours are within normal limits. Both lungs are clear. The visualized skeletal structures are unremarkable. IMPRESSION: No active cardiopulmonary disease. Electronically Signed   By: Inez Catalina M.D.   On: 07/21/2015 15:48       Assessment & Plan:   Problem List Items Addressed This Visit    GERD (gastroesophageal reflux disease)    On protonix.  Upper  symptoms controlled.       Relevant Medications   pantoprazole (PROTONIX) 40 MG tablet   Health care maintenance - Primary   Hypercholesterolemia    Low cholesterol diet and exercise.  Follow lipid panel.        Hypertension    Blood pressure under good control.  Continue same medication regimen.  Follow pressures.  Follow metabolic panel.        Rash    Better  after prednisone.  Has not returned.  Saw dermatology.  Plans to return to dermatology if returns.        Severe obesity (BMI >= 40) (HCC)    Diet and exercise.  Follow.       Subacute cutaneous lupus erythematosus    Previous biopsy revealed subacute cutaneous lupus.  Saw rheumatology.  Felt no further w/up warranted.  Rash resolved.  F/u with dermatology.            Einar Pheasant, MD

## 2015-10-04 ENCOUNTER — Encounter: Payer: Self-pay | Admitting: Internal Medicine

## 2015-10-04 NOTE — Assessment & Plan Note (Signed)
Blood pressure under good control.  Continue same medication regimen.  Follow pressures.  Follow metabolic panel.   

## 2015-10-04 NOTE — Assessment & Plan Note (Signed)
Low cholesterol diet and exercise.  Follow lipid panel.   

## 2015-10-04 NOTE — Assessment & Plan Note (Signed)
Diet and exercise.  Follow.  

## 2015-10-04 NOTE — Assessment & Plan Note (Signed)
Previous biopsy revealed subacute cutaneous lupus.  Saw rheumatology.  Felt no further w/up warranted.  Rash resolved.  F/u with dermatology.

## 2015-10-04 NOTE — Assessment & Plan Note (Signed)
Better after prednisone.  Has not returned.  Saw dermatology.  Plans to return to dermatology if returns.

## 2015-10-04 NOTE — Assessment & Plan Note (Signed)
On protonix.  Upper symptoms controlled.  

## 2015-12-01 ENCOUNTER — Encounter: Payer: Self-pay | Admitting: Internal Medicine

## 2015-12-01 ENCOUNTER — Ambulatory Visit (INDEPENDENT_AMBULATORY_CARE_PROVIDER_SITE_OTHER): Payer: 59 | Admitting: Internal Medicine

## 2015-12-01 ENCOUNTER — Encounter (INDEPENDENT_AMBULATORY_CARE_PROVIDER_SITE_OTHER): Payer: Self-pay

## 2015-12-01 VITALS — BP 110/64 | HR 59 | Temp 98.3°F | Ht 65.0 in | Wt 297.2 lb

## 2015-12-01 DIAGNOSIS — I10 Essential (primary) hypertension: Secondary | ICD-10-CM

## 2015-12-01 DIAGNOSIS — Z23 Encounter for immunization: Secondary | ICD-10-CM | POA: Diagnosis not present

## 2015-12-01 DIAGNOSIS — L931 Subacute cutaneous lupus erythematosus: Secondary | ICD-10-CM | POA: Diagnosis not present

## 2015-12-01 DIAGNOSIS — E7800 Pure hypercholesterolemia, unspecified: Secondary | ICD-10-CM

## 2015-12-01 DIAGNOSIS — K2100 Gastro-esophageal reflux disease with esophagitis, without bleeding: Secondary | ICD-10-CM

## 2015-12-01 MED ORDER — ATORVASTATIN CALCIUM 10 MG PO TABS: 10.0000 mg | ORAL_TABLET | Freq: Every day | ORAL | 1 refills | Status: DC

## 2015-12-01 NOTE — Progress Notes (Signed)
Pre visit review using our clinic review tool, if applicable. No additional management support is needed unless otherwise documented below in the visit note. 

## 2015-12-01 NOTE — Progress Notes (Signed)
Patient ID: Kristine Hendricks, female   DOB: January 20, 1966, 50 y.o.   MRN: [id number]   Subjective:    Patient ID: Kristine Hendricks, female    DOB: 09-Jan-1966, 50 y.o.   MRN: [id number]  HPI  Patient here for a scheduled follow up.  She is doing well. Feels good.  Exercising.  Working with a Clinical research associate.  No chest pain.  No sob.  No acid reflux.  No abdominal pain or cramping.  Bowels stable.  Blood pressure averaging 120's/70-low 80s.  Rash has not reoccurred.     Past Medical History:  Diagnosis Date  . Anemia   . Asthma   . GERD (gastroesophageal reflux disease)   . History of chicken pox   . Hypercholesterolemia   . Hypertension   . Seasonal allergies   . Sleep apnea    Past Surgical History:  Procedure Laterality Date  . arthroscopic surgery     Dr Sabra Heck  . BREAST CYST ASPIRATION Right 2005  . BREAST SURGERY  4 /05   biopsy  . CHOLECYSTECTOMY  2007  . DILATION AND CURETTAGE OF UTERUS  8/04  . LAPAROSCOPIC GASTRIC BANDING  01/29/09  . MYOMECTOMY  9/04  . VAGINAL HYSTERECTOMY  06/11/03   Family History  Problem Relation Age of Onset  . Lung cancer Mother   . Arthritis Mother   . Stroke Mother   . Hypertension Mother   . Colon polyps Mother   . Thyroid disease Sister     graves disease  . Colon cancer      maternal grandparent  . Hyperlipidemia Father   . Stroke Maternal Aunt   . Stroke Maternal Uncle   . Cancer Maternal Grandmother     colon  . Alcohol abuse Cousin   . Breast cancer Neg Hx    Social History   Social History  . Marital status: Single    Spouse name: N/A  . Number of children: 0  . 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 Smoking status: Never Smoker  . Smokeless tobacco: Never Used  . Alcohol use 0.0 oz/week     Comment: occasional  . Drug use: No  . Sexual activity: Not Asked   Other Topics Concern  . None   Social History Narrative  . None    Outpatient Encounter  Prescriptions as of 12/01/2015  Medication Sig  . CALCIUM PO Take 300 mg by mouth 4 (four) times daily.   . chlorpheniramine-HYDROcodone (TUSSIONEX PENNKINETIC ER) 10-8 MG/5ML SUER Take 5 mLs by mouth every 12 (twelve) hours as needed.  Marland Kitchen KRILL OIL PO Take by mouth.  . losartan (COZAAR) 100 MG tablet Take 1 tablet (100 mg total) by mouth daily.  . Multiple Vitamin (MULTIVITAMIN PO) Take by mouth daily.    . NONFORMULARY OR COMPOUNDED [place] compound:   Combo Pain #1 - Aripiprazole 0.5%, Baclofen 3%, Gabapentin 8%, Ketorolac 8%, lLidocaine 5%, dispense 240 grams, apply 102 pumps to affected area 3-4 times a day, and rub 1-2 minutes, prn refills.  . pantoprazole (PROTONIX) 40 MG tablet Take 1 tablet (40 mg total) by mouth daily.  Marland Kitchen venlafaxine XR (EFFEXOR-XR) 37.5 MG 24 hr capsule Take 1 capsule (37.5 mg total) by mouth daily with breakfast.  . [DISCONTINUED] ondansetron (ZOFRAN) 4 MG tablet Take 1 tablet (4 mg total) by mouth every 8 (eight) hours as needed for nausea or vomiting.  . [  DISCONTINUED] predniSONE (DELTASONE) 50 MG tablet 1 tablet daily x 5 days.  Marland Kitchen atorvastatin (LIPITOR) 10 MG tablet Take 1 tablet (10 mg total) by mouth daily.   No facility-administered encounter medications on file as of 12/01/2015.     Review of Systems  Constitutional: Negative for appetite change and unexpected weight change.  HENT: Negative for congestion and sinus pressure.   Respiratory: Negative for cough, chest tightness and shortness of breath.   Cardiovascular: Negative for chest pain, palpitations and leg swelling.  Gastrointestinal: Negative for abdominal pain, diarrhea, nausea and vomiting.  Genitourinary: Negative for difficulty urinating and dysuria.  Musculoskeletal: Negative for back pain and joint swelling.  Skin: Negative for color change and rash.  Neurological: Negative for dizziness, light-headedness and headaches.  Psychiatric/Behavioral: Negative for agitation and  dysphoric mood.       Objective:    Physical Exam  Constitutional: She appears well-developed and well-nourished. No distress.  HENT:  Nose: Nose normal.  Mouth/Throat: Oropharynx is clear and moist.  Neck: Neck supple. No thyromegaly present.  Cardiovascular: Normal rate and regular rhythm.   Pulmonary/Chest: Breath sounds normal. No respiratory distress. She has no wheezes.  Abdominal: Soft. Bowel sounds are normal. There is no tenderness.  Musculoskeletal: She exhibits no edema or tenderness.  Lymphadenopathy:    She has no cervical adenopathy.  Skin: No rash noted. No erythema.  Psychiatric: She has a normal mood and affect. Her behavior is normal.    BP 110/64   Pulse (!) 59   Temp 98.3 F (36.8 C) (Oral)   Ht 5\' 5"  (1.651 m)   Wt 297 lb 3.2 oz (134.8 kg)   LMP 06/11/2003   SpO2 97%   BMI 49.46 kg/m  Wt Readings from Last 3 Encounters:  12/01/15 297 lb 3.2 oz (134.8 kg)  09/28/15 299 lb 4 oz (135.7 kg)  07/21/15 287 lb 4 oz (130.3 kg)     Lab Results  Component Value Date   WBC 5.4 07/16/2015   HGB 14.4 03/01/2014   HCT 41.6 07/16/2015   PLT 255 07/16/2015   GLUCOSE 85 07/16/2015   CHOL 234 (H) 07/16/2015   TRIG 136 07/16/2015   HDL 52 07/16/2015   LDLCALC 155 (H) 07/16/2015   ALT 16 04/27/2015   AST 20 04/27/2015   NA 142 07/16/2015   K 4.6 07/16/2015   CL 101 07/16/2015   CREATININE 0.78 07/16/2015   BUN 14 07/16/2015   CO2 26 07/16/2015   TSH 5.330 (H) 07/16/2015   HGBA1C 5.3 03/19/2015    Dg Chest 2 View  Result Date: 07/21/2015 CLINICAL DATA:  Cough for 2 weeks EXAM: CHEST  2 VIEW COMPARISON:  12/08/2014 FINDINGS: The heart size and mediastinal contours are within normal limits. Both lungs are clear. The visualized skeletal structures are unremarkable. IMPRESSION: No active cardiopulmonary disease. Electronically Signed   By: Inez Catalina M.D.   On: 07/21/2015 15:48       Assessment & Plan:   Problem List Items Addressed This Visit     GERD (gastroesophageal reflux disease)    On protonix.  No acid reflux.   Follow.        Hypercholesterolemia - Primary    Low cholesterol diet and exercise.  Follow lipid panel and liver function tests.  On lipitor.        Relevant Medications   atorvastatin (LIPITOR) 10 MG tablet   Other Relevant Orders   Hepatic function panel   Hypertension    Blood  pressure under good control.  Continue same medication regimen.  Follow pressures.  Follow metabolic panel.        Relevant Medications   atorvastatin (LIPITOR) 10 MG tablet   Severe obesity (BMI >= 40) (HCC)    Discussed diet and exercise.  Exercising 5 days per week.  Using my fitness APP.  Watching her diet.  Form completed for work.        Subacute cutaneous lupus erythematosus    Rash has not returned.  Saw dermatology.  Follow.         Other Visit Diagnoses    Encounter for immunization       Relevant Medications   KRILL OIL PO   atorvastatin (LIPITOR) 10 MG tablet   Other Relevant Orders   Flu Vaccine QUAD 36+ mos IM (Completed)       Einar Pheasant, MD

## 2015-12-05 ENCOUNTER — Encounter: Payer: Self-pay | Admitting: Internal Medicine

## 2015-12-05 NOTE — Assessment & Plan Note (Signed)
Rash has not returned.  Saw dermatology.  Follow.

## 2015-12-05 NOTE — Assessment & Plan Note (Signed)
Discussed diet and exercise.  Exercising 5 days per week.  Using my fitness APP.  Watching her diet.  Form completed for work.

## 2015-12-05 NOTE — Assessment & Plan Note (Signed)
Blood pressure under good control.  Continue same medication regimen.  Follow pressures.  Follow metabolic panel.   

## 2015-12-05 NOTE — Assessment & Plan Note (Signed)
Low cholesterol diet and exercise.  Follow lipid panel and liver function tests.  On lipitor.   

## 2015-12-05 NOTE — Assessment & Plan Note (Signed)
On protonix.  No acid reflux.  Follow.   

## 2016-01-20 ENCOUNTER — Telehealth: Payer: Self-pay | Admitting: Internal Medicine

## 2016-01-20 DIAGNOSIS — E7800 Pure hypercholesterolemia, unspecified: Secondary | ICD-10-CM

## 2016-01-20 DIAGNOSIS — I10 Essential (primary) hypertension: Secondary | ICD-10-CM

## 2016-01-20 NOTE — Telephone Encounter (Signed)
Orders placed for labs

## 2016-01-20 NOTE — Telephone Encounter (Signed)
Please advise 

## 2016-01-20 NOTE — Telephone Encounter (Signed)
Pt called and wanted to know if we could add a TSH, T3, T4 to her order. She will be going to [facility]. [phone]  Call @ [phone]

## 2016-01-20 NOTE — Telephone Encounter (Signed)
I have placed order for labs.

## 2016-01-20 NOTE — Telephone Encounter (Signed)
Patient notified

## 2016-01-22 LAB — HEPATIC FUNCTION PANEL
ALK PHOS: 90 IU/L (ref 39–117)
ALT: 13 IU/L (ref 0–32)
AST: 18 IU/L (ref 0–40)
Albumin: 4 g/dL (ref 3.5–5.5)
BILIRUBIN, DIRECT: 0.1 mg/dL (ref 0.00–0.40)
Bilirubin Total: 0.5 mg/dL (ref 0.0–1.2)
Total Protein: 6.5 g/dL (ref 6.0–8.5)

## 2016-01-22 LAB — T3, FREE: T3, Free: 2.8 pg/mL (ref 2.0–4.4)

## 2016-01-22 LAB — BASIC METABOLIC PANEL
BUN / CREAT RATIO: 20 (ref 9–23)
BUN: 16 mg/dL (ref 6–24)
CHLORIDE: 101 mmol/L (ref 96–106)
CO2: 26 mmol/L (ref 18–29)
Calcium: 9.7 mg/dL (ref 8.7–10.2)
Creatinine, Ser: 0.81 mg/dL (ref 0.57–1.00)
GFR calc non Af Amer: 84 mL/min/{1.73_m2} (ref >59)
GFR, EST AFRICAN AMERICAN: 97 mL/min/{1.73_m2} (ref >59)
Glucose: 82 mg/dL (ref 65–99)
POTASSIUM: 4.6 mmol/L (ref 3.5–5.2)
Sodium: 142 mmol/L (ref 134–144)

## 2016-01-22 LAB — LIPID PANEL
CHOL/HDL RATIO: 3.1 ratio (ref 0.0–4.4)
Cholesterol, Total: 163 mg/dL (ref 100–199)
HDL: 52 mg/dL (ref >39)
LDL CALC: 92 mg/dL (ref 0–99)
TRIGLYCERIDES: 97 mg/dL (ref 0–149)
VLDL Cholesterol Cal: 19 mg/dL (ref 5–40)

## 2016-01-22 LAB — T4, FREE: Free T4: 1.13 ng/dL (ref 0.82–1.77)

## 2016-01-22 LAB — TSH: TSH: 3.63 u[IU]/mL (ref 0.450–4.500)

## 2016-01-27 ENCOUNTER — Emergency Department
Admission: EM | Admit: 2016-01-27 | Discharge: 2016-01-27 | Disposition: A | Discharge status: Home / Self Care | Payer: No Typology Code available for payment source | Attending: Emergency Medicine | Admitting: Emergency Medicine

## 2016-01-27 ENCOUNTER — Encounter: Payer: Self-pay | Admitting: Medical Oncology

## 2016-01-27 ENCOUNTER — Emergency Department: Payer: No Typology Code available for payment source

## 2016-01-27 DIAGNOSIS — S40022A Contusion of left upper arm, initial encounter: Secondary | ICD-10-CM

## 2016-01-27 DIAGNOSIS — Z79899 Other long term (current) drug therapy: Secondary | ICD-10-CM | POA: Insufficient documentation

## 2016-01-27 DIAGNOSIS — Y999 Unspecified external cause status: Secondary | ICD-10-CM | POA: Insufficient documentation

## 2016-01-27 DIAGNOSIS — S20212A Contusion of left front wall of thorax, initial encounter: Secondary | ICD-10-CM | POA: Diagnosis not present

## 2016-01-27 DIAGNOSIS — I10 Essential (primary) hypertension: Secondary | ICD-10-CM | POA: Insufficient documentation

## 2016-01-27 DIAGNOSIS — Y92410 Unspecified street and highway as the place of occurrence of the external cause: Secondary | ICD-10-CM | POA: Insufficient documentation

## 2016-01-27 DIAGNOSIS — Y9389 Activity, other specified: Secondary | ICD-10-CM | POA: Diagnosis not present

## 2016-01-27 DIAGNOSIS — J45909 Unspecified asthma, uncomplicated: Secondary | ICD-10-CM | POA: Diagnosis not present

## 2016-01-27 DIAGNOSIS — S199XXA Unspecified injury of neck, initial encounter: Secondary | ICD-10-CM | POA: Diagnosis present

## 2016-01-27 DIAGNOSIS — S161XXA Strain of muscle, fascia and tendon at neck level, initial encounter: Secondary | ICD-10-CM

## 2016-01-27 DIAGNOSIS — R109 Unspecified abdominal pain: Secondary | ICD-10-CM | POA: Diagnosis not present

## 2016-01-27 MED ORDER — BACLOFEN 10 MG PO TABS: 10.0000 mg | ORAL_TABLET | Freq: Three times a day (TID) | ORAL | 0 refills | Status: AC | PRN

## 2016-01-27 MED ORDER — NAPROXEN 500 MG PO TABS: 500.0000 mg | ORAL_TABLET | Freq: Two times a day (BID) | ORAL | 0 refills | Status: DC
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
## 2016-01-28 ENCOUNTER — Encounter (HOSPITAL_COMMUNITY): Payer: Self-pay

## 2016-01-28 ENCOUNTER — Other Ambulatory Visit: Payer: Self-pay | Admitting: Internal Medicine

## 2016-01-31 ENCOUNTER — Encounter: Payer: Self-pay | Admitting: Internal Medicine

## 2016-02-01 ENCOUNTER — Other Ambulatory Visit: Payer: Self-pay

## 2016-02-01 MED ORDER — ATORVASTATIN CALCIUM 10 MG PO TABS: 10.0000 mg | ORAL_TABLET | Freq: Every day | ORAL | 1 refills | Status: DC

## 2016-02-02 ENCOUNTER — Ambulatory Visit (INDEPENDENT_AMBULATORY_CARE_PROVIDER_SITE_OTHER): Payer: 59 | Admitting: Family

## 2016-02-02 ENCOUNTER — Encounter: Payer: Self-pay | Admitting: Family

## 2016-02-02 VITALS — BP 144/88 | HR 59 | Temp 98.3°F | Ht 64.0 in | Wt 298.8 lb

## 2016-02-02 DIAGNOSIS — M79602 Pain in left arm: Secondary | ICD-10-CM | POA: Diagnosis not present

## 2016-02-02 NOTE — Progress Notes (Signed)
Pre visit review using our clinic review tool, if applicable. No additional management support is needed unless otherwise documented below in the visit note. 

## 2016-02-02 NOTE — Patient Instructions (Signed)
Suspect biceps tendinopathy   Continue conservative management and follow-up in one to 2 days if not improving.  Ice and elevation.   No upper body heavy lifting or working out yet :)   If there is no improvement in your symptoms, or if there is any worsening of symptoms, or if you have any additional concerns, please return for re-evaluation; or, if we are closed, consider going to the Emergency Room for evaluation if symptoms urgent.   Biceps Tendon Tendinitis (Distal) Distal biceps tendon tendinitis is inflammation of the distal biceps tendon. The distal biceps tendon is a strong cord of tissue that connects the biceps muscle, on the front of the upper arm, to a bone (radius) in the elbow. Distal biceps tendon tendinitis can interfere with the ability to bend the elbow and turn the hand palm-up (supination). This condition is usually caused by overusing the elbow joint and the biceps muscle, and it usually heals within 6 weeks. Distal biceps tendon tendinitis may include a grade 1 or grade 2 strain of the tendon. A grade 1 strain is mild, and it involves a slight pull of the tendon without any stretching or noticeable tearing of the tendon. There is usually no loss of biceps muscle strength. A grade 2 strain is moderate, and it involves a small tear in the tendon. The tendon is stretched, and biceps muscle strength is usually decreased. What are the causes? This condition may be caused by:  A sudden increase in the frequency or intensity of activity that involves the elbow and the biceps muscle.  Overuse of the biceps muscle. This can happen when you do the same movements over and over, such as:  Supination.  Forceful straightening (hyperextension) of the elbow.  Bending of the elbow.  A direct, forceful hit or injury (trauma) to the elbow. This is rare. What increases the risk? The following factors may make you more likely to develop this condition:  Playing contact  sports.  Playing sports that involve throwing and overhead movements, including racket sports, gymnastics, weight lifting, or bodybuilding.  Doing physical labor.  Having poor strength and flexibility of the arm and shoulder.  Having injured other parts of the elbow. What are the signs or symptoms? Symptoms of this condition may include:  Pain and inflammation in the front of the elbow. Pain may get worse during certain movements, such as:  Supination.  Bending the elbow.  Lifting or carrying objects.  Throwing.  A feeling of warmth in the front of the elbow.  A crackling sound (crepitation) when you move or touch the elbow or the upper arm. In some cases, symptoms may return (recur) after treatment, and they may be long-lasting (chronic). How is this diagnosed? This condition is diagnosed based on your symptoms, your medical history, and a physical exam. You may have tests, including X-rays or MRIs. Your health care provider may test your range of motion by having you do arm movements. How is this treated? This condition is treated by resting and icing the injured area, and by doing physical therapy exercises. Depending on the severity of your condition, treatment may also include:  Medicines to help relieve pain and inflammation.  Ultrasound therapy. This is the application of sound waves to the injured area. Follow these instructions at home: Managing pain, stiffness, and swelling  If directed, put ice on the injured area:  Put ice in a plastic bag.  Place a towel between your skin and the bag.  Leave the  ice on for 20 minutes, 2-3 times a day.  Move your fingers often to avoid stiffness and to lessen swelling.  Raise (elevate) the injured area above the level of your heart while you are sitting or lying down.  If directed, apply heat to the affected area before you exercise. Use the heat source that your health care provider recommends, such as a moist heat pack  or a heating pad.  Place a towel between your skin and the heat source.  Leave the heat on for 20-30 minutes.  Remove the heat if your skin turns bright red. This is especially important if you are unable to feel pain, heat, or cold. You may have a greater risk of getting burned. Activity  Return to your normal activities as told by your health care provider. Ask your health care provider what activities are safe for you.  Do not lift anything that is heavier than 10 lb (4.5 kg) until your health care provider tells you that it is safe.  Avoid activities that cause pain or make your condition worse.  Do exercises as told by your health care provider. General instructions  Take over-the-counter and prescription medicines only as told by your health care provider.  Do not drive or operate heavy machinery while taking prescription pain medicines.  Keep all follow-up visits as told by your health care provider. This is important. How is this prevented?  Warm up and stretch before being active.  Cool down and stretch after being active.  Give your body time to rest between periods of activity.  Make sure to use equipment that fits you.  Be safe and responsible while being active to avoid falls.  Do at least 150 minutes of moderate-intensity exercise each week, such as brisk walking or water aerobics.  Maintain physical fitness, including:  Strength.  Flexibility.  Cardiovascular fitness.  Endurance. Contact a health care provider if:  You have symptoms that get worse or do not get better after 2 weeks of treatment.  You develop new symptoms. Get help right away if:  You develop severe pain. This information is not intended to replace advice given to you by your health care provider. Make sure you discuss any questions you have with your health care provider. Document Released: 03/06/2005 Document Revised: 11/11/2015 Document Reviewed: 02/12/2015 Elsevier Interactive  Patient Education  2017 [employer].
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
## 2016-02-03 ENCOUNTER — Telehealth: Payer: Self-pay | Admitting: *Deleted

## 2016-02-03 DIAGNOSIS — M7989 Other specified soft tissue disorders: Secondary | ICD-10-CM

## 2016-02-03 NOTE — Telephone Encounter (Signed)
Call pt  So sorry to hear .. I went ahead and placed referral to ortho for further eval as conservative therapy is not getting [place] the results.   Limit use of left arm to not aggravate, ice, and compression.  Please ensure no SOB.

## 2016-02-03 NOTE — Telephone Encounter (Signed)
Pt was seen in the office on 11/15 by Mable Paris with left arm swelling. She wanted to report that the swelling has not gone down. She was advised to call the office if swelling continued.  Pt contact [phone]

## 2016-02-03 NOTE — Telephone Encounter (Signed)
FYI

## 2016-02-04 NOTE — Telephone Encounter (Signed)
Patient has been informed. No SOB. Also patients arm is still swollen.  No changes to the arm.

## 2016-02-08 ENCOUNTER — Encounter: Payer: Self-pay | Admitting: Family

## 2016-02-09 ENCOUNTER — Encounter: Payer: Self-pay | Admitting: *Deleted

## 2016-02-14 ENCOUNTER — Encounter
Admission: RE | Disposition: A | Discharge status: Home / Self Care | Payer: Self-pay | Source: Ambulatory Visit | Attending: Unknown Physician Specialty

## 2016-02-14 ENCOUNTER — Ambulatory Visit: Payer: 59 | Admitting: Anesthesiology

## 2016-02-14 ENCOUNTER — Ambulatory Visit
Admission: RE | Admit: 2016-02-14 | Discharge: 2016-02-14 | Disposition: A | Discharge status: Home / Self Care | Payer: 59 | Source: Ambulatory Visit | Attending: Unknown Physician Specialty | Admitting: Unknown Physician Specialty

## 2016-02-14 ENCOUNTER — Encounter: Payer: Self-pay | Admitting: *Deleted

## 2016-02-14 DIAGNOSIS — I10 Essential (primary) hypertension: Secondary | ICD-10-CM | POA: Diagnosis not present

## 2016-02-14 DIAGNOSIS — K219 Gastro-esophageal reflux disease without esophagitis: Secondary | ICD-10-CM | POA: Insufficient documentation

## 2016-02-14 DIAGNOSIS — K621 Rectal polyp: Secondary | ICD-10-CM | POA: Insufficient documentation

## 2016-02-14 DIAGNOSIS — I739 Peripheral vascular disease, unspecified: Secondary | ICD-10-CM | POA: Insufficient documentation

## 2016-02-14 DIAGNOSIS — G4730 Sleep apnea, unspecified: Secondary | ICD-10-CM | POA: Diagnosis not present

## 2016-02-14 DIAGNOSIS — Z1211 Encounter for screening for malignant neoplasm of colon: Secondary | ICD-10-CM | POA: Diagnosis present

## 2016-02-14 DIAGNOSIS — Z8371 Family history of colonic polyps: Secondary | ICD-10-CM | POA: Insufficient documentation

## 2016-02-14 DIAGNOSIS — Z79899 Other long term (current) drug therapy: Secondary | ICD-10-CM | POA: Diagnosis not present

## 2016-02-14 DIAGNOSIS — Z9884 Bariatric surgery status: Secondary | ICD-10-CM | POA: Insufficient documentation

## 2016-02-14 DIAGNOSIS — Z6841 Body Mass Index (BMI) 40.0 and over, adult: Secondary | ICD-10-CM | POA: Diagnosis not present

## 2016-02-14 DIAGNOSIS — K5730 Diverticulosis of large intestine without perforation or abscess without bleeding: Secondary | ICD-10-CM | POA: Insufficient documentation

## 2016-02-14 DIAGNOSIS — K640 First degree hemorrhoids: Secondary | ICD-10-CM | POA: Diagnosis not present

## 2016-02-14 DIAGNOSIS — E7800 Pure hypercholesterolemia, unspecified: Secondary | ICD-10-CM | POA: Diagnosis not present

## 2016-02-14 HISTORY — PX: COLONOSCOPY WITH PROPOFOL: SHX5780

## 2016-02-14 LAB — HM COLONOSCOPY

## 2016-02-14 SURGERY — COLONOSCOPY WITH PROPOFOL
Anesthesia: General

## 2016-02-14 MED ORDER — FENTANYL CITRATE (PF) 100 MCG/2ML IJ SOLN
INTRAMUSCULAR | Status: DC | PRN
  Administered 2016-02-14: 50 ug via INTRAVENOUS

## 2016-02-14 MED ORDER — MIDAZOLAM HCL 5 MG/5ML IJ SOLN
INTRAMUSCULAR | Status: DC | PRN
  Administered 2016-02-14: 1 mg via INTRAVENOUS

## 2016-02-14 MED ORDER — PROPOFOL 10 MG/ML IV BOLUS
INTRAVENOUS | Status: DC | PRN
  Administered 2016-02-14: 100 mg via INTRAVENOUS

## 2016-02-14 MED ORDER — PROPOFOL 500 MG/50ML IV EMUL
INTRAVENOUS | Status: DC | PRN
  Administered 2016-02-14: 160 ug/kg/min via INTRAVENOUS

## 2016-02-14 MED ORDER — LIDOCAINE 2% (20 MG/ML) 5 ML SYRINGE
INTRAMUSCULAR | Status: DC | PRN
  Administered 2016-02-14: 40 mg via INTRAVENOUS

## 2016-02-14 MED ORDER — SODIUM CHLORIDE 0.9 % IV SOLN: INTRAVENOUS | Status: DC

## 2016-02-14 MED ORDER — SODIUM CHLORIDE 0.9 % IV SOLN
INTRAVENOUS | Status: DC
  Administered 2016-02-14 (×2): via INTRAVENOUS
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
## 2016-02-14 NOTE — Anesthesia Postprocedure Evaluation (Signed)
Anesthesia Post Note  Patient: Kristine Hendricks  Procedure(s) Performed: Procedure(s) (LRB): COLONOSCOPY WITH PROPOFOL (N/A)  Patient location during evaluation: PACU Anesthesia Type: General Level of consciousness: awake and alert and oriented Pain management: pain level controlled Vital Signs Assessment: post-procedure vital signs reviewed and stable Respiratory status: spontaneous breathing Cardiovascular status: blood pressure returned to baseline Anesthetic complications: no    Last Vitals:  Vitals:   02/14/16 1619 02/14/16 1629  BP: (!) 157/92 (!) 152/88  Pulse: (!) 54 (!) 55  Resp: 12 15  Temp:      Last Pain:  Vitals:   02/14/16 1559  TempSrc: Tympanic  PainSc:                  Hephzibah Strehle

## 2016-02-14 NOTE — H&P (Signed)
Primary Care Physician:  Einar Pheasant, MD Primary Gastroenterologist:  Dr. Vira Agar  Pre-Procedure History & Physical: HPI:  Mineola Neiger is a 50 y.o. female is here for an colonoscopy.   Past Medical History:  Diagnosis Date  . Anemia   . Asthma   . GERD (gastroesophageal reflux disease)   . History of chicken pox   . Hypercholesterolemia   . Hypertension   . Seasonal allergies   . Sleep apnea     Past Surgical History:  Procedure Laterality Date  . arthroscopic surgery     Dr Sabra Heck  . BREAST CYST ASPIRATION Right 2005  . BREAST SURGERY  4 /05   biopsy  . CHOLECYSTECTOMY  2007  . DILATION AND CURETTAGE OF UTERUS  8/04  . LAPAROSCOPIC GASTRIC BANDING  01/29/09  . MYOMECTOMY  9/04  . VAGINAL HYSTERECTOMY  06/11/03    Prior to Admission medications   Medication Sig Start Date End Date Taking? Authorizing Provider  atorvastatin (LIPITOR) 10 MG tablet Take 1 tablet (10 mg total) by mouth daily. 02/01/16  Yes Einar Pheasant, MD  CALCIUM PO Take 300 mg by mouth 4 (four) times daily.    Yes Historical Provider, MD  losartan (COZAAR) 100 MG tablet Take 1 tablet (100 mg total) by mouth daily. 03/25/15  Yes Einar Pheasant, MD  Multiple Vitamin (MULTIVITAMIN PO) Take by mouth daily.     Yes Historical Provider, MD  pantoprazole (PROTONIX) 40 MG tablet Take 1 tablet (40 mg total) by mouth daily. 09/28/15  Yes Einar Pheasant, MD  venlafaxine XR (EFFEXOR-XR) 37.5 MG 24 hr capsule Take 1 capsule (37.5 mg total) by mouth daily with breakfast. 03/25/15  Yes Einar Pheasant, MD  naproxen (NAPROSYN) 500 MG tablet Take 1 tablet (500 mg total) by mouth 2 (two) times daily with a meal. Patient not taking: Reported on 02/14/2016 01/27/16   Jami L Hagler, PA-C    Allergies as of 02/03/2016 - Review Complete 02/02/2016  Allergen Reaction Noted  . Sulfa antibiotics Rash 11/25/2010  . Codeine Nausea And Vomiting 11/25/2010    Family History  Problem Relation Age of Onset  . Lung cancer  Mother   . Arthritis Mother   . Stroke Mother   . Hypertension Mother   . Colon polyps Mother   . Thyroid disease Sister     graves disease  . Colon cancer      maternal grandparent  . Hyperlipidemia Father   . Stroke Maternal Aunt   . Stroke Maternal Uncle   . Cancer Maternal Grandmother     colon  . Alcohol abuse Cousin   . Breast cancer Neg Hx     Social History   Social History  . Marital status: Single    Spouse name: N/A  . Number of children: 0  . 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 Smoking status: Never Smoker  . Smokeless tobacco: Never Used  . Alcohol use No     Comment: occasional  . Drug use: No  . Sexual activity: Not on file   Other Topics Concern  . Not on file   Social History Narrative  . No narrative on file    Review of Systems: See HPI, otherwise negative ROS  Physical Exam: BP 118/78   Pulse 62   Temp (!) 96.9 F (36.1 C) (Tympanic)   Resp 18   Ht 5\' 4"  (1.626 m)  Wt 131.5 kg (290 lb)   LMP 06/11/2003   SpO2 98%   BMI 49.78 kg/m  General:   Alert,  pleasant and cooperative in NAD Head:  Normocephalic and atraumatic. Neck:  Supple; no masses or thyromegaly. Lungs:  Clear throughout to auscultation.    Heart:  Regular rate and rhythm. Abdomen:  Soft, nontender and nondistended. Normal bowel sounds, without guarding, and without rebound.   Neurologic:  Alert and  oriented x4;  grossly normal neurologically.  Impression/Plan: Genelle Toller is here for an colonoscopy to be performed for screening and family history of colon polyps  Risks, benefits, limitations, and alternatives regarding  colonoscopy have been reviewed with the patient.  Questions have been answered.  All parties agreeable.   Gaylyn Cheers, MD  02/14/2016, 3:29 PM

## 2016-02-14 NOTE — Transfer of Care (Signed)
Immediate Anesthesia Transfer of Care Note  Patient: Kristine Hendricks  Procedure(s) Performed: Procedure(s): COLONOSCOPY WITH PROPOFOL (N/A)  Patient Location: PACU and Endoscopy Unit  Anesthesia Type:General  Level of Consciousness: awake, oriented and patient cooperative  Airway & Oxygen Therapy: Patient Spontanous Breathing and Patient connected to nasal cannula oxygen  Post-op Assessment: Report given to RN and Post -op Vital signs reviewed and stable  Post vital signs: Reviewed and stable  Last Vitals:  Vitals:   02/14/16 1428 02/14/16 1559  BP: 118/78 132/62  Pulse: 62 68  Resp: 18 12  Temp: (!) 36.1 C 36.2 C    Last Pain:  Vitals:   02/14/16 1559  TempSrc: Tympanic  PainSc:       Patients Stated Pain Goal: 7 ([id number])  Complications: No apparent anesthesia complications

## 2016-02-14 NOTE — Op Note (Signed)
[facility] Gastroenterology Patient Name: Kristine Hendricks Procedure Date: 02/14/2016 3:31 PM MRN: [id number] Account #: [id number] Date of Birth: 1965-06-23 Admit Type: Outpatient Age: 50 Room: [facility] ENDO ROOM 4 Gender: Female Note Status: Finalized Procedure:            Colonoscopy Indications:          Colon cancer screening in patient at increased risk:                        Family history of 1st-degree relative with colon polyps Providers:            Manya Silvas, MD Referring MD:         Einar Pheasant, MD (Referring MD) Medicines:            Propofol per Anesthesia Complications:        No immediate complications. Procedure:            Pre-Anesthesia Assessment:                       - After reviewing the risks and benefits, the patient                        was deemed in satisfactory condition to undergo the                        procedure.                       After obtaining informed consent, the colonoscope was                        passed under direct vision. Throughout the procedure,                        the patient's blood pressure, pulse, and oxygen                        saturations were monitored continuously. The                        Colonoscope was introduced through the anus and                        advanced to the the cecum, identified by appendiceal                        orifice and ileocecal valve. The colonoscopy was                        performed without difficulty. The patient tolerated the                        procedure well. The quality of the bowel preparation                        was good. Findings:      Colon somewhat tortuous.      Two sessile polyps were found in the rectum. The polyps were diminutive       in size. These polyps were removed with a jumbo cold forceps. Resection  and retrieval were complete.      Internal hemorrhoids were found during endoscopy. The hemorrhoids were       small and Grade  I (internal hemorrhoids that do not prolapse).      The exam was otherwise without abnormality.      A single large-mouthed diverticulum was found in the sigmoid colon.       There was no evidence of diverticular bleeding. Impression:           - Two diminutive polyps in the rectum, removed with a                        jumbo cold forceps. Resected and retrieved.                       - Internal hemorrhoids.                       - The examination was otherwise normal. Recommendation:       - Await pathology results. Manya Silvas, MD 02/14/2016 3:58:26 PM This report has been signed electronically. Number of Addenda: 0 Note Initiated On: 02/14/2016 3:31 PM Scope Withdrawal Time: 0 hours 10 minutes 53 seconds  Total Procedure Duration: 0 hours 18 minutes 42 seconds       [facility]

## 2016-02-15 ENCOUNTER — Encounter: Payer: Self-pay | Admitting: Unknown Physician Specialty

## 2016-02-16 LAB — SURGICAL PATHOLOGY

## 2016-02-17 ENCOUNTER — Encounter: Payer: Self-pay | Admitting: Internal Medicine

## 2016-02-17 DIAGNOSIS — Z86010 Personal history of colonic polyps: Secondary | ICD-10-CM | POA: Insufficient documentation

## 2016-02-28 ENCOUNTER — Encounter: Payer: Self-pay | Admitting: Internal Medicine

## 2016-03-31 ENCOUNTER — Ambulatory Visit (INDEPENDENT_AMBULATORY_CARE_PROVIDER_SITE_OTHER): Payer: 59 | Admitting: Internal Medicine

## 2016-03-31 ENCOUNTER — Encounter: Payer: Self-pay | Admitting: Internal Medicine

## 2016-03-31 VITALS — BP 128/84 | HR 61 | Temp 98.2°F | Ht 64.0 in | Wt 297.2 lb

## 2016-03-31 DIAGNOSIS — E7800 Pure hypercholesterolemia, unspecified: Secondary | ICD-10-CM

## 2016-03-31 DIAGNOSIS — Z1231 Encounter for screening mammogram for malignant neoplasm of breast: Secondary | ICD-10-CM | POA: Diagnosis not present

## 2016-03-31 DIAGNOSIS — Z0000 Encounter for general adult medical examination without abnormal findings: Secondary | ICD-10-CM | POA: Diagnosis not present

## 2016-03-31 DIAGNOSIS — Z860100 Personal history of colon polyps, unspecified: Secondary | ICD-10-CM

## 2016-03-31 DIAGNOSIS — K2100 Gastro-esophageal reflux disease with esophagitis, without bleeding: Secondary | ICD-10-CM

## 2016-03-31 DIAGNOSIS — E66813 Obesity, class 3: Secondary | ICD-10-CM

## 2016-03-31 DIAGNOSIS — L931 Subacute cutaneous lupus erythematosus: Secondary | ICD-10-CM | POA: Diagnosis not present

## 2016-03-31 DIAGNOSIS — I10 Essential (primary) hypertension: Secondary | ICD-10-CM

## 2016-03-31 NOTE — Assessment & Plan Note (Signed)
Colonoscopy 02/14/16 as outlined.  Recommended f/u colonoscopy in 5 years.

## 2016-03-31 NOTE — Progress Notes (Signed)
Pre visit review using our clinic review tool, if applicable. No additional management support is needed unless otherwise documented below in the visit note. 
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
## 2016-03-31 NOTE — Assessment & Plan Note (Signed)
Physical today 03/31/16.  S/p hysterectomy.  Mammogram 04/01/15 - Birads I.  Scheduled for f/u mammogram.

## 2016-04-02 ENCOUNTER — Other Ambulatory Visit: Payer: Self-pay | Admitting: Internal Medicine

## 2016-04-02 ENCOUNTER — Encounter: Payer: Self-pay | Admitting: Internal Medicine

## 2016-04-02 NOTE — Assessment & Plan Note (Signed)
Blood pressure under good control.  Continue same medication regimen.  Follow pressures.  Follow metabolic panel.   

## 2016-04-02 NOTE — Assessment & Plan Note (Signed)
Diet and exercise.  Follow.  

## 2016-04-02 NOTE — Assessment & Plan Note (Signed)
No acid reflux on protonix.  Follow.

## 2016-04-02 NOTE — Assessment & Plan Note (Signed)
Seeing dermatology.  Minimal anterior chest rash.  Follow up with dermatology as discussed.

## 2016-04-02 NOTE — Assessment & Plan Note (Signed)
Low cholesterol diet and exercise.  On lipitor.  Last check improved.  Follow lipid panel and liver function tests.   Lab Results  Component Value Date   CHOL 163 01/21/2016   HDL 52 01/21/2016   LDLCALC 92 01/21/2016   TRIG 97 01/21/2016   CHOLHDL 3.1 01/21/2016

## 2016-04-28 ENCOUNTER — Ambulatory Visit
Admission: RE | Admit: 2016-04-28 | Discharge: 2016-04-28 | Disposition: A | Discharge status: Home / Self Care | Payer: 59 | Source: Ambulatory Visit | Attending: Internal Medicine | Admitting: Internal Medicine

## 2016-04-28 DIAGNOSIS — Z1231 Encounter for screening mammogram for malignant neoplasm of breast: Secondary | ICD-10-CM

## 2016-05-01 ENCOUNTER — Other Ambulatory Visit: Payer: Self-pay | Admitting: Internal Medicine

## 2016-05-01 DIAGNOSIS — R928 Other abnormal and inconclusive findings on diagnostic imaging of breast: Secondary | ICD-10-CM

## 2016-05-01 DIAGNOSIS — N6310 Unspecified lump in the right breast, unspecified quadrant: Secondary | ICD-10-CM

## 2016-05-01 NOTE — Progress Notes (Signed)
Order placed for diagnostic mammogram right breast with ultrasound.

## 2016-05-02 ENCOUNTER — Encounter: Payer: Self-pay | Admitting: Internal Medicine

## 2016-05-15 ENCOUNTER — Ambulatory Visit
Admission: RE | Admit: 2016-05-15 | Discharge: 2016-05-15 | Disposition: A | Discharge status: Home / Self Care | Payer: 59 | Source: Ambulatory Visit | Attending: Internal Medicine | Admitting: Internal Medicine

## 2016-05-15 DIAGNOSIS — R928 Other abnormal and inconclusive findings on diagnostic imaging of breast: Secondary | ICD-10-CM | POA: Diagnosis not present

## 2016-05-15 DIAGNOSIS — N6310 Unspecified lump in the right breast, unspecified quadrant: Secondary | ICD-10-CM

## 2016-06-06 ENCOUNTER — Other Ambulatory Visit: Payer: Self-pay | Admitting: Internal Medicine

## 2016-07-05 ENCOUNTER — Ambulatory Visit: Payer: 59 | Admitting: Internal Medicine

## 2016-08-23 ENCOUNTER — Encounter (HOSPITAL_COMMUNITY): Payer: Self-pay

## 2016-08-31 ENCOUNTER — Telehealth: Payer: Self-pay | Admitting: *Deleted

## 2016-08-31 NOTE — Telephone Encounter (Signed)
Opened in Error, pt will be seen in the morning.

## 2016-09-01 ENCOUNTER — Ambulatory Visit (INDEPENDENT_AMBULATORY_CARE_PROVIDER_SITE_OTHER): Payer: 59 | Admitting: Family Medicine

## 2016-09-01 ENCOUNTER — Encounter: Payer: Self-pay | Admitting: Family Medicine

## 2016-09-01 DIAGNOSIS — J029 Acute pharyngitis, unspecified: Secondary | ICD-10-CM | POA: Insufficient documentation

## 2016-09-01 LAB — POCT RAPID STREP A (OFFICE): Rapid Strep A Screen: NEGATIVE

## 2016-09-01 MED ORDER — AZITHROMYCIN 250 MG PO TABS: ORAL_TABLET | ORAL | 0 refills | Status: DC

## 2016-09-01 NOTE — Assessment & Plan Note (Signed)
New problem.  Strep negative. Given duration of symptoms/illness, treating with Azithromycin (to cover atypicals).

## 2016-09-01 NOTE — Progress Notes (Signed)
Subjective:  Patient ID: Kristine Hendricks, female    DOB: [birth year]  Age: 51 y.o. MRN: [id number]  CC: Sore throat, ear pain  HPI:  51 year old female with hypertension, OSA, morbid obesity, hyperlipidemia, cutaneous lupus presents with the above complaints.  Patient began to feel sick 5/31. She's had hoarseness, ear pain, and sore throat. Her most troublesome symptom currently is sore throat. She was recently seen at the walk-in clinic on 6/5. Diagnosed with an upper respiratory infection that was likely viral. Treated with Flonase and Tessalon. She denies any fever or chills. However, she continued to be bothered by severe sore throat. She has some ear discomfort as well. No other associated symptoms. No other complaints or concerns at this time.  Social Hx   Social History   Social History  . Marital status: Single    Spouse name: N/A  . Number of children: 0  . 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 Smoking status: Never Smoker  . Smokeless tobacco: Never Used  . Alcohol use No     Comment: occasional  . Drug use: No  . Sexual activity: Not Asked   Other Topics Concern  . None   Social History Narrative  . None   Review of Systems  Constitutional: Negative for fever.  HENT: Positive for ear pain, sore throat and voice change.    Objective:  BP 112/88 (BP Location: Left Arm, Patient Position: Sitting, Cuff Size: Large)   Pulse (!) 58   Temp 98.5 F (36.9 C) (Oral)   Resp 16   Wt (!) 303 lb (137.4 kg)   LMP 06/11/2003   SpO2 98%   BMI 52.01 kg/m   BP/Weight 09/01/2016 03/31/2016 32/20/2542  Systolic BP 706 237 628  Diastolic BP 88 84 88  Wt. (Lbs) 303 297.2 290  BMI 52.01 51.01 49.78   Physical Exam  Constitutional: She is oriented to person, place, and time. She appears well-developed. No distress.  Eyes: Conjunctivae are normal.  Neck: Neck supple.  Cardiovascular: Normal rate and regular rhythm.    Pulmonary/Chest: Effort normal. She has no wheezes.  Neurological: She is alert and oriented to person, place, and time.  Vitals reviewed.   Lab Results  Component Value Date   WBC 5.4 07/16/2015   HGB 14.3 07/16/2015   HCT 41.6 07/16/2015   PLT 255 07/16/2015   GLUCOSE 82 01/21/2016   CHOL 163 01/21/2016   TRIG 97 01/21/2016   HDL 52 01/21/2016   LDLCALC 92 01/21/2016   ALT 13 01/21/2016   AST 18 01/21/2016   NA 142 01/21/2016   K 4.6 01/21/2016   CL 101 01/21/2016   CREATININE 0.81 01/21/2016   BUN 16 01/21/2016   CO2 26 01/21/2016   TSH 3.630 01/21/2016   HGBA1C 5.3 03/19/2015    Assessment & Plan:   Problem List Items Addressed This Visit      Respiratory   Pharyngitis    New problem.  Strep negative. Given duration of symptoms/illness, treating with Azithromycin (to cover atypicals).      Relevant Orders   POCT rapid strep A (Completed)      Meds ordered this encounter  Medications  . benzonatate (TESSALON) 200 MG capsule  . fluticasone (FLONASE) 50 MCG/ACT nasal spray    Sig: Place into the nose.  Marland Kitchen azithromycin (ZITHROMAX) 250 MG tablet    Sig: 2 tablets on Day 1, then  1 tablet daily on Days 2-5    Dispense:  6 tablet    Refill:  0    Follow-up: PRN  [facility]

## 2016-09-01 NOTE — Patient Instructions (Signed)
Antibiotic if needed.  Take care  Dr. Lacinda Axon

## 2016-09-14 ENCOUNTER — Other Ambulatory Visit: Payer: Self-pay | Admitting: Internal Medicine

## 2016-09-15 ENCOUNTER — Other Ambulatory Visit: Payer: Self-pay | Admitting: Internal Medicine

## 2016-12-06 LAB — BASIC METABOLIC PANEL
BUN / CREAT RATIO: 15 (ref 9–23)
BUN: 13 mg/dL (ref 6–24)
CO2: 28 mmol/L (ref 20–29)
CREATININE: 0.84 mg/dL (ref 0.57–1.00)
Calcium: 9.5 mg/dL (ref 8.7–10.2)
Chloride: 101 mmol/L (ref 96–106)
GFR calc non Af Amer: 80 mL/min/{1.73_m2} (ref >59)
GFR, EST AFRICAN AMERICAN: 92 mL/min/{1.73_m2} (ref >59)
Glucose: 86 mg/dL (ref 65–99)
Potassium: 4.8 mmol/L (ref 3.5–5.2)
Sodium: 142 mmol/L (ref 134–144)

## 2016-12-06 LAB — CBC WITH DIFFERENTIAL/PLATELET
Basophils Absolute: 0 x10E3/uL (ref 0.0–0.2)
Basos: 1 %
EOS (ABSOLUTE): 0.2 x10E3/uL (ref 0.0–0.4)
Eos: 3 %
Hematocrit: 39.6 % (ref 34.0–46.6)
Hemoglobin: 13.2 g/dL (ref 11.1–15.9)
Immature Grans (Abs): 0 x10E3/uL (ref 0.0–0.1)
Immature Granulocytes: 0 %
Lymphocytes Absolute: 1.6 x10E3/uL (ref 0.7–3.1)
Lymphs: 22 %
MCH: 27.8 pg (ref 26.6–33.0)
MCHC: 33.3 g/dL (ref 31.5–35.7)
MCV: 83 fL (ref 79–97)
Monocytes Absolute: 0.6 x10E3/uL (ref 0.1–0.9)
Monocytes: 8 %
Neutrophils Absolute: 4.6 x10E3/uL (ref 1.4–7.0)
Neutrophils: 66 %
Platelets: 269 x10E3/uL (ref 150–379)
RBC: 4.75 x10E6/uL (ref 3.77–5.28)
RDW: 14.1 % (ref 12.3–15.4)
WBC: 7 x10E3/uL (ref 3.4–10.8)

## 2016-12-06 LAB — VITAMIN D 25 HYDROXY (VIT D DEFICIENCY, FRACTURES): VIT D 25 HYDROXY: 23.7 ng/mL — AB (ref 30.0–100.0)

## 2016-12-06 LAB — LIPID PANEL
CHOL/HDL RATIO: 3.1 ratio (ref 0.0–4.4)
Cholesterol, Total: 162 mg/dL (ref 100–199)
HDL: 52 mg/dL (ref >39)
LDL CALC: 83 mg/dL (ref 0–99)
TRIGLYCERIDES: 133 mg/dL (ref 0–149)
VLDL Cholesterol Cal: 27 mg/dL (ref 5–40)

## 2016-12-06 LAB — HEPATIC FUNCTION PANEL
ALBUMIN: 4.2 g/dL (ref 3.5–5.5)
ALK PHOS: 91 IU/L (ref 39–117)
ALT: 34 IU/L — AB (ref 0–32)
AST: 38 IU/L (ref 0–40)
BILIRUBIN TOTAL: 0.3 mg/dL (ref 0.0–1.2)
Bilirubin, Direct: 0.09 mg/dL (ref 0.00–0.40)
Total Protein: 6.5 g/dL (ref 6.0–8.5)

## 2016-12-06 LAB — TSH: TSH: 4.79 u[IU]/mL — ABNORMAL HIGH (ref 0.450–4.500)

## 2016-12-07 ENCOUNTER — Encounter: Payer: Self-pay | Admitting: Internal Medicine

## 2016-12-07 ENCOUNTER — Ambulatory Visit (INDEPENDENT_AMBULATORY_CARE_PROVIDER_SITE_OTHER): Payer: 59 | Admitting: Internal Medicine

## 2016-12-07 DIAGNOSIS — E7800 Pure hypercholesterolemia, unspecified: Secondary | ICD-10-CM | POA: Diagnosis not present

## 2016-12-07 DIAGNOSIS — Z6841 Body Mass Index (BMI) 40.0 and over, adult: Secondary | ICD-10-CM | POA: Diagnosis not present

## 2016-12-07 DIAGNOSIS — H938X2 Other specified disorders of left ear: Secondary | ICD-10-CM

## 2016-12-07 DIAGNOSIS — R946 Abnormal results of thyroid function studies: Secondary | ICD-10-CM | POA: Diagnosis not present

## 2016-12-07 DIAGNOSIS — K219 Gastro-esophageal reflux disease without esophagitis: Secondary | ICD-10-CM

## 2016-12-07 DIAGNOSIS — K2100 Gastro-esophageal reflux disease with esophagitis, without bleeding: Secondary | ICD-10-CM

## 2016-12-07 DIAGNOSIS — Z23 Encounter for immunization: Secondary | ICD-10-CM | POA: Diagnosis not present

## 2016-12-07 DIAGNOSIS — I10 Essential (primary) hypertension: Secondary | ICD-10-CM | POA: Diagnosis not present

## 2016-12-07 DIAGNOSIS — R7989 Other specified abnormal findings of blood chemistry: Secondary | ICD-10-CM

## 2016-12-07 NOTE — Progress Notes (Signed)
Patient ID: Kristine Hendricks, female   DOB: Jun 07, 1965, 51 y.o.   MRN: [id number]   Subjective:    Patient ID: Kristine Hendricks, female    DOB: [birth year], 51 y.o.   MRN: [id number]  HPI  Patient here for a scheduled follow up. Was evaluated by GI 09/2016 for dysphagia and abdominal pain.  Was instructed to increase protonix to bid.  She saw her surgeon.  Had all fluid removed from her band.  Since this procedure, has had no problems.  Eating.  No pain.  No swallowing issues.  No acid reflux.  She is back on q day protonix.  No chest pain.  Breathing stable.  She has not been watching her diet as well.  Has gained weight.  Has started walking more and plans to continue to exercise more.  Also reports some left side congestion.  Left ear fullness with some decreased hearing.  No ear pain.  Taking some mucinex and using saline.  No chest congestion or cough.     Past Medical History:  Diagnosis Date  . Anemia   . Asthma   . GERD (gastroesophageal reflux disease)   . History of chicken pox   . Hypercholesterolemia   . Hypertension   . Seasonal allergies   . Sleep apnea    Past Surgical History:  Procedure Laterality Date  . arthroscopic surgery     Dr Sabra Heck  . BREAST CYST ASPIRATION Right 2005  . BREAST SURGERY  4 /05   biopsy  . CHOLECYSTECTOMY  2007  . COLONOSCOPY WITH PROPOFOL N/A 02/14/2016   Procedure: COLONOSCOPY WITH PROPOFOL;  Surgeon: Manya Silvas, MD;  Location: [facility] ENDOSCOPY;  Service: Endoscopy;  Laterality: N/A;  . DILATION AND CURETTAGE OF UTERUS  8/04  . LAPAROSCOPIC GASTRIC BANDING  01/29/09  . MYOMECTOMY  9/04  . VAGINAL HYSTERECTOMY  06/11/03   Family History  Problem Relation Age of Onset  . Lung cancer Mother   . Arthritis Mother   . Stroke Mother   . Hypertension Mother   . Colon polyps Mother   . Thyroid disease Sister        graves disease  . Colon cancer Unknown        maternal grandparent  . Hyperlipidemia Father   . Stroke Maternal Aunt   .  Stroke Maternal Uncle   . Cancer Maternal Grandmother        colon  . Alcohol abuse Cousin   . Breast cancer Neg Hx    Social History   Social History  . Marital status: Single    Spouse name: N/A  . Number of children: 0  . 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 Smoking status: Never Smoker  . Smokeless tobacco: Never Used  . Alcohol use No     Comment: occasional  . Drug use: No  . Sexual activity: Not Asked   Other Topics Concern  . None   Social History Narrative  . None    Outpatient Encounter Prescriptions as of 12/07/2016  Medication Sig  . atorvastatin (LIPITOR) 10 MG tablet TAKE 1 TABLET BY MOUTH  DAILY  . CALCIUM PO Take 300 mg by mouth 4 (four) times daily.   . fluticasone (FLONASE) 50 MCG/ACT nasal spray Place into the nose.  . losartan (COZAAR) 100 MG tablet TAKE 1 TABLET BY MOUTH  DAILY  . Multiple Vitamin (MULTIVITAMIN PO) Take by  mouth daily.    . pantoprazole (PROTONIX) 40 MG tablet TAKE 1 TABLET BY MOUTH  DAILY  . venlafaxine XR (EFFEXOR-XR) 37.5 MG 24 hr capsule TAKE 1 CAPSULE BY MOUTH  DAILY WITH BREAKFAST  . [DISCONTINUED] azithromycin (ZITHROMAX) 250 MG tablet 2 tablets on Day 1, then 1 tablet daily on Days 2-5  . [DISCONTINUED] benzonatate (TESSALON) 200 MG capsule    No facility-administered encounter medications on file as of 12/07/2016.     Review of Systems  Constitutional: Negative for appetite change and unexpected weight change.  HENT: Positive for congestion.        Left ear fullness and some decreased hearing.   Respiratory: Negative for cough, chest tightness and shortness of breath.   Cardiovascular: Negative for chest pain, palpitations and leg swelling.  Gastrointestinal: Negative for abdominal pain, diarrhea, nausea and vomiting.  Genitourinary: Negative for difficulty urinating and dysuria.  Musculoskeletal: Negative for back pain and joint swelling.  Skin: Negative for  color change and rash.  Neurological: Negative for dizziness, light-headedness and headaches.  Psychiatric/Behavioral: Negative for agitation and dysphoric mood.       Objective:    Physical Exam  Constitutional: She appears well-developed and well-nourished. No distress.  HENT:  Nose: Nose normal.  Mouth/Throat: Oropharynx is clear and moist.  Neck: Neck supple. No thyromegaly present.  Cardiovascular: Normal rate and regular rhythm.   Pulmonary/Chest: Breath sounds normal. No respiratory distress. She has no wheezes.  Abdominal: Soft. Bowel sounds are normal. There is no tenderness.  Musculoskeletal: She exhibits no edema or tenderness.  Lymphadenopathy:    She has no cervical adenopathy.  Skin: No rash noted. No erythema.  Psychiatric: She has a normal mood and affect. Her behavior is normal.    BP 120/80 (BP Location: Left Arm, Patient Position: Sitting, Cuff Size: Large)   Pulse 66   Temp 98.4 F (36.9 C) (Oral)   Resp 16   Ht 5' 4.17" (1.63 m)   Wt (!) 318 lb (144.2 kg)   LMP 06/11/2003   SpO2 95%   BMI 54.29 kg/m  Wt Readings from Last 3 Encounters:  12/07/16 (!) 318 lb (144.2 kg)  09/01/16 (!) 303 lb (137.4 kg)  03/31/16 297 lb 3.2 oz (134.8 kg)     Lab Results  Component Value Date   WBC 7.0 12/05/2016   HGB 13.2 12/05/2016   HCT 39.6 12/05/2016   PLT 269 12/05/2016   GLUCOSE 86 12/05/2016   CHOL 162 12/05/2016   TRIG 133 12/05/2016   HDL 52 12/05/2016   LDLCALC 83 12/05/2016   ALT 34 (H) 12/05/2016   AST 38 12/05/2016   NA 142 12/05/2016   K 4.8 12/05/2016   CL 101 12/05/2016   CREATININE 0.84 12/05/2016   BUN 13 12/05/2016   CO2 28 12/05/2016   TSH 4.790 (H) 12/05/2016   HGBA1C 5.3 03/19/2015    Mm Diag Breast Tomo Uni Right  Result Date: 05/15/2016 CLINICAL DATA:  Recall from screening mammography with tomosynthesis, possible subtle architectural distortion involving the lower outer right breast at middle depth. EXAM: 2D DIGITAL  DIAGNOSTIC UNILATERAL RIGHT MAMMOGRAM WITH CAD AND ADJUNCT TOMO COMPARISON:  04/28/2016, 04/01/2015, and earlier. ACR Breast Density Category b: There are scattered areas of fibroglandular density. FINDINGS: Standard and tomosynthesis spot-compression CC and MLO views of the area of concern in the right breast and a standard 2D and tomosynthesis full field mediolateral view of the right breast were obtained. No persistent architectural distortion in the area  of concern in the lower outer right breast. No underlying mass. No suspicious findings in the right breast on the full field mediolateral image. The full field mediolateral image was processed with CAD. IMPRESSION: No mammographic evidence of malignancy, right breast. RECOMMENDATION: Screening mammogram in one year.(Code:SM-B-01Y) I have discussed the findings and recommendations with the patient. Results were also provided in writing at the conclusion of the visit. If applicable, a reminder letter will be sent to the patient regarding the next appointment. BI-RADS CATEGORY  1: Negative. Electronically Signed   By: Evangeline Dakin M.D.   On: 05/15/2016 14:24       Assessment & Plan:   Problem List Items Addressed This Visit    BMI 50.0-59.9, adult ([place])    Discussed weight gain.  Discussed diet and exercise.  She has started walking.  Plans to exercise more.  Follow.        Ear fullness, left    TMs clear.  Question of minimal fluid.  Afrin nasal spray and nasacort nasal spray as directed.   Call with update.  May need ENT evaluation, especially if decreased hearing continues.        Elevated TSH    Slight elevation - tsh.  Recheck tsh in 6-8 weeks.        Relevant Orders   TSH   GERD (gastroesophageal reflux disease)    On protonix q day now.  No upper symptoms.  Follow.       Hypercholesterolemia    On lipitor.  Low cholesterol diet and exercise.  Follow lipid panel.   Lab Results  Component Value Date   CHOL 162 12/05/2016   HDL  52 12/05/2016   LDLCALC 83 12/05/2016   TRIG 133 12/05/2016   CHOLHDL 3.1 12/05/2016        Hypertension    Blood pressure under good control.  Continue same medication regimen.  Follow pressures.  Follow metabolic panel.         Other Visit Diagnoses    Need for immunization against influenza       Relevant Orders   Flu Vaccine QUAD 36+ mos IM (Completed)       Einar Pheasant, MD

## 2016-12-07 NOTE — Patient Instructions (Signed)
Afrin nasal spray - 2 sprays each nostril 2x/day for three days only and then stop  nasacort nasal spray - 2 sprays each nostril one time per day.  Do this in the evening.   Call with update.

## 2016-12-09 ENCOUNTER — Encounter: Payer: Self-pay | Admitting: Internal Medicine

## 2016-12-09 DIAGNOSIS — H938X9 Other specified disorders of ear, unspecified ear: Secondary | ICD-10-CM | POA: Insufficient documentation

## 2016-12-09 DIAGNOSIS — R7989 Other specified abnormal findings of blood chemistry: Secondary | ICD-10-CM | POA: Insufficient documentation

## 2016-12-09 NOTE — Assessment & Plan Note (Signed)
On protonix q day now.  No upper symptoms.  Follow.

## 2016-12-09 NOTE — Assessment & Plan Note (Signed)
Blood pressure under good control.  Continue same medication regimen.  Follow pressures.  Follow metabolic panel.   

## 2016-12-09 NOTE — Assessment & Plan Note (Signed)
On lipitor.  Low cholesterol diet and exercise.  Follow lipid panel.   Lab Results  Component Value Date   CHOL 162 12/05/2016   HDL 52 12/05/2016   LDLCALC 83 12/05/2016   TRIG 133 12/05/2016   CHOLHDL 3.1 12/05/2016

## 2016-12-09 NOTE — Assessment & Plan Note (Signed)
Discussed weight gain.  Discussed diet and exercise.  She has started walking.  Plans to exercise more.  Follow.

## 2016-12-09 NOTE — Assessment & Plan Note (Signed)
TMs clear.  Question of minimal fluid.  Afrin nasal spray and nasacort nasal spray as directed.   Call with update.  May need ENT evaluation, especially if decreased hearing continues.

## 2016-12-09 NOTE — Assessment & Plan Note (Signed)
Slight elevation - tsh.  Recheck tsh in 6-8 weeks.

## 2016-12-13 ENCOUNTER — Encounter: Payer: Self-pay | Admitting: Internal Medicine

## 2016-12-13 DIAGNOSIS — H938X2 Other specified disorders of left ear: Secondary | ICD-10-CM

## 2016-12-13 DIAGNOSIS — H9192 Unspecified hearing loss, left ear: Secondary | ICD-10-CM

## 2016-12-14 NOTE — Telephone Encounter (Signed)
Order placed for ENT evaluation.

## 2017-02-20 ENCOUNTER — Ambulatory Visit: Payer: 59 | Admitting: Internal Medicine

## 2017-02-20 ENCOUNTER — Other Ambulatory Visit: Payer: Self-pay | Admitting: Internal Medicine

## 2017-02-24 LAB — TSH: TSH: 4.4 u[IU]/mL (ref 0.450–4.500)

## 2017-02-25 ENCOUNTER — Encounter: Payer: Self-pay | Admitting: Internal Medicine

## 2017-02-25 DIAGNOSIS — R7989 Other specified abnormal findings of blood chemistry: Secondary | ICD-10-CM

## 2017-02-25 DIAGNOSIS — R945 Abnormal results of liver function studies: Secondary | ICD-10-CM

## 2017-02-26 NOTE — Telephone Encounter (Signed)
Order placed for f/u liver panel.  

## 2017-02-27 ENCOUNTER — Other Ambulatory Visit: Payer: Self-pay | Admitting: Internal Medicine

## 2017-03-02 ENCOUNTER — Other Ambulatory Visit: Payer: Self-pay | Admitting: Internal Medicine

## 2017-03-03 ENCOUNTER — Encounter: Payer: Self-pay | Admitting: Internal Medicine

## 2017-03-03 LAB — HEPATIC FUNCTION PANEL
ALK PHOS: 91 IU/L (ref 39–117)
ALT: 19 IU/L (ref 0–32)
AST: 23 IU/L (ref 0–40)
Albumin: 4.2 g/dL (ref 3.5–5.5)
BILIRUBIN TOTAL: 0.4 mg/dL (ref 0.0–1.2)
BILIRUBIN, DIRECT: 0.12 mg/dL (ref 0.00–0.40)
TOTAL PROTEIN: 6.4 g/dL (ref 6.0–8.5)

## 2017-05-13 ENCOUNTER — Emergency Department: Payer: Managed Care, Other (non HMO)

## 2017-05-13 ENCOUNTER — Encounter: Payer: Self-pay | Admitting: Emergency Medicine

## 2017-05-13 ENCOUNTER — Emergency Department
Admission: EM | Admit: 2017-05-13 | Discharge: 2017-05-13 | Disposition: A | Discharge status: Home / Self Care | Payer: Managed Care, Other (non HMO) | Attending: Emergency Medicine | Admitting: Emergency Medicine

## 2017-05-13 DIAGNOSIS — I10 Essential (primary) hypertension: Secondary | ICD-10-CM | POA: Insufficient documentation

## 2017-05-13 DIAGNOSIS — M545 Low back pain, unspecified: Secondary | ICD-10-CM

## 2017-05-13 DIAGNOSIS — Z79899 Other long term (current) drug therapy: Secondary | ICD-10-CM | POA: Diagnosis not present

## 2017-05-13 DIAGNOSIS — Z9049 Acquired absence of other specified parts of digestive tract: Secondary | ICD-10-CM | POA: Diagnosis not present

## 2017-05-13 DIAGNOSIS — Z9884 Bariatric surgery status: Secondary | ICD-10-CM | POA: Insufficient documentation

## 2017-05-13 DIAGNOSIS — J45909 Unspecified asthma, uncomplicated: Secondary | ICD-10-CM | POA: Insufficient documentation

## 2017-05-13 DIAGNOSIS — M549 Dorsalgia, unspecified: Secondary | ICD-10-CM | POA: Diagnosis present

## 2017-05-13 MED ORDER — NAPROXEN 500 MG PO TABS: 500.0000 mg | ORAL_TABLET | Freq: Two times a day (BID) | ORAL | 0 refills | Status: DC

## 2017-05-13 MED ORDER — HYDROCODONE-ACETAMINOPHEN 5-325 MG PO TABS: 1.0000 | ORAL_TABLET | Freq: Four times a day (QID) | ORAL | 0 refills | Status: DC | PRN

## 2017-05-13 MED ORDER — CYCLOBENZAPRINE HCL 10 MG PO TABS: 10.0000 mg | ORAL_TABLET | Freq: Three times a day (TID) | ORAL | 0 refills | Status: DC | PRN

## 2017-05-13 NOTE — ED Notes (Signed)
See triage note  States she slipped while getting out of tub last pm  having pain to lower back.. States having increased pain with ambulation  States pain is not moving into legs

## 2017-05-13 NOTE — ED Provider Notes (Signed)
[facility] Emergency Department Provider Note ____________________________________________  Time seen: Approximately 11:08 AM  I have reviewed the triage vital signs and the nursing notes.   HISTORY  Chief Complaint Back Pain    HPI Kristine Hendricks is a 52 y.o. female who presents to the emergency department for treatment and evaluation of back pain. Last night, she slipped while getting out of the bathtub. She was able to catch herself, but landed on her bottom pretty hard. Pain has gradually worsened since the injury. No relief with OTC medications. Pain does not radiate. No history of back pain or injury.  Past Medical History:  Diagnosis Date  . Anemia   . Asthma   . GERD (gastroesophageal reflux disease)   . History of chicken pox   . Hypercholesterolemia   . Hypertension   . Seasonal allergies   . Sleep apnea     Patient Active Problem List   Diagnosis Date Noted  . Ear fullness, left 12/09/2016  . Elevated TSH 12/09/2016  . Pharyngitis 09/01/2016  . History of colonic polyps 02/17/2016  . Health care maintenance 04/01/2015  . Subacute cutaneous lupus erythematosus 05/25/2014  . Menopausal syndrome 06/09/2012  . BMI 50.0-59.9, adult ([place]) 02/19/2012  . Sleep apnea 02/17/2012  . GERD (gastroesophageal reflux disease) 02/17/2012  . Hypertension 02/12/2012  . Hypercholesterolemia 02/12/2012    Past Surgical History:  Procedure Laterality Date  . arthroscopic surgery     Dr Sabra Heck  . BREAST CYST ASPIRATION Right 2005  . BREAST SURGERY  4 /05   biopsy  . CHOLECYSTECTOMY  2007  . COLONOSCOPY WITH PROPOFOL N/A 02/14/2016   Procedure: COLONOSCOPY WITH PROPOFOL;  Surgeon: Manya Silvas, MD;  Location: [facility] ENDOSCOPY;  Service: Endoscopy;  Laterality: N/A;  . DILATION AND CURETTAGE OF UTERUS  8/04  . LAPAROSCOPIC GASTRIC BANDING  01/29/09  . MYOMECTOMY  9/04  . VAGINAL HYSTERECTOMY  06/11/03    Prior to Admission medications    Medication Sig Start Date End Date Taking? Authorizing Provider  atorvastatin (LIPITOR) 10 MG tablet TAKE 1 TABLET BY MOUTH  DAILY 02/21/17   Einar Pheasant, MD  CALCIUM PO Take 300 mg by mouth 4 (four) times daily.     [provider]  cyclobenzaprine (FLEXERIL) 10 MG tablet Take 1 tablet (10 mg total) by mouth 3 (three) times daily as needed for muscle spasms. 05/13/17   Sedale Jenifer, Johnette Abraham B, FNP  fluticasone (FLONASE) 50 MCG/ACT nasal spray Place into the nose. 08/22/16   [provider]  HYDROcodone-acetaminophen (NORCO/VICODIN) 5-325 MG tablet Take 1 tablet by mouth every 6 (six) hours as needed for moderate pain. 05/13/17 05/13/18  Kirstan Fentress, Johnette Abraham B, FNP  losartan (COZAAR) 100 MG tablet TAKE 1 TABLET BY MOUTH  DAILY 03/05/17   Einar Pheasant, MD  Multiple Vitamin (MULTIVITAMIN PO) Take by mouth daily.      [provider]  naproxen (NAPROSYN) 500 MG tablet Take 1 tablet (500 mg total) by mouth 2 (two) times daily with a meal. 05/13/17   Cylan Borum B, FNP  pantoprazole (PROTONIX) 40 MG tablet TAKE 1 TABLET BY MOUTH  DAILY 02/28/17   Einar Pheasant, MD  venlafaxine XR (EFFEXOR-XR) 37.5 MG 24 hr capsule TAKE 1 CAPSULE BY MOUTH  DAILY WITH BREAKFAST 03/05/17   Einar Pheasant, MD    Allergies Sulfa antibiotics; Sulfasalazine; and Codeine  Family History  Problem Relation Age of Onset  . Lung cancer Mother   . Arthritis Mother   . Stroke  Mother   . Hypertension Mother   . Colon polyps Mother   . Thyroid disease Sister        graves disease  . Colon cancer Unknown        maternal grandparent  . Hyperlipidemia Father   . Stroke Maternal Aunt   . Stroke Maternal Uncle   . Cancer Maternal Grandmother        colon  . Alcohol abuse Cousin   . Breast cancer Neg Hx     Social History Social History   Tobacco Use  . Smoking status: Never Smoker  . Smokeless tobacco: Never Used  Substance Use Topics  . Alcohol use: No    Alcohol/week: 0.0 oz    Comment:  occasional  . Drug use: No    Review of Systems Constitutional: Negative for fever. Cardiovascular: Negative for chest pain Respiratory: Negative for cough Musculoskeletal: Positive for back pain Skin: Negative for lesion or wound Neurological: Negative for radiculopathy or paresthesias  ____________________________________________   PHYSICAL EXAM:  VITAL SIGNS: ED Triage Vitals  Enc Vitals Group     BP 05/13/17 0916 123/80     Pulse Rate 05/13/17 0916 64     Resp 05/13/17 0916 18     Temp 05/13/17 0916 98 F (36.7 C)     Temp Source 05/13/17 0916 Oral     SpO2 05/13/17 0916 98 %     Weight 05/13/17 0917 300 lb (136.1 kg)     Height 05/13/17 0917 5\' 4"  (1.626 m)     Head Circumference --      Peak Flow --      Pain Score 05/13/17 0912 9     Pain Loc --      Pain Edu? --      Excl. in [place]? --     Constitutional: Alert and oriented. Well appearing and in no acute distress. Eyes: Conjunctivae are clear without discharge or drainage Head: Atraumatic Neck: Supple Respiratory: Respirations even and unlabored. Musculoskeletal: Reproducible lower back pain with palpation.  No focal tenderness of the lumbar spine, coccyx, or sacrum. Neurologic: Straight leg raise negative bilaterally Skin: Intact Psychiatric: Affect and behavior are appropriate  ____________________________________________   LABS (all labs ordered are listed, but only abnormal results are displayed)  Labs Reviewed - No data to display ____________________________________________  RADIOLOGY  Image of the lumbar spine reveals no fracture, spondylolisthesis or acute finding.  There are some mild degenerative changes. ____________________________________________   PROCEDURES  Procedures  ____________________________________________   INITIAL IMPRESSION / ASSESSMENT AND PLAN / ED COURSE  Kristine Hendricks is a 52 y.o. female who presents to the emergency department for treatment and evaluation  of lower back pain after slipping and falling in the bathtub last night.  Images and exam are consistent with muscle strain.  She will be given a prescription for Naprosyn, Flexeril, and Norco.  She is to follow-up with her primary care provider or orthopedics for symptoms that are not improving over the next several days.  She was instructed to rest and use ice over the next few days.  She was instructed to return to the emergency department for symptoms of change or worsen if she is unable to schedule an appointment.  Medications - No data to display  Pertinent labs & imaging results that were available during my care of the patient were reviewed by me and considered in my medical decision making (see chart for details).  _________________________________________   FINAL CLINICAL IMPRESSION(S) /  ED DIAGNOSES  Final diagnoses:  Acute lumbar back pain    ED Discharge Orders        Ordered    naproxen (NAPROSYN) 500 MG tablet  2 times daily with meals     05/13/17 1103    cyclobenzaprine (FLEXERIL) 10 MG tablet  3 times daily PRN     05/13/17 1103    HYDROcodone-acetaminophen (NORCO/VICODIN) 5-325 MG tablet  Every 6 hours PRN     05/13/17 1103       If controlled substance prescribed during this visit, 12 month history viewed on the [place] prior to issuing an initial prescription for Schedule II or III opiod.    Victorino Dike, FNP 05/13/17 1557    Lisa Roca, MD 05/14/17 [phone]

## 2017-05-13 NOTE — ED Triage Notes (Signed)
Slipped and fell last night and now having low back pain.

## 2017-05-21 ENCOUNTER — Ambulatory Visit (INDEPENDENT_AMBULATORY_CARE_PROVIDER_SITE_OTHER): Payer: Managed Care, Other (non HMO) | Admitting: Internal Medicine

## 2017-05-21 VITALS — BP 132/84 | HR 74 | Temp 98.5°F | Resp 18 | Wt 325.6 lb

## 2017-05-21 DIAGNOSIS — E7800 Pure hypercholesterolemia, unspecified: Secondary | ICD-10-CM

## 2017-05-21 DIAGNOSIS — K2100 Gastro-esophageal reflux disease with esophagitis, without bleeding: Secondary | ICD-10-CM

## 2017-05-21 DIAGNOSIS — E559 Vitamin D deficiency, unspecified: Secondary | ICD-10-CM

## 2017-05-21 DIAGNOSIS — L931 Subacute cutaneous lupus erythematosus: Secondary | ICD-10-CM

## 2017-05-21 DIAGNOSIS — Z6841 Body Mass Index (BMI) 40.0 and over, adult: Secondary | ICD-10-CM | POA: Diagnosis not present

## 2017-05-21 DIAGNOSIS — M549 Dorsalgia, unspecified: Secondary | ICD-10-CM | POA: Diagnosis not present

## 2017-05-21 DIAGNOSIS — Z1239 Encounter for other screening for malignant neoplasm of breast: Secondary | ICD-10-CM

## 2017-05-21 DIAGNOSIS — Z1231 Encounter for screening mammogram for malignant neoplasm of breast: Secondary | ICD-10-CM

## 2017-05-21 DIAGNOSIS — Z0000 Encounter for general adult medical examination without abnormal findings: Secondary | ICD-10-CM | POA: Diagnosis not present

## 2017-05-21 DIAGNOSIS — I10 Essential (primary) hypertension: Secondary | ICD-10-CM

## 2017-05-21 MED ORDER — CYCLOBENZAPRINE HCL 10 MG PO TABS: 10.0000 mg | ORAL_TABLET | Freq: Three times a day (TID) | ORAL | 0 refills | Status: DC | PRN

## 2017-05-21 NOTE — Progress Notes (Signed)
Patient ID: Kristine Hendricks, female   DOB: 1965-11-02, 52 y.o.   MRN: [id number]   Subjective:    Patient ID: Kristine Hendricks, female    DOB: [birth year], 52 y.o.   MRN: [id number]  HPI  Patient here for her physical exam.  She was seen recently in the ER after slipping - getting out of the bathtub.  States one leg was on one of the tub and one leg on the other.  She twisted and injured her back.  No actual fall.  No head injury.  Was given flexeril, naprosyn and norco.  She has been taking naprosyn and flexeril.  Not taking norco and desires not to take.  Pain has improved some, but still with increased pain.  No chest pain.  No sob.  States she had been doing well prior to slipping.  Was exercising.  Not able to exercise now.  Plans to restart.  No acid reflux.  Swallowing better.  No abdominal pain.  Bowels moving.  Has noticed a rash on her chest and arms.  Recurred.  Has seen dermatology.     Past Medical History:  Diagnosis Date  . Anemia   . Asthma   . GERD (gastroesophageal reflux disease)   . History of chicken pox   . Hypercholesterolemia   . Hypertension   . Seasonal allergies   . Sleep apnea    Past Surgical History:  Procedure Laterality Date  . arthroscopic surgery     Dr Sabra Heck  . BREAST CYST ASPIRATION Right 2005  . BREAST SURGERY  4 /05   biopsy  . CHOLECYSTECTOMY  2007  . COLONOSCOPY WITH PROPOFOL N/A 02/14/2016   Procedure: COLONOSCOPY WITH PROPOFOL;  Surgeon: Manya Silvas, MD;  Location: [facility] ENDOSCOPY;  Service: Endoscopy;  Laterality: N/A;  . DILATION AND CURETTAGE OF UTERUS  8/04  . LAPAROSCOPIC GASTRIC BANDING  01/29/09  . MYOMECTOMY  9/04  . VAGINAL HYSTERECTOMY  06/11/03   Family History  Problem Relation Age of Onset  . Lung cancer Mother   . Arthritis Mother   . Stroke Mother   . Hypertension Mother   . Colon polyps Mother   . Thyroid disease Sister        graves disease  . Colon cancer Unknown        maternal grandparent  .  Hyperlipidemia Father   . Stroke Maternal Aunt   . Stroke Maternal Uncle   . Cancer Maternal Grandmother        colon  . Alcohol abuse Cousin   . Breast cancer Neg Hx    Social History   Socioeconomic History  . Marital status: Single    Spouse name: None  . Number of children: 0  . Years of education: None  . Highest education level: None  Social Needs  . Financial resource strain: None  . Food insecurity - worry: None  . Food insecurity - inability: None  . Transportation needs - medical: None  . Transportation needs - non-medical: None  Occupational History    Employer: [employer]  Tobacco Use  . Smoking status: Never Smoker  . Smokeless tobacco: Never Used  Substance and Sexual Activity  . Alcohol use: No    Alcohol/week: 0.0 oz    Comment: occasional  . Drug use: No  . Sexual activity: None  Other Topics Concern  . None  Social History Narrative  . None    Outpatient Encounter Medications as of 05/21/2017  Medication Sig  .  atorvastatin (LIPITOR) 10 MG tablet TAKE 1 TABLET BY MOUTH  DAILY  . CALCIUM PO Take 300 mg by mouth 4 (four) times daily.   . cyclobenzaprine (FLEXERIL) 10 MG tablet Take 1 tablet (10 mg total) by mouth 3 (three) times daily as needed for muscle spasms.  . fluticasone (FLONASE) 50 MCG/ACT nasal spray Place into the nose.  . losartan (COZAAR) 100 MG tablet TAKE 1 TABLET BY MOUTH  DAILY  . Multiple Vitamin (MULTIVITAMIN PO) Take by mouth daily.    . pantoprazole (PROTONIX) 40 MG tablet TAKE 1 TABLET BY MOUTH  DAILY  . venlafaxine XR (EFFEXOR-XR) 37.5 MG 24 hr capsule TAKE 1 CAPSULE BY MOUTH  DAILY WITH BREAKFAST  . [DISCONTINUED] cyclobenzaprine (FLEXERIL) 10 MG tablet Take 1 tablet (10 mg total) by mouth 3 (three) times daily as needed for muscle spasms.  . [DISCONTINUED] naproxen (NAPROSYN) 500 MG tablet Take 1 tablet (500 mg total) by mouth 2 (two) times daily with a meal.  . HYDROcodone-acetaminophen (NORCO/VICODIN) 5-325 MG tablet Take 1  tablet by mouth every 6 (six) hours as needed for moderate pain. (Patient not taking: Reported on 05/21/2017)   No facility-administered encounter medications on file as of 05/21/2017.     Review of Systems  Constitutional: Negative for appetite change.       Has gained weight.    HENT: Negative for congestion and sinus pressure.   Respiratory: Negative for cough, chest tightness and shortness of breath.   Cardiovascular: Negative for chest pain, palpitations and leg swelling.  Gastrointestinal: Negative for abdominal pain, diarrhea, nausea and vomiting.  Genitourinary: Negative for difficulty urinating and dysuria.  Musculoskeletal: Negative for joint swelling and myalgias.       Back pain as outlined.    Skin: Negative for color change and rash.  Neurological: Negative for dizziness, light-headedness and headaches.  Psychiatric/Behavioral: Negative for agitation and dysphoric mood.       Objective:     Blood pressure rechecked by me:  132/84  Physical Exam  Constitutional: She appears well-developed and well-nourished. No distress.  HENT:  Nose: Nose normal.  Mouth/Throat: Oropharynx is clear and moist.  Neck: Neck supple. No thyromegaly present.  Cardiovascular: Normal rate and regular rhythm.  Pulmonary/Chest: Breath sounds normal. No respiratory distress. She has no wheezes.  Abdominal: Soft. Bowel sounds are normal. There is no tenderness.  Musculoskeletal: She exhibits no edema or tenderness.  Increased pain with going from sitting position to lying down and from lying down to sitting position.  Increased pain with twisting motion, etc.    Lymphadenopathy:    She has no cervical adenopathy.  Skin: No rash noted. No erythema.  Psychiatric: She has a normal mood and affect. Her behavior is normal.    BP 132/84   Pulse 74   Temp 98.5 F (36.9 C) (Oral)   Resp 18   Wt (!) 325 lb 9.6 oz (147.7 kg)   LMP 06/11/2003   SpO2 98%   BMI 55.89 kg/m  Wt Readings from Last 3  Encounters:  05/21/17 (!) 325 lb 9.6 oz (147.7 kg)  05/13/17 300 lb (136.1 kg)  12/07/16 (!) 318 lb (144.2 kg)     Lab Results  Component Value Date   WBC 7.0 12/05/2016   HGB 13.2 12/05/2016   HCT 39.6 12/05/2016   PLT 269 12/05/2016   GLUCOSE 86 12/05/2016   CHOL 162 12/05/2016   TRIG 133 12/05/2016   HDL 52 12/05/2016   LDLCALC 83 12/05/2016  ALT 19 03/02/2017   AST 23 03/02/2017   NA 142 12/05/2016   K 4.8 12/05/2016   CL 101 12/05/2016   CREATININE 0.84 12/05/2016   BUN 13 12/05/2016   CO2 28 12/05/2016   TSH 4.400 02/23/2017   HGBA1C 5.3 03/19/2015    Dg Lumbar Spine 2-3 Views  Result Date: 05/13/2017 CLINICAL DATA:  Twisting injury last night, pain now lower back around to lower anterior abd EXAM: LUMBAR SPINE - 2-3 VIEW COMPARISON:  None. FINDINGS: No fracture.  No spondylolisthesis.  No bone lesion. Mild loss of disc height at L4-L5. Minor loss of disc height at L2-L3. Remaining discs are well preserved in height. Mild facet degenerative change noted L5-S1. Slight curvature, convex the left, apex at L3. A well positioned lap band is stable from study dated 03/04/2012. Soft tissues are otherwise unremarkable. IMPRESSION: 1. No fracture, spondylolisthesis or acute finding. 2. Mild degenerative changes. Electronically Signed   By: Lajean Manes M.D.   On: 05/13/2017 10:45       Assessment & Plan:   Problem List Items Addressed This Visit    Back pain    Back pain s/p slip in tub.  Has improved some.  Refilled flexeril.  Will continue gentle use of antiinflammatories.  Follow.        Relevant Medications   cyclobenzaprine (FLEXERIL) 10 MG tablet   BMI 50.0-59.9, adult ([facility])    Diet and exercise.  Discussed weight gain.  Plans to start exercising more once pain improved.  Follow.        GERD (gastroesophageal reflux disease)    On protonix.  Symptoms doing well.        Health care maintenance    Physical today 05/21/17.  S/p hysterectomy.  Mammogram 05/01/16 -  birads 0.  05/15/16 - birads I.  Recommended f/u bilateral screening mammogram in one year.        Hypercholesterolemia    On lipitor.  Low cholesterol diet and exercise.  Follow lipid panel and liver function tests.        Relevant Orders   Hepatic function panel   Lipid panel   Hypertension    Blood pressure on recheck improved.  Continue same medication regimen.  Follow pressures.  Follow metabolic panel.       Relevant Orders   TSH   Basic metabolic panel   Subacute cutaneous lupus erythematosus    Has seen dermatology.  Minimal rash recurred.  F/u with dermatology as needed.         Other Visit Diagnoses    Breast cancer screening    -  Primary   Relevant Orders   MM DIGITAL SCREENING BILATERAL   Vitamin D deficiency       Relevant Orders   VITAMIN D 25 Hydroxy (Vit-D Deficiency, Fractures)       Einar Pheasant, MD

## 2017-05-21 NOTE — Assessment & Plan Note (Signed)
Physical today 05/21/17.  S/p hysterectomy.  Mammogram 05/01/16 - birads 0.  05/15/16 - birads I.  Recommended f/u bilateral screening mammogram in one year.

## 2017-05-24 ENCOUNTER — Encounter: Payer: Self-pay | Admitting: Internal Medicine

## 2017-05-24 DIAGNOSIS — M549 Dorsalgia, unspecified: Secondary | ICD-10-CM | POA: Insufficient documentation

## 2017-05-24 NOTE — Assessment & Plan Note (Signed)
Has seen dermatology.  Minimal rash recurred.  F/u with dermatology as needed.

## 2017-05-24 NOTE — Assessment & Plan Note (Signed)
Back pain s/p slip in tub.  Has improved some.  Refilled flexeril.  Will continue gentle use of antiinflammatories.  Follow.

## 2017-05-24 NOTE — Assessment & Plan Note (Signed)
Diet and exercise.  Discussed weight gain.  Plans to start exercising more once pain improved.  Follow.

## 2017-05-24 NOTE — Assessment & Plan Note (Signed)
On protonix.  Symptoms doing well.

## 2017-05-24 NOTE — Assessment & Plan Note (Signed)
On lipitor.  Low cholesterol diet and exercise.  Follow lipid panel and liver function tests.   

## 2017-05-24 NOTE — Assessment & Plan Note (Signed)
Blood pressure on recheck improved.  Continue same medication regimen.  Follow pressures.  Follow metabolic panel.   

## 2017-05-29 ENCOUNTER — Encounter: Payer: Self-pay | Admitting: Internal Medicine

## 2017-05-30 ENCOUNTER — Encounter: Payer: Self-pay | Admitting: Internal Medicine

## 2017-05-30 DIAGNOSIS — M549 Dorsalgia, unspecified: Secondary | ICD-10-CM

## 2017-05-30 NOTE — Telephone Encounter (Signed)
Please call and confirm with pt she is wanting physical therapy for her back.  Confirm no worsening symptoms.  Does she have a preference of which therapy group?

## 2017-05-31 NOTE — Telephone Encounter (Signed)
Patient is going to [facility] and see if there is a specific place she needs to go.

## 2017-05-31 NOTE — Telephone Encounter (Signed)
See other MyChart encounter.

## 2017-05-31 NOTE — Telephone Encounter (Signed)
Order placed for referral to physical therapy.  

## 2017-06-01 ENCOUNTER — Ambulatory Visit
Admission: RE | Admit: 2017-06-01 | Discharge: 2017-06-01 | Disposition: A | Discharge status: Home / Self Care | Payer: Managed Care, Other (non HMO) | Source: Ambulatory Visit | Attending: Internal Medicine | Admitting: Internal Medicine

## 2017-06-01 DIAGNOSIS — Z1231 Encounter for screening mammogram for malignant neoplasm of breast: Secondary | ICD-10-CM | POA: Insufficient documentation

## 2017-06-01 DIAGNOSIS — Z1239 Encounter for other screening for malignant neoplasm of breast: Secondary | ICD-10-CM

## 2017-06-13 LAB — LIPID PANEL
Chol/HDL Ratio: 3.2 ratio (ref 0.0–4.4)
Cholesterol, Total: 167 mg/dL (ref 100–199)
HDL: 52 mg/dL (ref >39)
LDL Calculated: 95 mg/dL (ref 0–99)
Triglycerides: 99 mg/dL (ref 0–149)
VLDL CHOLESTEROL CAL: 20 mg/dL (ref 5–40)

## 2017-06-13 LAB — BASIC METABOLIC PANEL
BUN/Creatinine Ratio: 18 (ref 9–23)
BUN: 17 mg/dL (ref 6–24)
CO2: 26 mmol/L (ref 20–29)
Calcium: 9.3 mg/dL (ref 8.7–10.2)
Chloride: 103 mmol/L (ref 96–106)
Creatinine, Ser: 0.93 mg/dL (ref 0.57–1.00)
GFR, EST AFRICAN AMERICAN: 82 mL/min/{1.73_m2} (ref >59)
GFR, EST NON AFRICAN AMERICAN: 71 mL/min/{1.73_m2} (ref >59)
Glucose: 95 mg/dL (ref 65–99)
POTASSIUM: 4.7 mmol/L (ref 3.5–5.2)
SODIUM: 142 mmol/L (ref 134–144)

## 2017-06-13 LAB — VITAMIN D 25 HYDROXY (VIT D DEFICIENCY, FRACTURES): Vit D, 25-Hydroxy: 13.8 ng/mL — ABNORMAL LOW (ref 30.0–100.0)

## 2017-06-13 LAB — HEPATIC FUNCTION PANEL
ALBUMIN: 4.1 g/dL (ref 3.5–5.5)
ALT: 13 IU/L (ref 0–32)
AST: 14 IU/L (ref 0–40)
Alkaline Phosphatase: 97 IU/L (ref 39–117)
BILIRUBIN TOTAL: 0.3 mg/dL (ref 0.0–1.2)
Bilirubin, Direct: 0.09 mg/dL (ref 0.00–0.40)
TOTAL PROTEIN: 6.6 g/dL (ref 6.0–8.5)

## 2017-06-13 LAB — TSH: TSH: 3.43 u[IU]/mL (ref 0.450–4.500)

## 2017-06-14 ENCOUNTER — Encounter: Payer: Self-pay | Admitting: Internal Medicine

## 2017-06-14 MED ORDER — VITAMIN D (ERGOCALCIFEROL) 1.25 MG (50000 UNIT) PO CAPS: 50000.0000 [IU] | ORAL_CAPSULE | ORAL | 0 refills | Status: DC

## 2017-06-14 NOTE — Telephone Encounter (Signed)
rx sent in for ergocalciferol #12 with no refills.

## 2017-07-27 ENCOUNTER — Ambulatory Visit: Payer: Managed Care, Other (non HMO) | Admitting: Internal Medicine

## 2017-08-03 ENCOUNTER — Other Ambulatory Visit: Payer: Self-pay | Admitting: Internal Medicine

## 2017-08-08 ENCOUNTER — Other Ambulatory Visit: Payer: Self-pay | Admitting: Internal Medicine

## 2017-10-20 ENCOUNTER — Other Ambulatory Visit: Payer: Self-pay | Admitting: Internal Medicine

## 2017-10-22 NOTE — Telephone Encounter (Signed)
Copied from [place] [id number]. Topic: Appointment Scheduling - Scheduling Inquiry for Clinic >> Oct 22, 2017  9:13 AM Kristine Hendricks wrote: Reason for CRM: Had to cancel appt on 10/23/17, requesting to be worked- in in Sept? Please advise?

## 2017-10-23 ENCOUNTER — Ambulatory Visit: Payer: Managed Care, Other (non HMO) | Admitting: Internal Medicine

## 2017-10-23 ENCOUNTER — Encounter

## 2017-11-20 ENCOUNTER — Encounter: Payer: Self-pay | Admitting: Internal Medicine

## 2017-11-20 NOTE — Telephone Encounter (Signed)
See attached.  Needs appt.  Confirm no acute issues.

## 2017-11-20 NOTE — Telephone Encounter (Signed)
With these issues, needs to be evaluated.  Confirm no acute issues.

## 2017-11-20 NOTE — Telephone Encounter (Signed)
I have left her a message to schedule her an appt in Sept. I do not remember having a message on her about being scheduled this month.

## 2017-11-21 ENCOUNTER — Telehealth: Payer: Self-pay | Admitting: Internal Medicine

## 2017-11-21 NOTE — Telephone Encounter (Signed)
Copied from [place] [id number]. Topic: General - Other >> Nov 20, 2017  4:42 PM Yvette Rack wrote: Reason for CRM: Pt returned call to office. Pt states she would like to schedule an appt but she can not wait til December. Pt states she will not be available week of 12/03/17. Cb# [phone]

## 2017-11-21 NOTE — Telephone Encounter (Signed)
PT dropped off EKG and lab results for Dr. Nicki Reaper. Papers are up front in color folder.

## 2017-11-21 NOTE — Telephone Encounter (Signed)
Pt scheduled for Friday at 12

## 2017-11-22 NOTE — Telephone Encounter (Signed)
Noted  

## 2017-11-22 NOTE — Telephone Encounter (Signed)
Placed in your quick sign for your review. We will have to complete form at her appt tomorrow.

## 2017-11-23 ENCOUNTER — Ambulatory Visit: Payer: Managed Care, Other (non HMO) | Admitting: Internal Medicine

## 2017-11-23 VITALS — BP 140/88 | HR 58 | Temp 98.2°F | Resp 18 | Wt 327.0 lb

## 2017-11-23 DIAGNOSIS — E7800 Pure hypercholesterolemia, unspecified: Secondary | ICD-10-CM | POA: Diagnosis not present

## 2017-11-23 DIAGNOSIS — K2100 Gastro-esophageal reflux disease with esophagitis, without bleeding: Secondary | ICD-10-CM

## 2017-11-23 DIAGNOSIS — R000 Tachycardia, unspecified: Secondary | ICD-10-CM

## 2017-11-23 DIAGNOSIS — R21 Rash and other nonspecific skin eruption: Secondary | ICD-10-CM

## 2017-11-23 DIAGNOSIS — Z23 Encounter for immunization: Secondary | ICD-10-CM

## 2017-11-23 DIAGNOSIS — R7989 Other specified abnormal findings of blood chemistry: Secondary | ICD-10-CM

## 2017-11-23 DIAGNOSIS — I10 Essential (primary) hypertension: Secondary | ICD-10-CM

## 2017-11-23 NOTE — Progress Notes (Signed)
Patient ID: Kristine Hendricks, female   DOB: [birth year], 52 y.o.   MRN: [id number]   Subjective:    Patient ID: Kristine Hendricks, female    DOB: 10/25/1965, 52 y.o.   MRN: [id number]  HPI  Patient here as a work in to discuss some recent lab work, EKG and some concerns she has noticed.  She is having persistent intermittent rash.  Started on her anterior chest and now also involves her upper arms and back.  Seeing Dr Phillip Heal.  Using a cream she prescribed which helps the anterior chest rash.  Persistent rash upper arms and upper back.  States was told was allergic reaction to the sun.  Also has noticed hoarseness.  No acid reflux.  Some soreness right side of her neck.  Also reports eyes dry and some weight gain.  States that two weeks ago she had episode of rapid heart bett.  Lasted for approximately one minute.  Some intermittent dizziness.  No headache.  No abdominal pain.  Bowels moving.  Had recent work up at [facility].  Had labs.  TSH elevated.  EKG - SR with premature suprventricular complexes.  Discussed labs.  Discussed EKG.    Past Medical History:  Diagnosis Date  . Anemia   . Asthma   . GERD (gastroesophageal reflux disease)   . History of chicken pox   . Hypercholesterolemia   . Hypertension   . Seasonal allergies   . Sleep apnea    Past Surgical History:  Procedure Laterality Date  . arthroscopic surgery     Dr Sabra Heck  . BREAST CYST ASPIRATION Right 2005  . BREAST SURGERY  4 /05   biopsy  . CHOLECYSTECTOMY  2007  . COLONOSCOPY WITH PROPOFOL N/A 02/14/2016   Procedure: COLONOSCOPY WITH PROPOFOL;  Surgeon: Manya Silvas, MD;  Location: [facility] ENDOSCOPY;  Service: Endoscopy;  Laterality: N/A;  . DILATION AND CURETTAGE OF UTERUS  8/04  . LAPAROSCOPIC GASTRIC BANDING  01/29/09  . MYOMECTOMY  9/04  . VAGINAL HYSTERECTOMY  06/11/03   Family History  Problem Relation Age of Onset  . Lung cancer Mother   . Arthritis Mother   . Stroke Mother   . Hypertension Mother   .  Colon polyps Mother   . Thyroid disease Sister        graves disease  . Colon cancer Unknown        maternal grandparent  . Hyperlipidemia Father   . Stroke Maternal Aunt   . Stroke Maternal Uncle   . Cancer Maternal Grandmother        colon  . Alcohol abuse Cousin   . Breast cancer Neg Hx    Social History   Socioeconomic History  . Marital status: Single    Spouse name: Not on file  . Number of children: 0  . Years of education: Not on file  . Highest education level: Not on file  Occupational History    Employer: [employer]  . Financial resource strain: Not on file  . Food insecurity:    Worry: Not on file    Inability: Not on file  . Transportation needs:    Medical: Not on file    Non-medical: Not on file  Tobacco Use  . Smoking status: Never Smoker  . Smokeless tobacco: Never Used  Substance and Sexual Activity  . Alcohol use: No    Alcohol/week: 0.0 standard drinks    Comment: occasional  . Drug use:  No  . Sexual activity: Not on file  Lifestyle  . Physical activity:    Days per week: Not on file    Minutes per session: Not on file  . Stress: Not on file  Relationships  . Social connections:    Talks on phone: Not on file    Gets together: Not on file    Attends religious service: Not on file    Active member of club or organization: Not on file    Attends meetings of clubs or organizations: Not on file    Relationship status: Not on file  Other Topics Concern  . Not on file  Social History Narrative  . Not on file    Outpatient Encounter Medications as of 11/23/2017  Medication Sig  . atorvastatin (LIPITOR) 10 MG tablet TAKE 1 TABLET BY MOUTH  DAILY  . CALCIUM PO Take 300 mg by mouth 4 (four) times daily.   Marland Kitchen losartan (COZAAR) 100 MG tablet TAKE 1 TABLET BY MOUTH  DAILY  . Multiple Vitamin (MULTIVITAMIN PO) Take by mouth daily.    . pantoprazole (PROTONIX) 40 MG tablet TAKE 1 TABLET BY MOUTH  DAILY  . venlafaxine XR (EFFEXOR-XR) 37.5  MG 24 hr capsule TAKE 1 CAPSULE BY MOUTH  DAILY WITH BREAKFAST  . [DISCONTINUED] cyclobenzaprine (FLEXERIL) 10 MG tablet Take 1 tablet (10 mg total) by mouth 3 (three) times daily as needed for muscle spasms.  . [DISCONTINUED] fluticasone (FLONASE) 50 MCG/ACT nasal spray Place into the nose.  . [DISCONTINUED] HYDROcodone-acetaminophen (NORCO/VICODIN) 5-325 MG tablet Take 1 tablet by mouth every 6 (six) hours as needed for moderate pain. (Patient not taking: Reported on 05/21/2017)  . [DISCONTINUED] Vitamin D, Ergocalciferol, (DRISDOL) 50000 units CAPS capsule Take 1 capsule (50,000 Units total) by mouth every 7 (seven) days.   No facility-administered encounter medications on file as of 11/23/2017.     Review of Systems  Constitutional: Negative for appetite change.       Weight gain as outlined.    HENT: Negative for congestion and sinus pressure.   Respiratory: Negative for cough, chest tightness and shortness of breath.   Cardiovascular: Negative for chest pain and leg swelling.       Episode of increased heart rate as outlined.    Gastrointestinal: Negative for abdominal pain, diarrhea, nausea and vomiting.  Genitourinary: Negative for difficulty urinating and dysuria.  Musculoskeletal: Negative for joint swelling and myalgias.  Skin: Positive for rash. Negative for wound.  Neurological: Positive for dizziness. Negative for headaches.  Psychiatric/Behavioral: Negative for agitation and dysphoric mood.       Objective:    Physical Exam  Constitutional: She appears well-developed and well-nourished. No distress.  HENT:  Nose: Nose normal.  Mouth/Throat: Oropharynx is clear and moist.  Neck: Neck supple. No thyromegaly present.  Cardiovascular: Normal rate and regular rhythm.  Pulmonary/Chest: Breath sounds normal. No respiratory distress. She has no wheezes.  Abdominal: Soft. Bowel sounds are normal. There is no tenderness.  Musculoskeletal: She exhibits no edema or tenderness.    Lymphadenopathy:    She has no cervical adenopathy.  Skin: No rash noted. No erythema.  Psychiatric: She has a normal mood and affect. Her behavior is normal.    BP 140/88 (BP Location: Left Arm, Patient Position: Sitting, Cuff Size: Large)   Pulse (!) 58   Temp 98.2 F (36.8 C) (Oral)   Resp 18   Wt (!) 327 lb (148.3 kg)   LMP 06/11/2003   SpO2 98%  BMI 56.13 kg/m  Wt Readings from Last 3 Encounters:  11/23/17 (!) 327 lb (148.3 kg)  05/21/17 (!) 325 lb 9.6 oz (147.7 kg)  05/13/17 300 lb (136.1 kg)     Lab Results  Component Value Date   WBC 7.0 12/05/2016   HGB 13.2 12/05/2016   HCT 39.6 12/05/2016   PLT 269 12/05/2016   GLUCOSE 95 06/12/2017   CHOL 167 06/12/2017   TRIG 99 06/12/2017   HDL 52 06/12/2017   LDLCALC 95 06/12/2017   ALT 13 06/12/2017   AST 14 06/12/2017   NA 142 06/12/2017   K 4.7 06/12/2017   CL 103 06/12/2017   CREATININE 0.93 06/12/2017   BUN 17 06/12/2017   CO2 26 06/12/2017   TSH 7.210 (H) 11/23/2017   HGBA1C 5.3 03/19/2015    Mm Screening Breast Tomo Bilateral  Result Date: 06/04/2017 CLINICAL DATA:  Screening. EXAM: DIGITAL SCREENING BILATERAL MAMMOGRAM WITH TOMO AND CAD COMPARISON:  Previous exam(s). ACR Breast Density Category a: The breast tissue is almost entirely fatty. FINDINGS: There are no findings suspicious for malignancy. Images were processed with CAD. IMPRESSION: No mammographic evidence of malignancy. A result letter of this screening mammogram will be mailed directly to the patient. RECOMMENDATION: Screening mammogram in one year. (Code:SM-B-01Y) BI-RADS CATEGORY  1: Negative. Electronically Signed   By: Nolon Nations M.D.   On: 06/04/2017 08:48       Assessment & Plan:   Problem List Items Addressed This Visit    Elevated TSH - Primary    Elevation in tsh.  Discussed with her today.  She request to have tsh repeated prior to treatment.        Relevant Orders   TSH (Completed)   T4, free (Completed)   T3, free  (Completed)   GERD (gastroesophageal reflux disease)    Controlled on protonix.        Hypercholesterolemia    On lipitor.  Low cholesterol diet and exercise.  Follow lipid panel and liver function tests.        Hypertension    Blood pressure as outlined.  Continue same medication regimen.  Follow pressures.  Follow metabolic panel.        Rapid heart beat    Had the episode of rapid heart beat as outlined.  Intermittent dizziness.  EKG as outlined.  Discussed further w/up.  Refer to cardiology for further evaluation and w/up (i.e., need for holter, etc).        Relevant Orders   Ambulatory referral to Cardiology   Rash    Persistent.  Seeing dermatology.         Other Visit Diagnoses    Need for immunization against influenza       Relevant Orders   Flu Vaccine QUAD 36+ mos IM (Completed)       Einar Pheasant, MD

## 2017-11-24 LAB — T4, FREE: FREE T4: 1.01 ng/dL (ref 0.82–1.77)

## 2017-11-24 LAB — T3, FREE: T3, Free: 2.6 pg/mL (ref 2.0–4.4)

## 2017-11-24 LAB — TSH: TSH: 7.21 u[IU]/mL — AB (ref 0.450–4.500)

## 2017-11-25 ENCOUNTER — Encounter: Payer: Self-pay | Admitting: Internal Medicine

## 2017-11-25 DIAGNOSIS — R000 Tachycardia, unspecified: Secondary | ICD-10-CM | POA: Insufficient documentation

## 2017-11-25 NOTE — Assessment & Plan Note (Signed)
Blood pressure as outlined.  Continue same medication regimen.  Follow pressures.  Follow metabolic panel.  

## 2017-11-25 NOTE — Assessment & Plan Note (Signed)
Elevation in tsh.  Discussed with her today.  She request to have tsh repeated prior to treatment.

## 2017-11-25 NOTE — Assessment & Plan Note (Signed)
Controlled on protonix.   

## 2017-11-25 NOTE — Assessment & Plan Note (Signed)
Persistent.  Seeing dermatology.

## 2017-11-25 NOTE — Assessment & Plan Note (Signed)
Had the episode of rapid heart beat as outlined.  Intermittent dizziness.  EKG as outlined.  Discussed further w/up.  Refer to cardiology for further evaluation and w/up (i.e., need for holter, etc).

## 2017-11-25 NOTE — Assessment & Plan Note (Signed)
On lipitor.  Low cholesterol diet and exercise.  Follow lipid panel and liver function tests.   

## 2017-11-26 ENCOUNTER — Other Ambulatory Visit: Payer: Self-pay

## 2017-11-26 ENCOUNTER — Telehealth: Payer: Self-pay | Admitting: Internal Medicine

## 2017-11-26 DIAGNOSIS — E039 Hypothyroidism, unspecified: Secondary | ICD-10-CM

## 2017-11-26 MED ORDER — LEVOTHYROXINE SODIUM 50 MCG PO TABS: 50.0000 ug | ORAL_TABLET | Freq: Every day | ORAL | 1 refills | Status: DC

## 2017-11-26 NOTE — Telephone Encounter (Signed)
Order placed for f/u tsh to be drawn at [facility].  

## 2017-12-11 NOTE — Progress Notes (Signed)
New Outpatient Visit Date: 12/12/2017  Referring Provider: Einar Pheasant, [address] [place], [place] [address]  Chief Complaint: Abnormal EKG  HPI:  Kristine Hendricks is a 52 y.o. female who is being seen today for the evaluation of palpitations at the request of Dr. Nicki Reaper. She has a history of hypertension, hypercholesterolemia, asthma, anemia, GERD, morbid obesity, and sleep apnea.  She reported an episode of a rapid heartbeat in late August that lasted about 1 minute.  She also noted intermittent dizziness.  EKG showed sinus rhythm with premature supraventricular complexes.  Today, Kristine Hendricks reports that she has been feeling well.  Other than the single episode of palpitations in late August, she has not had any further palpitations.  She also denies chest pain, shortness of breath, lightheadedness, orthopnea, PND, and edema.  She walks regularly and also goes to the gym on occasion.  She reports undergoing a stress test and echo at least 10 years ago and believes that these were both normal.  She has been diagnosed with sleep apnea in the past but does not use CPAP.  She drinks 1 coffee and iced tea per day.  --------------------------------------------------------------------------------------------------  Cardiovascular History & Procedures: Cardiovascular Problems:  PACs  Risk Factors:  Hypertension, hyperlipidemia, and morbid obesity  Cath/PCI:  None  CV Surgery:  None  EP Procedures and Devices:  None  Non-Invasive Evaluation(s):  Bilateral lower extremity venous duplex (08/26/2008): Normal study without evidence of DVT.  Recent CV Pertinent Labs: Lab Results  Component Value Date   CHOL 167 06/12/2017   HDL 52 06/12/2017   LDLCALC 95 06/12/2017   TRIG 99 06/12/2017   CHOLHDL 3.2 06/12/2017   K 5.1 12/12/2017   MG 2.1 12/12/2017   BUN 15 12/12/2017   CREATININE 0.70 12/12/2017    --------------------------------------------------------------------------------------------------  Past Medical History:  Diagnosis Date  . Anemia   . Asthma   . GERD (gastroesophageal reflux disease)   . History of chicken pox   . Hypercholesterolemia   . Hypertension   . Hypothyroidism   . Seasonal allergies   . Sleep apnea     Past Surgical History:  Procedure Laterality Date  . arthroscopic surgery     Dr Sabra Heck  . BREAST CYST ASPIRATION Right 2005  . BREAST SURGERY  4 /05   biopsy  . CHOLECYSTECTOMY  2007  . COLONOSCOPY WITH PROPOFOL N/A 02/14/2016   Procedure: COLONOSCOPY WITH PROPOFOL;  Surgeon: Manya Silvas, MD;  Location: [facility] ENDOSCOPY;  Service: Endoscopy;  Laterality: N/A;  . DILATION AND CURETTAGE OF UTERUS  8/04  . LAPAROSCOPIC GASTRIC BANDING  01/29/09  . MYOMECTOMY  9/04  . VAGINAL HYSTERECTOMY  06/11/03    Current Meds  Medication Sig  . atorvastatin (LIPITOR) 10 MG tablet TAKE 1 TABLET BY MOUTH  DAILY  . CALCIUM PO Take 300 mg by mouth 4 (four) times daily.   Marland Kitchen levothyroxine (SYNTHROID, LEVOTHROID) 50 MCG tablet Take 1 tablet (50 mcg total) by mouth daily.  Marland Kitchen losartan (COZAAR) 100 MG tablet TAKE 1 TABLET BY MOUTH  DAILY  . Multiple Vitamin (MULTIVITAMIN PO) Take by mouth daily.    . pantoprazole (PROTONIX) 40 MG tablet TAKE 1 TABLET BY MOUTH  DAILY  . venlafaxine XR (EFFEXOR-XR) 37.5 MG 24 hr capsule TAKE 1 CAPSULE BY MOUTH  DAILY WITH BREAKFAST    Allergies: Sulfa antibiotics; Sulfasalazine; and Codeine  Social History   Tobacco Use  . Smoking status: Never Smoker  . Smokeless tobacco: Never  Used  Substance Use Topics  . Alcohol use: No    Alcohol/week: 0.0 standard drinks    Comment: 1 drink/month  . Drug use: No    Family History  Problem Relation Age of Onset  . Lung cancer Mother   . Arthritis Mother   . Stroke Mother   . Hypertension Mother   . Colon polyps Mother   . Thyroid disease Sister        graves disease  . Colon  cancer Unknown        maternal grandparent  . Hyperlipidemia Father   . Stroke Maternal Aunt   . Stroke Maternal Uncle   . Cancer Maternal Grandmother        colon  . Alcohol abuse Cousin   . Breast cancer Neg Hx   . Heart disease Neg Hx     Review of Systems: A 12-system review of systems was performed and was negative except as noted in the HPI.  --------------------------------------------------------------------------------------------------  Physical Exam: BP (!) 146/92 (BP Location: Right Arm, Patient Position: Sitting, Cuff Size: Large)   Pulse (!) 55   Ht 5\' 4"  (1.626 m)   Wt (!) 326 lb 8 oz (148.1 kg)   LMP 06/11/2003   BMI 56.04 kg/m   General: Morbidly obese woman, seated comfortably in the exam room. HEENT: No conjunctival pallor or scleral icterus. Moist mucous membranes. OP clear. Neck: Supple without lymphadenopathy, thyromegaly, JVD, or HJR. No carotid bruit. Lungs: Normal work of breathing. Clear to auscultation bilaterally without wheezes or crackles. Heart: Bradycardic but regular without murmurs, rubs, or gallops.  Unable to assess PMI due to body habitus. Abd: Bowel sounds present. Soft, NT/ND.  Unable to assess HSM due to body habitus. Ext: No lower extremity edema. Radial, PT, and DP pulses are 2+ bilaterally Skin: Warm and dry without rash. Neuro: CNIII-XII intact. Strength and fine-touch sensation intact in upper and lower extremities bilaterally. Psych: Normal mood and affect.  EKG: Sinus bradycardia (heart rate 55 bpm) with low voltage.  No significant abnormalities.  Lab Results  Component Value Date   WBC 7.0 12/05/2016   HGB 13.2 12/05/2016   HCT 39.6 12/05/2016   MCV 83 12/05/2016   PLT 269 12/05/2016    Lab Results  Component Value Date   NA 142 12/12/2017   K 5.1 12/12/2017   CL 102 12/12/2017   CO2 27 12/12/2017   BUN 15 12/12/2017   CREATININE 0.70 12/12/2017   GLUCOSE 88 12/12/2017   ALT 13 06/12/2017    Lab Results    Component Value Date   CHOL 167 06/12/2017   HDL 52 06/12/2017   LDLCALC 95 06/12/2017   TRIG 99 06/12/2017   CHOLHDL 3.2 06/12/2017     --------------------------------------------------------------------------------------------------  ASSESSMENT AND PLAN: PACs I have reviewed patient's recent EKG earlier this month that showed an atrial couplet.  Otherwise, no arrhythmias or significant abnormalities were identified.  Her EKG today is normal.  She notes only a single episode of brief palpitations without associated worrisome symptoms.  I recommended that we check basic metabolic panel and magnesium level today.  If these are normal, I do not think that further evaluation is indicated.  I have recommended that she minimize her caffeine intake and also consider reevaluation and treatment of sleep apnea, which can predispose to atrial arrhythmias.  Hypertension Blood pressure mildly elevated today.  It was borderline elevated at PCP visit earlier this month.  I have recommended that she continue her current medications  and work on diet and weight loss.  I will defer further management to Dr. Nicki Reaper.  Follow-up: Return to clinic as needed if palpitations recur or new symptoms develop.  Kristine Bush, MD 12/13/2017 2:46 PM

## 2017-12-12 ENCOUNTER — Encounter: Payer: Self-pay | Admitting: Internal Medicine

## 2017-12-12 ENCOUNTER — Ambulatory Visit: Payer: Managed Care, Other (non HMO) | Admitting: Internal Medicine

## 2017-12-12 VITALS — BP 146/92 | HR 55 | Ht 64.0 in | Wt 326.5 lb

## 2017-12-12 DIAGNOSIS — I491 Atrial premature depolarization: Secondary | ICD-10-CM | POA: Diagnosis not present

## 2017-12-12 DIAGNOSIS — R002 Palpitations: Secondary | ICD-10-CM

## 2017-12-12 DIAGNOSIS — I10 Essential (primary) hypertension: Secondary | ICD-10-CM | POA: Diagnosis not present

## 2017-12-12 NOTE — Patient Instructions (Signed)
Medication Instructions:  Your physician recommends that you continue on your current medications as directed. Please refer to the Current Medication list given to you today.   Labwork: Your physician recommends that you return for lab work in: TODAY - BMET, Mg.   Testing/Procedures: none  Follow-Up: Your physician recommends that you schedule a follow-up appointment in: AS NEEDED.    DASH Eating Plan DASH stands for "Dietary Approaches to Stop Hypertension." The DASH eating plan is a healthy eating plan that has been shown to reduce high blood pressure (hypertension). It may also reduce your risk for type 2 diabetes, heart disease, and stroke. The DASH eating plan may also help with weight loss. What are tips for following this plan? General guidelines  Avoid eating more than 2,300 mg (milligrams) of salt (sodium) a day. If you have hypertension, you may need to reduce your sodium intake to 1,500 mg a day.  Limit alcohol intake to no more than 1 drink a day for nonpregnant women and 2 drinks a day for men. One drink equals 12 oz of beer, 5 oz of wine, or 1 oz of hard liquor.  Work with your health care provider to maintain a healthy body weight or to lose weight. Ask what an ideal weight is for you.  Get at least 30 minutes of exercise that causes your heart to beat faster (aerobic exercise) most days of the week. Activities may include walking, swimming, or biking.  Work with your health care provider or diet and nutrition specialist (dietitian) to adjust your eating plan to your individual calorie needs. Reading food labels  Check food labels for the amount of sodium per serving. Choose foods with less than 5 percent of the Daily Value of sodium. Generally, foods with less than 300 mg of sodium per serving fit into this eating plan.  To find whole grains, look for the word "whole" as the first word in the ingredient list. Shopping  Buy products labeled as "low-sodium" or "no  salt added."  Buy fresh foods. Avoid canned foods and premade or frozen meals. Cooking  Avoid adding salt when cooking. Use salt-free seasonings or herbs instead of table salt or sea salt. Check with your health care provider or pharmacist before using salt substitutes.  Do not fry foods. Cook foods using healthy methods such as baking, boiling, grilling, and broiling instead.  Cook with heart-healthy oils, such as olive, canola, soybean, or sunflower oil. Meal planning   Eat a balanced diet that includes: ? 5 or more servings of fruits and vegetables each day. At each meal, try to fill half of your plate with fruits and vegetables. ? Up to 6-8 servings of whole grains each day. ? Less than 6 oz of lean meat, poultry, or fish each day. A 3-oz serving of meat is about the same size as a deck of cards. One egg equals 1 oz. ? 2 servings of low-fat dairy each day. ? A serving of nuts, seeds, or beans 5 times each week. ? Heart-healthy fats. Healthy fats called Omega-3 fatty acids are found in foods such as flaxseeds and coldwater fish, like sardines, salmon, and mackerel.  Limit how much you eat of the following: ? Canned or prepackaged foods. ? Food that is high in trans fat, such as fried foods. ? Food that is high in saturated fat, such as fatty meat. ? Sweets, desserts, sugary drinks, and other foods with added sugar. ? Full-fat dairy products.  Do not salt foods  before eating.  Try to eat at least 2 vegetarian meals each week.  Eat more home-cooked food and less restaurant, buffet, and fast food.  When eating at a restaurant, ask that your food be prepared with less salt or no salt, if possible. What foods are recommended? The items listed may not be a complete list. Talk with your dietitian about what dietary choices are best for you. Grains Whole-grain or whole-wheat bread. Whole-grain or whole-wheat pasta. Brown rice. [place]. Bulgur. Whole-grain and low-sodium  cereals. Pita bread. Low-fat, low-sodium crackers. Whole-wheat flour tortillas. Vegetables Fresh or frozen vegetables (raw, steamed, roasted, or grilled). Low-sodium or reduced-sodium tomato and vegetable juice. Low-sodium or reduced-sodium tomato sauce and tomato paste. Low-sodium or reduced-sodium canned vegetables. Fruits All fresh, dried, or frozen fruit. Canned fruit in natural juice (without added sugar). Meat and other protein foods Skinless chicken or [place]. Ground chicken or [place]. Pork with fat trimmed off. Fish and seafood. Egg whites. Dried beans, peas, or lentils. Unsalted nuts, nut butters, and seeds. Unsalted canned beans. Lean cuts of beef with fat trimmed off. Low-sodium, lean deli meat. Dairy Low-fat (1%) or fat-free (skim) milk. Fat-free, low-fat, or reduced-fat cheeses. Nonfat, low-sodium ricotta or cottage cheese. Low-fat or nonfat yogurt. Low-fat, low-sodium cheese. Fats and oils Soft margarine without trans fats. Vegetable oil. Low-fat, reduced-fat, or light mayonnaise and salad dressings (reduced-sodium). Canola, safflower, olive, soybean, and sunflower oils. Avocado. Seasoning and other foods Herbs. Spices. Seasoning mixes without salt. Unsalted popcorn and pretzels. Fat-free sweets. What foods are not recommended? The items listed may not be a complete list. Talk with your dietitian about what dietary choices are best for you. Grains Baked goods made with fat, such as croissants, muffins, or some breads. Dry pasta or rice meal packs. Vegetables Creamed or fried vegetables. Vegetables in a cheese sauce. Regular canned vegetables (not low-sodium or reduced-sodium). Regular canned tomato sauce and paste (not low-sodium or reduced-sodium). Regular tomato and vegetable juice (not low-sodium or reduced-sodium). Angie Fava. Olives. Fruits Canned fruit in a light or heavy syrup. Fried fruit. Fruit in cream or butter sauce. Meat and other protein foods Fatty cuts of meat. Ribs.  Fried meat. Berniece Salines. Sausage. Bologna and other processed lunch meats. Salami. Fatback. Hotdogs. Bratwurst. Salted nuts and seeds. Canned beans with added salt. Canned or smoked fish. Whole eggs or egg yolks. Chicken or [place] with skin. Dairy Whole or 2% milk, cream, and half-and-half. Whole or full-fat cream cheese. Whole-fat or sweetened yogurt. Full-fat cheese. Nondairy creamers. Whipped toppings. Processed cheese and cheese spreads. Fats and oils Butter. Stick margarine. Lard. Shortening. Ghee. Bacon fat. Tropical oils, such as coconut, palm kernel, or palm oil. Seasoning and other foods Salted popcorn and pretzels. Onion salt, garlic salt, seasoned salt, table salt, and sea salt. Worcestershire sauce. Tartar sauce. Barbecue sauce. Teriyaki sauce. Soy sauce, including reduced-sodium. Steak sauce. Canned and packaged gravies. Fish sauce. Oyster sauce. Cocktail sauce. Horseradish that you find on the shelf. Ketchup. Mustard. Meat flavorings and tenderizers. Bouillon cubes. Hot sauce and Tabasco sauce. Premade or packaged marinades. Premade or packaged taco seasonings. Relishes. Regular salad dressings. Where to find more information:  National Heart, Lung, and [place]: https://wilson-eaton.com/  American Heart Association: www.heart.org Summary  The DASH eating plan is a healthy eating plan that has been shown to reduce high blood pressure (hypertension). It may also reduce your risk for type 2 diabetes, heart disease, and stroke.  With the DASH eating plan, you should limit salt (sodium) intake to 2,300 mg  a day. If you have hypertension, you may need to reduce your sodium intake to 1,500 mg a day.  When on the DASH eating plan, aim to eat more fresh fruits and vegetables, whole grains, lean proteins, low-fat dairy, and heart-healthy fats.  Work with your health care provider or diet and nutrition specialist (dietitian) to adjust your eating plan to your individual calorie needs. This  information is not intended to replace advice given to you by your health care provider. Make sure you discuss any questions you have with your health care provider. Document Released: 02/23/2011 Document Revised: 02/28/2016 Document Reviewed: 02/28/2016 Elsevier Interactive Patient Education  [employer].

## 2017-12-13 ENCOUNTER — Encounter: Payer: Self-pay | Admitting: Internal Medicine

## 2017-12-13 DIAGNOSIS — I491 Atrial premature depolarization: Secondary | ICD-10-CM | POA: Insufficient documentation

## 2017-12-13 DIAGNOSIS — R002 Palpitations: Secondary | ICD-10-CM | POA: Insufficient documentation

## 2017-12-13 LAB — BASIC METABOLIC PANEL
BUN/Creatinine Ratio: 21 (ref 9–23)
BUN: 15 mg/dL (ref 6–24)
CALCIUM: 9.5 mg/dL (ref 8.7–10.2)
CO2: 27 mmol/L (ref 20–29)
CREATININE: 0.7 mg/dL (ref 0.57–1.00)
Chloride: 102 mmol/L (ref 96–106)
GFR calc Af Amer: 114 mL/min/{1.73_m2} (ref >59)
GFR, EST NON AFRICAN AMERICAN: 99 mL/min/{1.73_m2} (ref >59)
Glucose: 88 mg/dL (ref 65–99)
POTASSIUM: 5.1 mmol/L (ref 3.5–5.2)
Sodium: 142 mmol/L (ref 134–144)

## 2017-12-13 LAB — MAGNESIUM: Magnesium: 2.1 mg/dL (ref 1.6–2.3)

## 2017-12-24 ENCOUNTER — Other Ambulatory Visit: Payer: Self-pay | Admitting: Internal Medicine

## 2017-12-27 ENCOUNTER — Other Ambulatory Visit: Payer: Self-pay | Admitting: Internal Medicine

## 2018-01-26 ENCOUNTER — Encounter: Payer: Self-pay | Admitting: Internal Medicine

## 2018-01-26 LAB — TSH: TSH: 3.7 u[IU]/mL (ref 0.450–4.500)

## 2018-01-29 ENCOUNTER — Encounter: Payer: Self-pay | Admitting: Internal Medicine

## 2018-01-29 ENCOUNTER — Ambulatory Visit: Payer: Managed Care, Other (non HMO) | Admitting: Internal Medicine

## 2018-01-29 DIAGNOSIS — E7800 Pure hypercholesterolemia, unspecified: Secondary | ICD-10-CM

## 2018-01-29 DIAGNOSIS — I10 Essential (primary) hypertension: Secondary | ICD-10-CM

## 2018-01-29 DIAGNOSIS — H938X9 Other specified disorders of ear, unspecified ear: Secondary | ICD-10-CM | POA: Diagnosis not present

## 2018-01-29 DIAGNOSIS — K2100 Gastro-esophageal reflux disease with esophagitis, without bleeding: Secondary | ICD-10-CM

## 2018-01-29 NOTE — Progress Notes (Signed)
Patient ID: Kristine Hendricks, female   DOB: Apr 25, 1965, 52 y.o.   MRN: [id number]   Subjective:    Patient ID: Kristine Hendricks, female    DOB: Sep 10, 1965, 52 y.o.   MRN: [id number]  HPI  Patient here for a scheduled follow up.  She has been traveling a lot lately - both for vacation and her work.  Has noticed recently increased ear pressure and fullness.  Had developed vertigo.  Some sinus congestion.  No fever.  Has been doing a lot more walking lately.  Since being back, has adjusted her diet.  Drinking more water.  Has lost 10 pounds.  No chest pain.  No sob.  No chest congestion.  No acid reflux.  No abdominal pain.  Bowels moving.  Main complaint is that of her ears/head.     Past Medical History:  Diagnosis Date  . Anemia   . Asthma   . GERD (gastroesophageal reflux disease)   . History of chicken pox   . Hypercholesterolemia   . Hypertension   . Hypothyroidism   . Seasonal allergies   . Sleep apnea    Past Surgical History:  Procedure Laterality Date  . arthroscopic surgery     Dr Sabra Heck  . BREAST CYST ASPIRATION Right 2005  . BREAST SURGERY  4 /05   biopsy  . CHOLECYSTECTOMY  2007  . COLONOSCOPY WITH PROPOFOL N/A 02/14/2016   Procedure: COLONOSCOPY WITH PROPOFOL;  Surgeon: Manya Silvas, MD;  Location: [facility] ENDOSCOPY;  Service: Endoscopy;  Laterality: N/A;  . DILATION AND CURETTAGE OF UTERUS  8/04  . LAPAROSCOPIC GASTRIC BANDING  01/29/09  . MYOMECTOMY  9/04  . VAGINAL HYSTERECTOMY  06/11/03   Family History  Problem Relation Age of Onset  . Lung cancer Mother   . Arthritis Mother   . Stroke Mother   . Hypertension Mother   . Colon polyps Mother   . Thyroid disease Sister        graves disease  . Colon cancer Unknown        maternal grandparent  . Hyperlipidemia Father   . Stroke Maternal Aunt   . Stroke Maternal Uncle   . Cancer Maternal Grandmother        colon  . Alcohol abuse Cousin   . Breast cancer Neg Hx   . Heart disease Neg Hx    Social  History   Socioeconomic History  . Marital status: Single    Spouse name: Not on file  . Number of children: 0  . Years of education: Not on file  . Highest education level: Not on file  Occupational History    Employer: [employer]  . Financial resource strain: Not on file  . Food insecurity:    Worry: Not on file    Inability: Not on file  . Transportation needs:    Medical: Not on file    Non-medical: Not on file  Tobacco Use  . Smoking status: Never Smoker  . Smokeless tobacco: Never Used  Substance and Sexual Activity  . Alcohol use: No    Alcohol/week: 0.0 standard drinks    Comment: 1 drink/month  . Drug use: No  . Sexual activity: Not on file  Lifestyle  . Physical activity:    Days per week: Not on file    Minutes per session: Not on file  . Stress: Not on file  Relationships  . Social connections:    Talks on phone: Not on  file    Gets together: Not on file    Attends religious service: Not on file    Active member of club or organization: Not on file    Attends meetings of clubs or organizations: Not on file    Relationship status: Not on file  Other Topics Concern  . Not on file  Social History Narrative  . Not on file    Outpatient Encounter Medications as of 01/29/2018  Medication Sig  . atorvastatin (LIPITOR) 10 MG tablet TAKE 1 TABLET BY MOUTH  DAILY  . CALCIUM PO Take 300 mg by mouth 4 (four) times daily.   Marland Kitchen levothyroxine (SYNTHROID, LEVOTHROID) 50 MCG tablet Take 1 tablet (50 mcg total) by mouth daily.  Marland Kitchen losartan (COZAAR) 100 MG tablet TAKE 1 TABLET BY MOUTH  DAILY  . Multiple Vitamin (MULTIVITAMIN PO) Take by mouth daily.    . pantoprazole (PROTONIX) 40 MG tablet TAKE 1 TABLET BY MOUTH  DAILY  . venlafaxine XR (EFFEXOR-XR) 37.5 MG 24 hr capsule TAKE 1 CAPSULE BY MOUTH  DAILY WITH BREAKFAST   No facility-administered encounter medications on file as of 01/29/2018.     Review of Systems  Constitutional: Negative for appetite  change and fever.       Has adjusted her diet.  Lost weight.    HENT: Positive for congestion. Negative for sore throat.        Ear fullness and pressure.    Respiratory: Negative for cough, chest tightness and shortness of breath.   Cardiovascular: Negative for chest pain, palpitations and leg swelling.  Gastrointestinal: Negative for abdominal pain, diarrhea, nausea and vomiting.  Genitourinary: Negative for difficulty urinating and dysuria.  Musculoskeletal: Negative for joint swelling and myalgias.  Skin: Negative for color change and rash.  Neurological: Positive for dizziness and light-headedness. Negative for headaches.  Psychiatric/Behavioral: Negative for agitation and dysphoric mood.       Objective:    Physical Exam  Constitutional: She appears well-developed and well-nourished. No distress.  HENT:  Nose: Nose normal.  Mouth/Throat: Oropharynx is clear and moist.  TMs without erythema.    Neck: Neck supple. No thyromegaly present.  Cardiovascular: Normal rate and regular rhythm.  Pulmonary/Chest: Breath sounds normal. No respiratory distress. She has no wheezes.  Abdominal: Soft. Bowel sounds are normal. There is no tenderness.  Musculoskeletal: She exhibits no edema or tenderness.  Lymphadenopathy:    She has no cervical adenopathy.  Skin: No rash noted. No erythema.  Psychiatric: She has a normal mood and affect. Her behavior is normal.    BP 136/82 (BP Location: Left Arm, Patient Position: Sitting, Cuff Size: Large)   Pulse 60   Temp 97.9 F (36.6 C) (Oral)   Resp 18   Wt (!) 316 lb (143.3 kg)   LMP 06/11/2003   SpO2 98%   BMI 54.24 kg/m  Wt Readings from Last 3 Encounters:  01/29/18 (!) 316 lb (143.3 kg)  12/12/17 (!) 326 lb 8 oz (148.1 kg)  11/23/17 (!) 327 lb (148.3 kg)     Lab Results  Component Value Date   WBC 7.0 12/05/2016   HGB 13.2 12/05/2016   HCT 39.6 12/05/2016   PLT 269 12/05/2016   GLUCOSE 88 12/12/2017   CHOL 167 06/12/2017    TRIG 99 06/12/2017   HDL 52 06/12/2017   LDLCALC 95 06/12/2017   ALT 13 06/12/2017   AST 14 06/12/2017   NA 142 12/12/2017   K 5.1 12/12/2017   CL 102 12/12/2017  CREATININE 0.70 12/12/2017   BUN 15 12/12/2017   CO2 27 12/12/2017   TSH 3.700 01/25/2018   HGBA1C 5.3 03/19/2015    Mm Screening Breast Tomo Bilateral  Result Date: 06/04/2017 CLINICAL DATA:  Screening. EXAM: DIGITAL SCREENING BILATERAL MAMMOGRAM WITH TOMO AND CAD COMPARISON:  Previous exam(s). ACR Breast Density Category a: The breast tissue is almost entirely fatty. FINDINGS: There are no findings suspicious for malignancy. Images were processed with CAD. IMPRESSION: No mammographic evidence of malignancy. A result letter of this screening mammogram will be mailed directly to the patient. RECOMMENDATION: Screening mammogram in one year. (Code:SM-B-01Y) BI-RADS CATEGORY  1: Negative. Electronically Signed   By: Nolon Nations M.D.   On: 06/04/2017 08:48       Assessment & Plan:   Problem List Items Addressed This Visit    Ear fullness    Ear fullness and pain.  Dizziness has started.  Has been flying a lot.  Needs to fly again next week for work.  Try afrin nasal spray and nasacort as directed.  mucinex if needed.  Call with update. May need ENT evaluation.        GERD (gastroesophageal reflux disease)    Symptoms controlled on current medication.  Follow.        Hypercholesterolemia    Low cholesterol diet and exercise.  Follow lipid panel and liver function tests.  On lipitor.        Hypertension    Blood pressure as outlined.  She states has been under good control.  Same medication regimen.  Follow pressures.  Follow metabolic panel.            Einar Pheasant, MD

## 2018-01-29 NOTE — Patient Instructions (Signed)
Use the afrin nasal spray as we discussed.    nasacort nasal spray - 2 sprays each nostril one time per day.  Do this in the evening.

## 2018-02-01 ENCOUNTER — Encounter: Payer: Self-pay | Admitting: Internal Medicine

## 2018-02-01 ENCOUNTER — Telehealth: Payer: Self-pay | Admitting: Internal Medicine

## 2018-02-01 ENCOUNTER — Other Ambulatory Visit: Payer: Self-pay | Admitting: Internal Medicine

## 2018-02-01 DIAGNOSIS — H938X2 Other specified disorders of left ear: Secondary | ICD-10-CM

## 2018-02-01 NOTE — Telephone Encounter (Signed)
Sent pt message about seeing ENT

## 2018-02-01 NOTE — Progress Notes (Signed)
Order placed for ENT referral.   

## 2018-02-01 NOTE — Telephone Encounter (Signed)
Pt has tried various medications to help with her ear.  Persistent pain and fullness.  Needs appt with ENT asap.  She is supposed to fly next Wednesday - for her work. Anyway they can see her before Wednesday.

## 2018-02-01 NOTE — Telephone Encounter (Signed)
Sent pt my chart message for ENT referral.

## 2018-02-03 ENCOUNTER — Encounter: Payer: Self-pay | Admitting: Internal Medicine

## 2018-02-03 NOTE — Assessment & Plan Note (Signed)
Low cholesterol diet and exercise.  Follow lipid panel and liver function tests.  On lipitor.   

## 2018-02-03 NOTE — Assessment & Plan Note (Signed)
Blood pressure as outlined.  She states has been under good control.  Same medication regimen.  Follow pressures.  Follow metabolic panel.

## 2018-02-03 NOTE — Assessment & Plan Note (Signed)
Ear fullness and pain.  Dizziness has started.  Has been flying a lot.  Needs to fly again next week for work.  Try afrin nasal spray and nasacort as directed.  mucinex if needed.  Call with update. May need ENT evaluation.

## 2018-02-03 NOTE — Assessment & Plan Note (Signed)
Symptoms controlled on current medication.  Follow.

## 2018-03-11 ENCOUNTER — Other Ambulatory Visit: Payer: Self-pay | Admitting: Internal Medicine

## 2018-03-11 ENCOUNTER — Ambulatory Visit: Payer: Managed Care, Other (non HMO) | Admitting: Internal Medicine

## 2018-03-22 ENCOUNTER — Other Ambulatory Visit: Payer: Self-pay | Admitting: Internal Medicine

## 2018-05-17 ENCOUNTER — Other Ambulatory Visit: Payer: Self-pay | Admitting: Internal Medicine

## 2018-05-20 ENCOUNTER — Encounter: Payer: Self-pay | Admitting: Internal Medicine

## 2018-05-20 DIAGNOSIS — E559 Vitamin D deficiency, unspecified: Secondary | ICD-10-CM

## 2018-05-20 DIAGNOSIS — R7989 Other specified abnormal findings of blood chemistry: Secondary | ICD-10-CM

## 2018-05-20 DIAGNOSIS — I10 Essential (primary) hypertension: Secondary | ICD-10-CM

## 2018-05-20 DIAGNOSIS — E7800 Pure hypercholesterolemia, unspecified: Secondary | ICD-10-CM

## 2018-05-21 NOTE — Telephone Encounter (Signed)
Order for the labs (fasting labs).  Pt notified via my chart.

## 2018-05-24 ENCOUNTER — Ambulatory Visit: Payer: Managed Care, Other (non HMO) | Admitting: Internal Medicine

## 2018-06-12 ENCOUNTER — Other Ambulatory Visit: Payer: Self-pay | Admitting: Internal Medicine

## 2018-06-14 ENCOUNTER — Other Ambulatory Visit: Payer: Self-pay | Admitting: Internal Medicine

## 2018-06-19 ENCOUNTER — Telehealth: Payer: Self-pay | Admitting: Internal Medicine

## 2018-06-19 ENCOUNTER — Telehealth: Payer: Self-pay

## 2018-06-19 NOTE — Telephone Encounter (Signed)
Received a RX request for Levothyroxine from [facility], pt has a RX that is current for this medication at [facility], I called to ask the patient if she made this request and if so a new Rx needs to be sent to [facility] to fill.  Tattianna Schnarr,cma

## 2018-06-19 NOTE — Telephone Encounter (Signed)
Pt l/m she was returning a call that she received about rx for thyroid, and would  like a back .

## 2018-06-21 NOTE — Telephone Encounter (Signed)
Pt wanted to make sure that her synthroid was sent to [facility] instead of [facility]

## 2018-06-21 NOTE — Telephone Encounter (Signed)
LMTCB

## 2018-07-11 LAB — BASIC METABOLIC PANEL
BUN/Creatinine Ratio: 21 (ref 9–23)
BUN: 18 mg/dL (ref 6–24)
CALCIUM: 9.5 mg/dL (ref 8.7–10.2)
CO2: 27 mmol/L (ref 20–29)
Chloride: 102 mmol/L (ref 96–106)
Creatinine, Ser: 0.85 mg/dL (ref 0.57–1.00)
GFR calc Af Amer: 90 mL/min/{1.73_m2} (ref >59)
GFR calc non Af Amer: 78 mL/min/{1.73_m2} (ref >59)
Glucose: 87 mg/dL (ref 65–99)
Potassium: 4.7 mmol/L (ref 3.5–5.2)
Sodium: 142 mmol/L (ref 134–144)

## 2018-07-11 LAB — CBC WITH DIFFERENTIAL/PLATELET
BASOS ABS: 0.1 10*3/uL (ref 0.0–0.2)
Basos: 1 %
EOS (ABSOLUTE): 0.2 10*3/uL (ref 0.0–0.4)
Eos: 2 %
HEMOGLOBIN: 13.7 g/dL (ref 11.1–15.9)
Hematocrit: 42.4 % (ref 34.0–46.6)
IMMATURE GRANS (ABS): 0 10*3/uL (ref 0.0–0.1)
Immature Granulocytes: 0 %
LYMPHS: 26 %
Lymphocytes Absolute: 1.9 10*3/uL (ref 0.7–3.1)
MCH: 27.9 pg (ref 26.6–33.0)
MCHC: 32.3 g/dL (ref 31.5–35.7)
MCV: 86 fL (ref 79–97)
MONOCYTES: 8 %
Monocytes Absolute: 0.6 10*3/uL (ref 0.1–0.9)
Neutrophils Absolute: 4.7 10*3/uL (ref 1.4–7.0)
Neutrophils: 63 %
PLATELETS: 296 10*3/uL (ref 150–450)
RBC: 4.91 x10E6/uL (ref 3.77–5.28)
RDW: 13.8 % (ref 11.7–15.4)
WBC: 7.4 10*3/uL (ref 3.4–10.8)

## 2018-07-11 LAB — HEPATIC FUNCTION PANEL
ALT: 11 IU/L (ref 0–32)
AST: 16 IU/L (ref 0–40)
Albumin: 4.1 g/dL (ref 3.8–4.9)
Alkaline Phosphatase: 98 IU/L (ref 39–117)
Bilirubin Total: 0.4 mg/dL (ref 0.0–1.2)
Bilirubin, Direct: 0.11 mg/dL (ref 0.00–0.40)
Total Protein: 6.2 g/dL (ref 6.0–8.5)

## 2018-07-11 LAB — TSH: TSH: 5.48 u[IU]/mL — ABNORMAL HIGH (ref 0.450–4.500)

## 2018-07-11 LAB — VITAMIN D 25 HYDROXY (VIT D DEFICIENCY, FRACTURES): Vit D, 25-Hydroxy: 26.4 ng/mL — ABNORMAL LOW (ref 30.0–100.0)

## 2018-07-11 LAB — LIPID PANEL
Chol/HDL Ratio: 3.4 ratio (ref 0.0–4.4)
Cholesterol, Total: 175 mg/dL (ref 100–199)
HDL: 51 mg/dL (ref >39)
LDL Calculated: 104 mg/dL — ABNORMAL HIGH (ref 0–99)
Triglycerides: 98 mg/dL (ref 0–149)
VLDL Cholesterol Cal: 20 mg/dL (ref 5–40)

## 2018-07-12 ENCOUNTER — Telehealth: Payer: Self-pay | Admitting: Internal Medicine

## 2018-07-12 ENCOUNTER — Telehealth: Payer: Self-pay | Admitting: *Deleted

## 2018-07-12 DIAGNOSIS — E039 Hypothyroidism, unspecified: Secondary | ICD-10-CM | POA: Insufficient documentation

## 2018-07-12 MED ORDER — LEVOTHYROXINE SODIUM 75 MCG PO TABS: 75.0000 ug | ORAL_TABLET | Freq: Every day | ORAL | 3 refills | Status: DC

## 2018-07-12 NOTE — Telephone Encounter (Signed)
Verbal order given for Levothyroxine increase to 75 mcg script sent to pharmacy as requested.

## 2018-07-12 NOTE — Telephone Encounter (Signed)
-----   Message from Alan Ripper, RN sent at 07/12/2018 12:54 PM EDT ----- Provided lab results per Dr.  Nicki Reaper on 07/12/18.  Patient states that she has been taking the synthriod of 50 mcg.  Patient aware of increase.  Plans to go to [place] in 6 to 8 weeks.  Will make sure that she will take her vitamin D3, 1000 units per day.

## 2018-07-12 NOTE — Telephone Encounter (Signed)
Order placed for f/u tsh.  

## 2018-08-28 ENCOUNTER — Other Ambulatory Visit: Payer: Self-pay | Admitting: Internal Medicine

## 2018-09-10 ENCOUNTER — Other Ambulatory Visit: Payer: Self-pay | Admitting: Internal Medicine

## 2018-09-17 ENCOUNTER — Other Ambulatory Visit: Payer: Self-pay | Admitting: Internal Medicine

## 2018-09-19 ENCOUNTER — Telehealth: Payer: Self-pay

## 2018-09-19 NOTE — Telephone Encounter (Signed)
Copied from [place] [id number]. Topic: General - Other >> Sep 19, 2018 10:23 AM Rainey Pines A wrote: Patient returned call to set up virtual visit on monday 09/23/2018, but hung up while waiting on hold.

## 2018-09-19 NOTE — Telephone Encounter (Signed)
Lm for pt to call back and set up Doxy appt

## 2018-09-21 IMAGING — MG MM DIGITAL SCREENING BILAT W/ TOMO W/ CAD
8 of 15 series · 8 of 31 positions shown · non-contrast
Comparison: Previous exam(s).

ACR Breast Density Category a: The breast tissue is almost entirely
fatty.

CLINICAL DATA: Screening.

EXAM:
DIGITAL SCREENING BILATERAL MAMMOGRAM WITH TOMO AND CAD

[R XCCL]
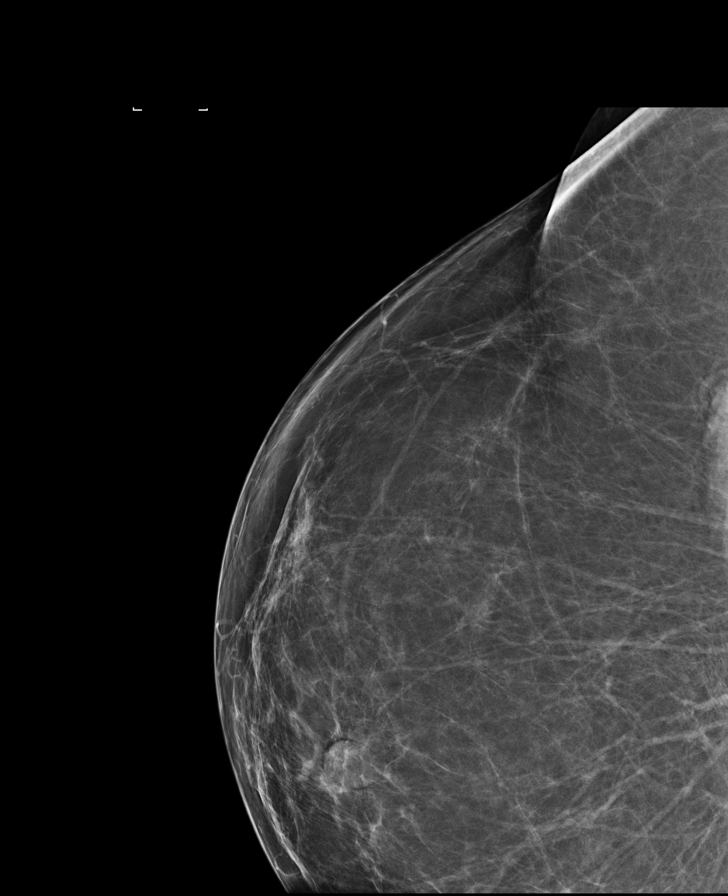

[L CV]
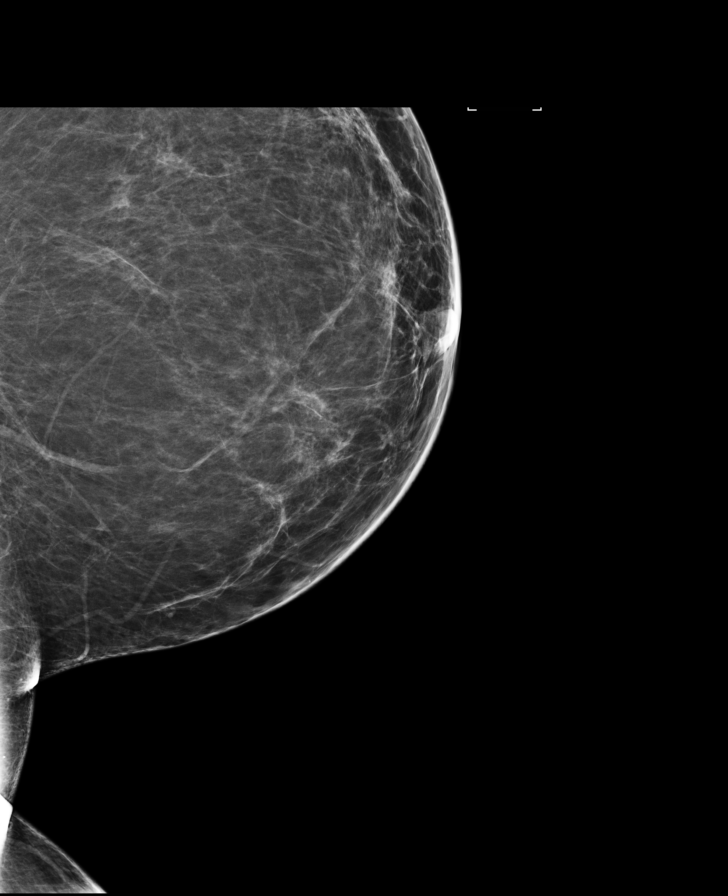

[L MLO (1 of 2)]
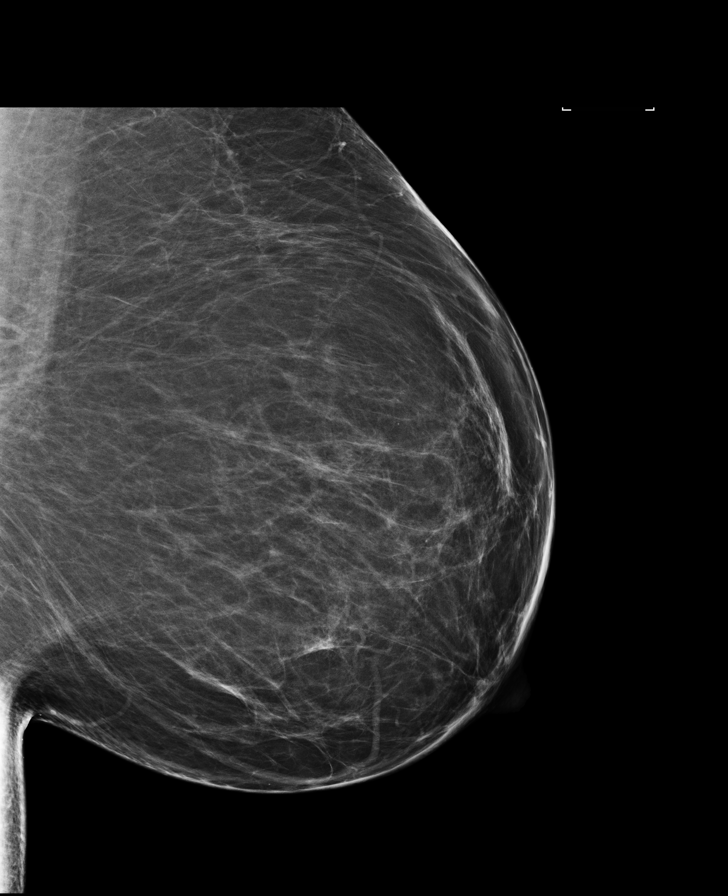

[R MLO]
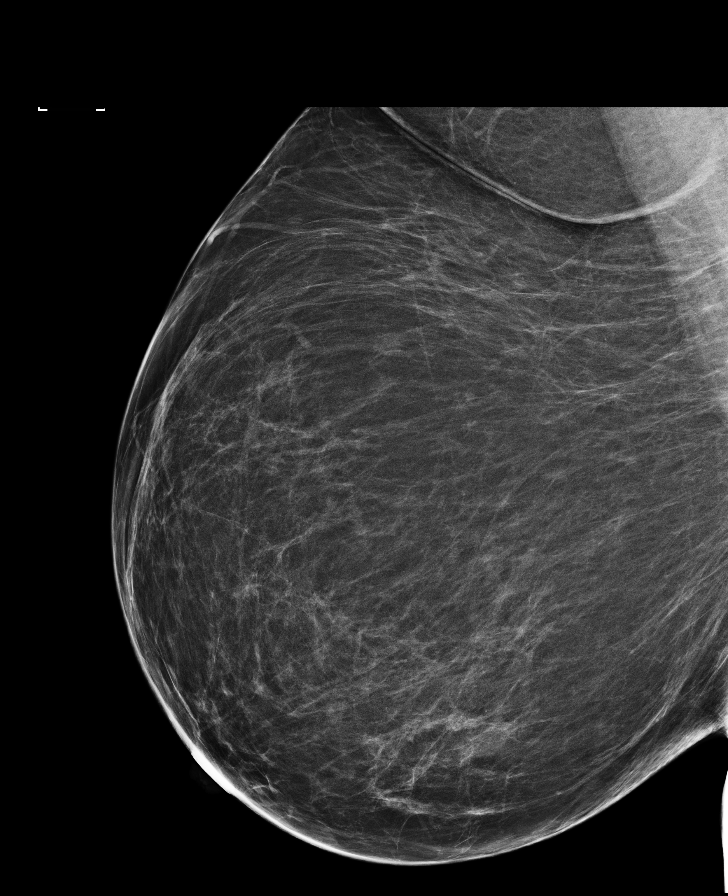

[L CC synth-2D]
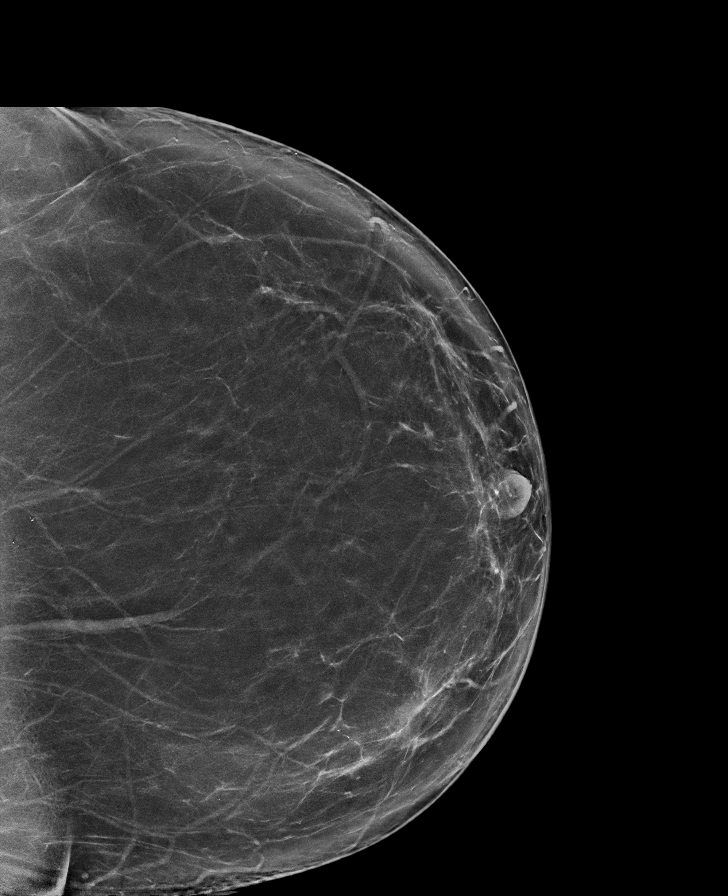

[L MLO (2 of 2)]
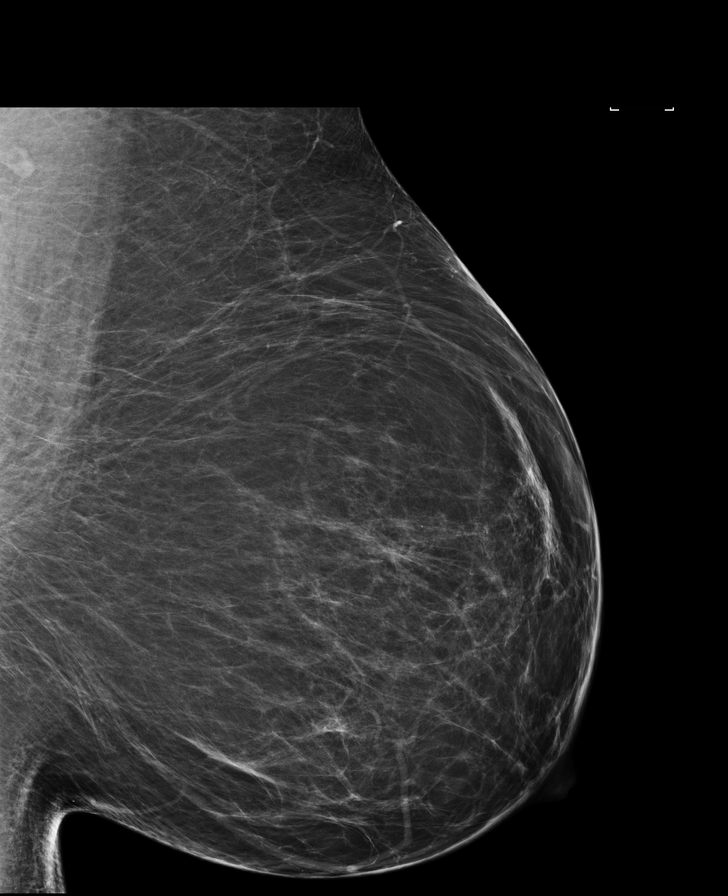

[R CC]
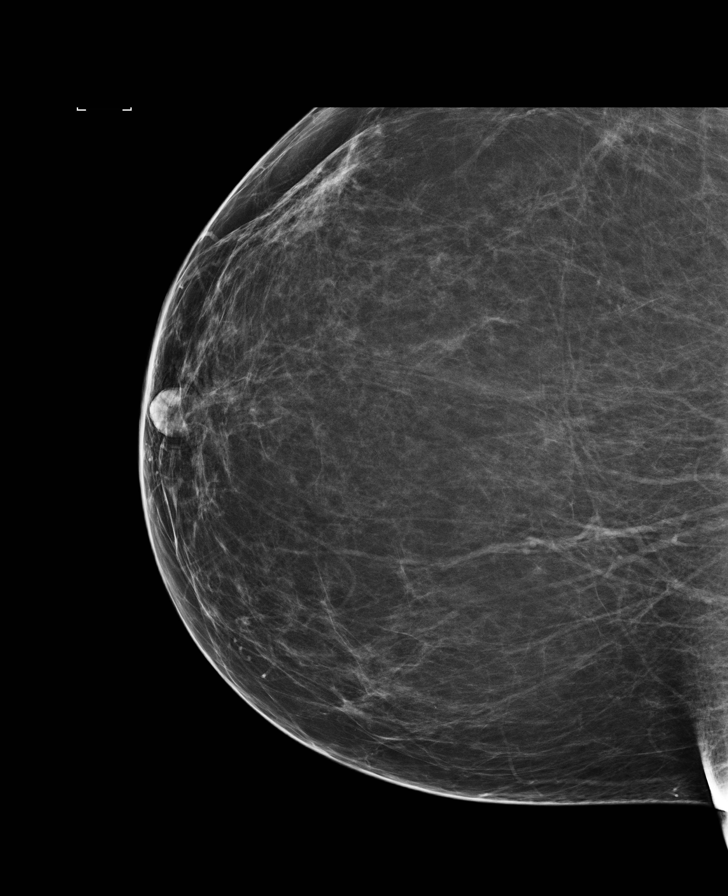

[L MLO synth-2D]
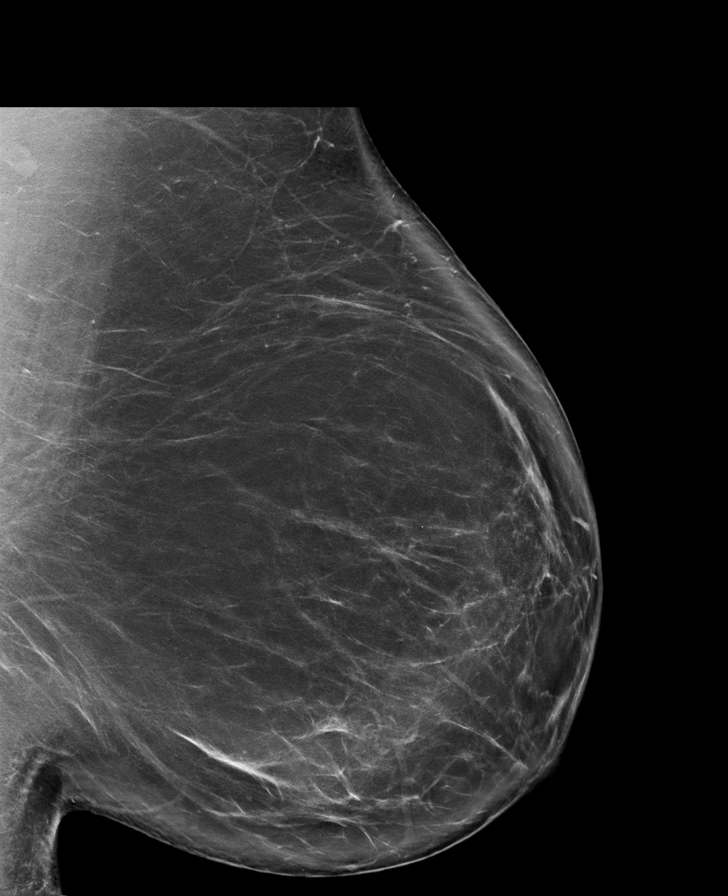

[8 of 31 positions shown; findings below may reference images not displayed]

FINDINGS: There are no findings suspicious for malignancy. Images were
processed with CAD.
IMPRESSION: No mammographic evidence of malignancy. A result letter of this
screening mammogram will be mailed directly to the patient.

RECOMMENDATION:
Screening mammogram in one year. (Code:8Y-[id number])

BI-RADS CATEGORY  1: Negative.

## 2018-09-23 ENCOUNTER — Encounter: Payer: Self-pay | Admitting: Internal Medicine

## 2018-09-23 ENCOUNTER — Other Ambulatory Visit: Payer: Self-pay

## 2018-09-23 ENCOUNTER — Ambulatory Visit (INDEPENDENT_AMBULATORY_CARE_PROVIDER_SITE_OTHER): Payer: Managed Care, Other (non HMO) | Admitting: Internal Medicine

## 2018-09-23 DIAGNOSIS — Z6841 Body Mass Index (BMI) 40.0 and over, adult: Secondary | ICD-10-CM

## 2018-09-23 DIAGNOSIS — E039 Hypothyroidism, unspecified: Secondary | ICD-10-CM

## 2018-09-23 DIAGNOSIS — I10 Essential (primary) hypertension: Secondary | ICD-10-CM

## 2018-09-23 DIAGNOSIS — E7800 Pure hypercholesterolemia, unspecified: Secondary | ICD-10-CM | POA: Diagnosis not present

## 2018-09-23 DIAGNOSIS — Z1231 Encounter for screening mammogram for malignant neoplasm of breast: Secondary | ICD-10-CM

## 2018-09-23 NOTE — Progress Notes (Signed)
Patient ID: Kristine Hendricks, female   DOB: Aug 27, 1965, 53 y.o.   MRN: [id number] .Marland Kitchen...   Virtual Visit via telephone Note  This visit type was conducted due to national recommendations for restrictions regarding the COVID-19 pandemic (e.g. social distancing).  This format is felt to be most appropriate for this patient at this time.  All issues noted in this document were discussed and addressed.  No physical exam was performed (except for noted visual exam findings with Video Visits).   I connected with Kristine Hendricks by telephone and verified that I am speaking with the correct person using two identifiers. Location patient: home Location provider: work Persons participating in the telephone visit: patient, provider  I discussed the limitations, risks, security and privacy concerns of performing an evaluation and management service by telephone and the availability of in person appointments.  The patient expressed understanding and agreed to proceed.   Reason for visit:   HPI: She reports she is doing relatively well.  Working from home.  Handling stress.  Previous ear fullness.  Resolved.  Trying to stay in secondary to covid restrictions.  No chest pain.  No chest congestion, sob or cough.  No acid reflux. No abdominal pain.  Bowels moving.  Walking every day.  Discussed diet and exercise.  Blood pressure doing well.     ROS: See pertinent positives and negatives per HPI.  Past Medical History:  Diagnosis Date  . Anemia   . Asthma   . GERD (gastroesophageal reflux disease)   . History of chicken pox   . Hypercholesterolemia   . Hypertension   . Hypothyroidism   . Seasonal allergies   . Sleep apnea     Past Surgical History:  Procedure Laterality Date  . arthroscopic surgery     Dr Sabra Heck  . BREAST CYST ASPIRATION Right 2005  . BREAST SURGERY  4 /05   biopsy  . CHOLECYSTECTOMY  2007  . COLONOSCOPY WITH PROPOFOL N/A 02/14/2016   Procedure: COLONOSCOPY WITH PROPOFOL;   Surgeon: Manya Silvas, MD;  Location: [facility] ENDOSCOPY;  Service: Endoscopy;  Laterality: N/A;  . DILATION AND CURETTAGE OF UTERUS  8/04  . LAPAROSCOPIC GASTRIC BANDING  01/29/09  . MYOMECTOMY  9/04  . VAGINAL HYSTERECTOMY  06/11/03    Family History  Problem Relation Age of Onset  . Lung cancer Mother   . Arthritis Mother   . Stroke Mother   . Hypertension Mother   . Colon polyps Mother   . Thyroid disease Sister        graves disease  . Colon cancer Other        maternal grandparent  . Hyperlipidemia Father   . Stroke Maternal Aunt   . Stroke Maternal Uncle   . Cancer Maternal Grandmother        colon  . Alcohol abuse Cousin   . Breast cancer Neg Hx   . Heart disease Neg Hx     SOCIAL HX: reviewed.    Current Outpatient Medications:  .  atorvastatin (LIPITOR) 10 MG tablet, TAKE 1 TABLET BY MOUTH  DAILY, Disp: 90 tablet, Rfl: 1 .  CALCIUM PO, Take 300 mg by mouth 4 (four) times daily. , Disp: , Rfl:  .  levothyroxine (SYNTHROID) 75 MCG tablet, Take 1 tablet (75 mcg total) by mouth daily., Disp: 90 tablet, Rfl: 3 .  losartan (COZAAR) 100 MG tablet, TAKE 1 TABLET BY MOUTH  DAILY, Disp: 90 tablet, Rfl: 3 .  Multiple Vitamin (MULTIVITAMIN  PO), Take by mouth daily.  , Disp: , Rfl:  .  pantoprazole (PROTONIX) 40 MG tablet, TAKE 1 TABLET BY MOUTH  DAILY, Disp: 90 tablet, Rfl: 1 .  venlafaxine XR (EFFEXOR-XR) 37.5 MG 24 hr capsule, TAKE 1 CAPSULE BY MOUTH  DAILY WITH BREAKFAST, Disp: 90 capsule, Rfl: 1  EXAM:  VITALS per patient if applicable: 257/49  GENERAL: alert.  Answering questions appropriately.  Sounds to be in no acute   PSYCH/NEURO: pleasant and cooperative, no obvious depression or anxiety, speech and thought processing grossly intact  ASSESSMENT AND PLAN:  Discussed the following assessment and plan:  Hypothyroid On thyroid replacement.  Follow tsh.   Hypertension Blood pressure doing well.  Continue current medication regimen.  Follow pressures.  Follow  metabolic panel.    Hypercholesterolemia Low cholesterol diet and exercise.  On lipitor.  Follow lipid panel and liver function tests.    BMI 50.0-59.9, adult [facility]) She is walking.  Discussed diet, exercise and weight loss.  Follow.      I discussed the assessment and treatment plan with the patient. The patient was provided an opportunity to ask questions and all were answered. The patient agreed with the plan and demonstrated an understanding of the instructions.   The patient was advised to call back or seek an in-person evaluation if the symptoms worsen or if the condition fails to improve as anticipated.  I provided 18 minutes of non-face-to-face time during this encounter.   Einar Pheasant, MD

## 2018-09-24 ENCOUNTER — Encounter: Payer: Self-pay | Admitting: Internal Medicine

## 2018-09-28 ENCOUNTER — Encounter: Payer: Self-pay | Admitting: Internal Medicine

## 2018-09-28 NOTE — Assessment & Plan Note (Signed)
Low cholesterol diet and exercise.  On lipitor.  Follow lipid panel and liver function tests.   

## 2018-09-28 NOTE — Assessment & Plan Note (Signed)
On thyroid replacement.  Follow tsh.  

## 2018-09-28 NOTE — Assessment & Plan Note (Signed)
She is walking.  Discussed diet, exercise and weight loss.  Follow.

## 2018-09-28 NOTE — Assessment & Plan Note (Signed)
Blood pressure doing well.  Continue current medication regimen.  Follow pressures.  Follow metabolic panel.  

## 2018-10-22 ENCOUNTER — Telehealth: Payer: Self-pay | Admitting: Internal Medicine

## 2018-10-22 NOTE — Telephone Encounter (Signed)
Opened in error

## 2018-12-08 ENCOUNTER — Other Ambulatory Visit: Payer: Self-pay | Admitting: Internal Medicine

## 2019-01-02 ENCOUNTER — Ambulatory Visit
Admission: RE | Admit: 2019-01-02 | Discharge: 2019-01-02 | Disposition: A | Discharge status: Home / Self Care | Payer: Managed Care, Other (non HMO) | Source: Ambulatory Visit | Attending: Internal Medicine | Admitting: Internal Medicine

## 2019-01-02 DIAGNOSIS — Z1231 Encounter for screening mammogram for malignant neoplasm of breast: Secondary | ICD-10-CM | POA: Diagnosis present

## 2019-01-30 ENCOUNTER — Encounter: Payer: Managed Care, Other (non HMO) | Admitting: Internal Medicine

## 2019-02-11 ENCOUNTER — Other Ambulatory Visit: Payer: Self-pay

## 2019-02-11 ENCOUNTER — Other Ambulatory Visit: Payer: Self-pay | Admitting: Internal Medicine

## 2019-02-11 ENCOUNTER — Ambulatory Visit: Payer: Managed Care, Other (non HMO)

## 2019-02-11 ENCOUNTER — Ambulatory Visit (INDEPENDENT_AMBULATORY_CARE_PROVIDER_SITE_OTHER): Payer: Managed Care, Other (non HMO) | Admitting: Podiatry

## 2019-02-11 ENCOUNTER — Encounter: Payer: Self-pay | Admitting: Podiatry

## 2019-02-11 DIAGNOSIS — M79672 Pain in left foot: Secondary | ICD-10-CM

## 2019-02-11 DIAGNOSIS — D3610 Benign neoplasm of peripheral nerves and autonomic nervous system, unspecified: Secondary | ICD-10-CM | POA: Diagnosis not present

## 2019-02-11 DIAGNOSIS — M21622 Bunionette of left foot: Secondary | ICD-10-CM | POA: Diagnosis not present

## 2019-02-11 DIAGNOSIS — M2062 Acquired deformities of toe(s), unspecified, left foot: Secondary | ICD-10-CM | POA: Diagnosis not present

## 2019-02-14 ENCOUNTER — Encounter: Payer: Self-pay | Admitting: Podiatry

## 2019-02-14 NOTE — Progress Notes (Signed)
Subjective:  Patient ID: Kristine Hendricks, female    DOB: 1965-06-17,  MRN: [id number]  Chief Complaint  Patient presents with  . Foot Pain    53 y.o. female presents with the above complaint.  Patient presents with complaints of left fourth interspace neuroma.  Patient states it is shooting and aching in nature.  Patient denies doing anything to taking care of them.  Patient states has been causing the fifth digit also return and in an adductovarus form.  Patient states this has been going on for 6 months and it constantly throbs at night.  She has denies any treatment options she denies seeing any other podiatrist.  She also has secondary complaint of left foot fifth metatarsal tailor's bunion.   Review of Systems: Negative except as noted in the HPI. Denies N/V/F/Ch.  Past Medical History:  Diagnosis Date  . Anemia   . Asthma   . GERD (gastroesophageal reflux disease)   . History of chicken pox   . Hypercholesterolemia   . Hypertension   . Hypothyroidism   . Seasonal allergies   . Sleep apnea     Current Outpatient Medications:  .  atorvastatin (LIPITOR) 10 MG tablet, TAKE 1 TABLET BY MOUTH  DAILY, Disp: 90 tablet, Rfl: 3 .  CALCIUM PO, Take 300 mg by mouth 4 (four) times daily. , Disp: , Rfl:  .  levothyroxine (SYNTHROID) 75 MCG tablet, Take 1 tablet (75 mcg total) by mouth daily., Disp: 90 tablet, Rfl: 3 .  losartan (COZAAR) 100 MG tablet, TAKE 1 TABLET BY MOUTH  DAILY, Disp: 90 tablet, Rfl: 3 .  Multiple Vitamin (MULTIVITAMIN PO), Take by mouth daily.  , Disp: , Rfl:  .  pantoprazole (PROTONIX) 40 MG tablet, TAKE 1 TABLET BY MOUTH  DAILY, Disp: 90 tablet, Rfl: 3 .  venlafaxine XR (EFFEXOR-XR) 37.5 MG 24 hr capsule, TAKE 1 CAPSULE BY MOUTH  DAILY WITH BREAKFAST, Disp: 90 capsule, Rfl: 1  Social History   Tobacco Use  Smoking Status Never Smoker  Smokeless Tobacco Never Used    Allergies  Allergen Reactions  . Sulfa Antibiotics Rash    All over the body.  .  Sulfasalazine Rash    All over the body.  . Codeine Nausea And Vomiting   Objective:  There were no vitals filed for this visit. There is no height or weight on file to calculate BMI. Constitutional Well developed. Well nourished.  Vascular Dorsalis pedis pulses palpable bilaterally. Posterior tibial pulses palpable bilaterally. Capillary refill normal to all digits.  No cyanosis or clubbing noted. Pedal hair growth normal.  Neurologic Normal speech. Oriented to person, place, and time. Epicritic sensation to light touch grossly present bilaterally.  Dermatologic Nails well groomed and normal in appearance. No open wounds. No skin lesions.  Orthopedic:  Left foot positive Mulder's click at the fourth interspace.  No pain with range of motion of the fifth digit on the metatarsophalangeal joint active and passive.  No intra-articular pain.  Mild pain on palpation to the lateral aspect of the fifth metatarsal head.  There is a small callus formation/hyperkeratotic lesion of the fifth metatarsal head lateral aspect.   Radiographs: 3 views of skeletally mature adult foot: There is an increase in soft tissue density and volume around the fifth metatarsal phalangeal joint.  There is an increase in IM angle between the fourth metatarsal and fifth metatarsal.  The lateral deviation angle is slightly increased.  No arthritic osteophytes noted at the fifth metatarsophalangeal joint.  There is an adductovarus component to the left fifth digit. Assessment:   1. Tailor's bunion of left foot   2. Acquired deformity of left toe   3. Neuroma   4. Pain in left foot    Plan:  Patient was evaluated and treated and all questions answered. Left fourth interspace neuroma versus capsulitis -Given her clinical exam, I believe patient will benefit from a diagnostic  nerve block of the fourth interspace.  If her pain is resolved even temporarily this will be diagnostic of fourth interspace neuroma.  At that  point I will discuss 4% scar sclerosing alcohol injection versus surgical removal. -After obtaining consent, and per orders of Dr. Posey Pronto, injection of 1% lidocaine plain and half percent Marcaine plain 3 cc given by Felipa Furnace. Patient instructed to remain in clinic for 20 minutes afterwards, and to report any adverse reaction to me immediately.  Left foot tailor's bunion/adductovarus deformity of the fifth digit -I explained to the patient the etiology of tailor's bunion and the rotation of the fifth digit.  I explained to her all the possible treatment options available including wider toe box shoes padding.  Patient states that she will try those and see if that gives her any pain relief.  I explained to her that if this does not help relieve her pain patient may need a surgical intervention to realign her tailor's bunion and address the rotation of the fifth digit.   Return in about 4 weeks (around 03/11/2019) for Neuroma.

## 2019-02-25 ENCOUNTER — Ambulatory Visit: Payer: Managed Care, Other (non HMO) | Admitting: Podiatry

## 2019-02-25 ENCOUNTER — Other Ambulatory Visit: Payer: Self-pay

## 2019-02-25 ENCOUNTER — Encounter: Payer: Self-pay | Admitting: Podiatry

## 2019-02-25 DIAGNOSIS — M79672 Pain in left foot: Secondary | ICD-10-CM

## 2019-02-25 DIAGNOSIS — D3610 Benign neoplasm of peripheral nerves and autonomic nervous system, unspecified: Secondary | ICD-10-CM

## 2019-02-25 NOTE — Progress Notes (Signed)
Subjective:  Patient ID: Kristine Hendricks, female    DOB: [birth year],  MRN: [id number]  Chief Complaint  Patient presents with  . Foot Pain    pt is here for a f/u on left foot, pt states that she is doing alot better since the last time she was here, pt has no comments or concerns    53 y.o. female presents with the above complaint.  Patient presents with left fourth interspace neuroma.  Patient states the injection completely helped relieve her pain.  I explained to the patient that this was diagnostic block and I am glad that it gave her relief.  However this does not give her to of the neuroma itself.  Patient states that there is still some pain associated in the fourth interspace.  She denies any other acute complaints.  Review of Systems: Negative except as noted in the HPI. Denies N/V/F/Ch.  Past Medical History:  Diagnosis Date  . Anemia   . Asthma   . GERD (gastroesophageal reflux disease)   . History of chicken pox   . Hypercholesterolemia   . Hypertension   . Hypothyroidism   . Seasonal allergies   . Sleep apnea     Current Outpatient Medications:  .  atorvastatin (LIPITOR) 10 MG tablet, TAKE 1 TABLET BY MOUTH  DAILY, Disp: 90 tablet, Rfl: 3 .  CALCIUM PO, Take 300 mg by mouth 4 (four) times daily. , Disp: , Rfl:  .  levothyroxine (SYNTHROID) 75 MCG tablet, Take 1 tablet (75 mcg total) by mouth daily., Disp: 90 tablet, Rfl: 3 .  losartan (COZAAR) 100 MG tablet, TAKE 1 TABLET BY MOUTH  DAILY, Disp: 90 tablet, Rfl: 3 .  Multiple Vitamin (MULTIVITAMIN PO), Take by mouth daily.  , Disp: , Rfl:  .  pantoprazole (PROTONIX) 40 MG tablet, TAKE 1 TABLET BY MOUTH  DAILY, Disp: 90 tablet, Rfl: 3 .  venlafaxine XR (EFFEXOR-XR) 37.5 MG 24 hr capsule, TAKE 1 CAPSULE BY MOUTH  DAILY WITH BREAKFAST, Disp: 90 capsule, Rfl: 1  Social History   Tobacco Use  Smoking Status Never Smoker  Smokeless Tobacco Never Used    Allergies  Allergen Reactions  . Sulfa Antibiotics Rash   All over the body.  . Sulfasalazine Rash    All over the body.  . Codeine Nausea And Vomiting   Objective:  There were no vitals filed for this visit. There is no height or weight on file to calculate BMI. Constitutional Well developed. Well nourished.  Vascular Dorsalis pedis pulses palpable bilaterally. Posterior tibial pulses palpable bilaterally. Capillary refill normal to all digits.  No cyanosis or clubbing noted. Pedal hair growth normal.  Neurologic Normal speech. Oriented to person, place, and time. Epicritic sensation to light touch grossly present bilaterally.  Dermatologic Nails well groomed and normal in appearance. No open wounds. No skin lesions.  Orthopedic:  Left foot positive Mulder's click at the fourth interspace.  No pain with range of motion of the fifth digit on the metatarsophalangeal joint active and passive.  No intra-articular pain.  Mild pain on palpation to the lateral aspect of the fifth metatarsal head.  There is a small callus formation/hyperkeratotic lesion of the fifth metatarsal head lateral aspect.   Radiographs: None Assessment:   1. Neuroma   2. Pain in left foot    Plan:  Patient was evaluated and treated and all questions answered.  Left fourth interspace neuroma -I explained to the patient given that the block helped decrease her  pain considerably, this rules in an diagnosis for neuroma of the fourth interspace.  I explained to the patient the treatment options available including alcohol injections 4% versus surgical excision.  Patient has elected to undergo 4% alcohol injections.  I explained to her that this will take 6 to 7 injections to completely destroy the nerve.  Patient understands the risk and she would like to proceed.  After obtaining consent, and per orders of Dr. Posey Pronto, injection of left fourth interspace 4% sclerosing alcohol injection 1.5 cc given by Felipa Furnace. Patient instructed to remain in clinic for 20 minutes  afterwards, and to report any adverse reaction to me immediately.   No follow-ups on file.

## 2019-03-05 ENCOUNTER — Other Ambulatory Visit: Payer: Managed Care, Other (non HMO)

## 2019-03-11 ENCOUNTER — Ambulatory Visit (INDEPENDENT_AMBULATORY_CARE_PROVIDER_SITE_OTHER): Payer: Managed Care, Other (non HMO) | Admitting: Internal Medicine

## 2019-03-11 ENCOUNTER — Encounter: Payer: Self-pay | Admitting: Internal Medicine

## 2019-03-11 DIAGNOSIS — E039 Hypothyroidism, unspecified: Secondary | ICD-10-CM

## 2019-03-11 DIAGNOSIS — Z860100 Personal history of colon polyps, unspecified: Secondary | ICD-10-CM

## 2019-03-11 DIAGNOSIS — E7800 Pure hypercholesterolemia, unspecified: Secondary | ICD-10-CM

## 2019-03-11 DIAGNOSIS — K2100 Gastro-esophageal reflux disease with esophagitis, without bleeding: Secondary | ICD-10-CM | POA: Diagnosis not present

## 2019-03-11 DIAGNOSIS — R7989 Other specified abnormal findings of blood chemistry: Secondary | ICD-10-CM

## 2019-03-11 DIAGNOSIS — I10 Essential (primary) hypertension: Secondary | ICD-10-CM

## 2019-03-11 NOTE — Assessment & Plan Note (Signed)
Low cholesterol diet and exercise.  On lipitor.  Follow lipid panel and liver function tests.   

## 2019-03-11 NOTE — Progress Notes (Signed)
Patient ID: Kristine Hendricks, female   DOB: 18-Aug-1965, 53 y.o.   MRN: [id number]   Virtual Visit via video Note  This visit type was conducted due to national recommendations for restrictions regarding the COVID-19 pandemic (e.g. social distancing).  This format is felt to be most appropriate for this patient at this time.  All issues noted in this document were discussed and addressed.  No physical exam was performed (except for noted visual exam findings with Video Visits).   I connected with Kristine Hendricks by a video enabled telemedicine application and verified that I am speaking with the correct person using two identifiers. Location patient: home Location provider: work Persons participating in the virtual visit: patient, provider  I discussed the limitations, risks, security and privacy concerns of performing an evaluation and management service by video and the availability of in person appointments. The patient expressed understanding and agreed to proceed.   Reason for visit: scheduled follow up.    HPI: She reports she is doing relatively well.  Working from home.  Increased stress.  Mother passed away recently.  Overall she feels she is handling things relatively well.  Does not feel needs any further intervention.  Two weeks ago, had fatigue, diarrhea and headache.  covid test was negative.  Still some nasal stuffiness, but feels better.  Never lost her sense of taste or smell.  Does not feel needs anything more.  No chest congestion, cough or sob.  No nausea or vomiting.  Seeing podiatry - left fourth interspace neuroma.  Receiving alcohol injections currently.  Still with pain.  Discussed diet and exercise.  Blood pressure overall doing ok.  Last check 128/84.  Has a new dermatologist.  No rash currently.     ROS: See pertinent positives and negatives per HPI.  Past Medical History:  Diagnosis Date  . Anemia   . Asthma   . GERD (gastroesophageal reflux disease)   . History  of chicken pox   . Hypercholesterolemia   . Hypertension   . Hypothyroidism   . Seasonal allergies   . Sleep apnea     Past Surgical History:  Procedure Laterality Date  . arthroscopic surgery     Dr Sabra Heck  . BREAST CYST ASPIRATION Right 2005  . BREAST SURGERY  4 /05   biopsy  . CHOLECYSTECTOMY  2007  . COLONOSCOPY WITH PROPOFOL N/A 02/14/2016   Procedure: COLONOSCOPY WITH PROPOFOL;  Surgeon: Manya Silvas, MD;  Location: [facility] ENDOSCOPY;  Service: Endoscopy;  Laterality: N/A;  . DILATION AND CURETTAGE OF UTERUS  8/04  . LAPAROSCOPIC GASTRIC BANDING  01/29/09  . MYOMECTOMY  9/04  . VAGINAL HYSTERECTOMY  06/11/03    Family History  Problem Relation Age of Onset  . Lung cancer Mother   . Arthritis Mother   . Stroke Mother   . Hypertension Mother   . Colon polyps Mother   . Thyroid disease Sister        graves disease  . Colon cancer Other        maternal grandparent  . Hyperlipidemia Father   . Stroke Maternal Aunt   . Stroke Maternal Uncle   . Cancer Maternal Grandmother        colon  . Alcohol abuse Cousin   . Breast cancer Neg Hx   . Heart disease Neg Hx     SOCIAL HX: reviewed.    Current Outpatient Medications:  .  atorvastatin (LIPITOR) 10 MG tablet, TAKE 1 TABLET BY MOUTH  DAILY, Disp: 90 tablet, Rfl: 3 .  CALCIUM PO, Take 300 mg by mouth 4 (four) times daily. , Disp: , Rfl:  .  levothyroxine (SYNTHROID) 75 MCG tablet, Take 1 tablet (75 mcg total) by mouth daily., Disp: 90 tablet, Rfl: 3 .  losartan (COZAAR) 100 MG tablet, TAKE 1 TABLET BY MOUTH  DAILY, Disp: 90 tablet, Rfl: 3 .  Multiple Vitamin (MULTIVITAMIN PO), Take by mouth daily.  , Disp: , Rfl:  .  pantoprazole (PROTONIX) 40 MG tablet, TAKE 1 TABLET BY MOUTH  DAILY, Disp: 90 tablet, Rfl: 3 .  venlafaxine XR (EFFEXOR-XR) 37.5 MG 24 hr capsule, TAKE 1 CAPSULE BY MOUTH  DAILY WITH BREAKFAST, Disp: 90 capsule, Rfl: 1  EXAM:  VITALS per patient if applicable: last check 123XX123  GENERAL: alert,  oriented, appears well and in no acute distress  HEENT: atraumatic, conjunttiva clear, no obvious abnormalities on inspection of external nose and ears  NECK: normal movements of the head and neck  LUNGS: on inspection no signs of respiratory distress, breathing rate appears normal, no obvious gross SOB, gasping or wheezing  CV: no obvious cyanosis  PSYCH/NEURO: pleasant and cooperative, no obvious depression or anxiety, speech and thought processing grossly intact  ASSESSMENT AND PLAN:  Discussed the following assessment and plan:  Elevated TSH Slight elevation last check.  Recheck tsh.    GERD (gastroesophageal reflux disease) No acid reflux symptoms reported.  Follow.   History of colonic polyps Last colonoscopy 01/2016.  Recommended f/u in 5 years.    Hypercholesterolemia Low cholesterol diet and exercise.  On lipitor.  Follow lipid panel and liver function tests.    Hypertension Blood pressure as outlined.  Continue current medication regimen.  Follow pressures.  Follow metabolic panel.   Hypothyroid On thyroid replacement.  Follow tsh.     I discussed the assessment and treatment plan with the patient. The patient was provided an opportunity to ask questions and all were answered. The patient agreed with the plan and demonstrated an understanding of the instructions.   The patient was advised to call back or seek an in-person evaluation if the symptoms worsen or if the condition fails to improve as anticipated.   Einar Pheasant, MD

## 2019-03-11 NOTE — Assessment & Plan Note (Signed)
Blood pressure as outlined.  Continue current medication regimen.  Follow pressures.  Follow metabolic panel.  

## 2019-03-11 NOTE — Assessment & Plan Note (Signed)
No acid reflux symptoms reported.  Follow.

## 2019-03-11 NOTE — Assessment & Plan Note (Signed)
Slight elevation last check.  Recheck tsh.

## 2019-03-11 NOTE — Assessment & Plan Note (Signed)
Last colonoscopy 01/2016.  Recommended f/u in 5 years.

## 2019-03-11 NOTE — Assessment & Plan Note (Signed)
On thyroid replacement.  Follow tsh.  

## 2019-03-18 ENCOUNTER — Ambulatory Visit: Payer: Managed Care, Other (non HMO) | Admitting: Podiatry

## 2019-03-18 ENCOUNTER — Other Ambulatory Visit: Payer: Self-pay

## 2019-03-18 ENCOUNTER — Encounter: Payer: Self-pay | Admitting: Podiatry

## 2019-03-18 DIAGNOSIS — D3610 Benign neoplasm of peripheral nerves and autonomic nervous system, unspecified: Secondary | ICD-10-CM | POA: Diagnosis not present

## 2019-03-18 DIAGNOSIS — M79672 Pain in left foot: Secondary | ICD-10-CM | POA: Diagnosis not present

## 2019-03-18 NOTE — Progress Notes (Signed)
Subjective:  Patient ID: Kristine Hendricks, female    DOB: 1965/08/18,  MRN: [id number]  Chief Complaint  Patient presents with  . Foot Pain    pt is here for a f/u of neuroma of the left foot, pt states that the left foot is feeling about the same since the last time she was here, pt also states that the injection wore off after about 2 weeks    53 y.o. female presents with the above complaint.  Patient presents with left fourth interspace neuroma.  Patient is here for her 4% alcohol sclerosing injection.  Patient states that it gave her 2 weeks of relief after the last injection.  She denies any other acute complaints.  This will be her second alcohol sclerosing injection.  Review of Systems: Negative except as noted in the HPI. Denies N/V/F/Ch.  Past Medical History:  Diagnosis Date  . Anemia   . Asthma   . GERD (gastroesophageal reflux disease)   . History of chicken pox   . Hypercholesterolemia   . Hypertension   . Hypothyroidism   . Seasonal allergies   . Sleep apnea     Current Outpatient Medications:  .  atorvastatin (LIPITOR) 10 MG tablet, TAKE 1 TABLET BY MOUTH  DAILY, Disp: 90 tablet, Rfl: 3 .  CALCIUM PO, Take 300 mg by mouth 4 (four) times daily. , Disp: , Rfl:  .  levothyroxine (SYNTHROID) 75 MCG tablet, Take 1 tablet (75 mcg total) by mouth daily., Disp: 90 tablet, Rfl: 3 .  losartan (COZAAR) 100 MG tablet, TAKE 1 TABLET BY MOUTH  DAILY, Disp: 90 tablet, Rfl: 3 .  Multiple Vitamin (MULTIVITAMIN PO), Take by mouth daily.  , Disp: , Rfl:  .  pantoprazole (PROTONIX) 40 MG tablet, TAKE 1 TABLET BY MOUTH  DAILY, Disp: 90 tablet, Rfl: 3 .  venlafaxine XR (EFFEXOR-XR) 37.5 MG 24 hr capsule, TAKE 1 CAPSULE BY MOUTH  DAILY WITH BREAKFAST, Disp: 90 capsule, Rfl: 1  Social History   Tobacco Use  Smoking Status Never Smoker  Smokeless Tobacco Never Used    Allergies  Allergen Reactions  . Sulfa Antibiotics Rash    All over the body.  . Sulfasalazine Rash    All over  the body.  . Codeine Nausea And Vomiting   Objective:  There were no vitals filed for this visit. There is no height or weight on file to calculate BMI. Constitutional Well developed. Well nourished.  Vascular Dorsalis pedis pulses palpable bilaterally. Posterior tibial pulses palpable bilaterally. Capillary refill normal to all digits.  No cyanosis or clubbing noted. Pedal hair growth normal.  Neurologic Normal speech. Oriented to person, place, and time. Epicritic sensation to light touch grossly present bilaterally.  Dermatologic Nails well groomed and normal in appearance. No open wounds. No skin lesions.  Orthopedic:  Left foot positive Mulder's click at the fourth interspace.  No pain with range of motion of the fifth digit on the metatarsophalangeal joint active and passive.  No intra-articular pain.  Mild pain on palpation to the lateral aspect of the fifth metatarsal head.  There is a small callus formation/hyperkeratotic lesion of the fifth metatarsal head lateral aspect.   Radiographs: None Assessment:   1. Neuroma   2. Pain in left foot    Plan:  Patient was evaluated and treated and all questions answered.  Left fourth interspace neuroma -I explained to the patient given that the block helped decrease her pain considerably, this rules in an diagnosis for  neuroma of the fourth interspace.  I explained to the patient the treatment options available including alcohol injections 4% versus surgical excision.  Patient has elected to undergo 4% alcohol injections.  Today is her second alcohol sclerosing injection.  Patient understands the risk and she would like to proceed.  After obtaining consent, and per orders of Dr. Posey Pronto, injection of left fourth interspace 4% sclerosing alcohol injection 1.5 cc given by Felipa Furnace. Patient instructed to remain in clinic for 20 minutes afterwards, and to report any adverse reaction to me immediately.   No follow-ups on file.

## 2019-03-26 ENCOUNTER — Other Ambulatory Visit: Payer: Self-pay | Admitting: Internal Medicine

## 2019-04-08 ENCOUNTER — Other Ambulatory Visit: Payer: Self-pay

## 2019-04-08 ENCOUNTER — Ambulatory Visit: Payer: Managed Care, Other (non HMO) | Admitting: Podiatry

## 2019-04-08 ENCOUNTER — Encounter: Payer: Self-pay | Admitting: Podiatry

## 2019-04-08 DIAGNOSIS — D3610 Benign neoplasm of peripheral nerves and autonomic nervous system, unspecified: Secondary | ICD-10-CM

## 2019-04-08 DIAGNOSIS — M79672 Pain in left foot: Secondary | ICD-10-CM | POA: Diagnosis not present

## 2019-04-08 NOTE — Progress Notes (Signed)
Subjective:  Patient ID: Kristine Hendricks, female    DOB: [birth year],  MRN: [id number]  Chief Complaint  Patient presents with  . Neuroma    pt is here for a f/u of the left foot neuroma, pt states that this is the third injection that she could possibly be recieving, pt states that the injection has been helping, but is still feeling pain, especially when applying pressure, pt also states that pain has now radiated to the top of the left foot.    54 y.o. female presents with the above complaint.  Patient presents with left fourth interspace neuroma.  Patient is here for her 4% alcohol sclerosing injection.  Patient states that it gave her 2 weeks of relief after the last injection.  She denies any other acute complaints.  This will be her third alcohol sclerosing injection.  Review of Systems: Negative except as noted in the HPI. Denies N/V/F/Ch.  Past Medical History:  Diagnosis Date  . Anemia   . Asthma   . GERD (gastroesophageal reflux disease)   . History of chicken pox   . Hypercholesterolemia   . Hypertension   . Hypothyroidism   . Seasonal allergies   . Sleep apnea     Current Outpatient Medications:  .  atorvastatin (LIPITOR) 10 MG tablet, TAKE 1 TABLET BY MOUTH  DAILY, Disp: 90 tablet, Rfl: 3 .  CALCIUM PO, Take 300 mg by mouth 4 (four) times daily. , Disp: , Rfl:  .  levothyroxine (SYNTHROID) 75 MCG tablet, Take 1 tablet (75 mcg total) by mouth daily., Disp: 90 tablet, Rfl: 3 .  losartan (COZAAR) 100 MG tablet, TAKE 1 TABLET BY MOUTH  DAILY, Disp: 90 tablet, Rfl: 3 .  Multiple Vitamin (MULTIVITAMIN PO), Take by mouth daily.  , Disp: , Rfl:  .  pantoprazole (PROTONIX) 40 MG tablet, TAKE 1 TABLET BY MOUTH  DAILY, Disp: 90 tablet, Rfl: 3 .  venlafaxine XR (EFFEXOR-XR) 37.5 MG 24 hr capsule, TAKE 1 CAPSULE BY MOUTH  DAILY WITH BREAKFAST, Disp: 90 capsule, Rfl: 3  Social History   Tobacco Use  Smoking Status Never Smoker  Smokeless Tobacco Never Used    Allergies   Allergen Reactions  . Sulfa Antibiotics Rash    All over the body.  . Sulfasalazine Rash    All over the body.  . Codeine Nausea And Vomiting   Objective:  There were no vitals filed for this visit. There is no height or weight on file to calculate BMI. Constitutional Well developed. Well nourished.  Vascular Dorsalis pedis pulses palpable bilaterally. Posterior tibial pulses palpable bilaterally. Capillary refill normal to all digits.  No cyanosis or clubbing noted. Pedal hair growth normal.  Neurologic Normal speech. Oriented to person, place, and time. Epicritic sensation to light touch grossly present bilaterally.  Dermatologic Nails well groomed and normal in appearance. No open wounds. No skin lesions.  Orthopedic:  Left foot positive Mulder's click at the fourth interspace.  No pain with range of motion of the fifth digit on the metatarsophalangeal joint active and passive.  No intra-articular pain.  Mild pain on palpation to the lateral aspect of the fifth metatarsal head.  There is a small callus formation/hyperkeratotic lesion of the fifth metatarsal head lateral aspect.   Radiographs: None Assessment:   1. Neuroma   2. Pain in left foot    Plan:  Patient was evaluated and treated and all questions answered.  Left fourth interspace neuroma -I explained to the patient given  that the block helped decrease her pain considerably, this rules in an diagnosis for neuroma of the fourth interspace.  I explained to the patient the treatment options available including alcohol injections 4% versus surgical excision.  Patient has elected to undergo 4% alcohol injections.  Today is her third alcohol sclerosing injection.  Patient understands the risk and she would like to proceed.  After obtaining consent, and per orders of Dr. Posey Pronto, injection of left fourth interspace 4% sclerosing alcohol injection 1.5 cc given by Felipa Furnace. Patient instructed to remain in clinic for 20  minutes afterwards, and to report any adverse reaction to me immediately.   Return in about 3 weeks (around 04/29/2019), or neuroma f/u 4th.

## 2019-04-09 DIAGNOSIS — C4491 Basal cell carcinoma of skin, unspecified: Secondary | ICD-10-CM

## 2019-04-09 HISTORY — DX: Basal cell carcinoma of skin, unspecified: C44.91

## 2019-04-10 LAB — CBC WITH DIFFERENTIAL/PLATELET
Basophils Absolute: 0.1 10*3/uL (ref 0.0–0.2)
Basos: 1 %
EOS (ABSOLUTE): 0.1 10*3/uL (ref 0.0–0.4)
Eos: 2 %
Hematocrit: 41.8 % (ref 34.0–46.6)
Hemoglobin: 13.9 g/dL (ref 11.1–15.9)
Immature Grans (Abs): 0 10*3/uL (ref 0.0–0.1)
Immature Granulocytes: 0 %
Lymphocytes Absolute: 1.4 10*3/uL (ref 0.7–3.1)
Lymphs: 19 %
MCH: 28.1 pg (ref 26.6–33.0)
MCHC: 33.3 g/dL (ref 31.5–35.7)
MCV: 84 fL (ref 79–97)
Monocytes Absolute: 0.6 10*3/uL (ref 0.1–0.9)
Monocytes: 8 %
Neutrophils Absolute: 5.3 10*3/uL (ref 1.4–7.0)
Neutrophils: 70 %
Platelets: 294 10*3/uL (ref 150–450)
RBC: 4.95 x10E6/uL (ref 3.77–5.28)
RDW: 13.4 % (ref 11.7–15.4)
WBC: 7.5 10*3/uL (ref 3.4–10.8)

## 2019-04-10 LAB — LIPID PANEL
Chol/HDL Ratio: 3.4 ratio (ref 0.0–4.4)
Cholesterol, Total: 160 mg/dL (ref 100–199)
HDL: 47 mg/dL (ref >39)
LDL Chol Calc (NIH): 95 mg/dL (ref 0–99)
Triglycerides: 98 mg/dL (ref 0–149)
VLDL Cholesterol Cal: 18 mg/dL (ref 5–40)

## 2019-04-10 LAB — BASIC METABOLIC PANEL
BUN/Creatinine Ratio: 18 (ref 9–23)
BUN: 13 mg/dL (ref 6–24)
CO2: 28 mmol/L (ref 20–29)
Calcium: 9.4 mg/dL (ref 8.7–10.2)
Chloride: 104 mmol/L (ref 96–106)
Creatinine, Ser: 0.71 mg/dL (ref 0.57–1.00)
GFR calc Af Amer: 112 mL/min/{1.73_m2} (ref >59)
GFR calc non Af Amer: 97 mL/min/{1.73_m2} (ref >59)
Glucose: 87 mg/dL (ref 65–99)
Potassium: 4.8 mmol/L (ref 3.5–5.2)
Sodium: 142 mmol/L (ref 134–144)

## 2019-04-10 LAB — HEPATIC FUNCTION PANEL
ALT: 13 IU/L (ref 0–32)
AST: 18 IU/L (ref 0–40)
Albumin: 4 g/dL (ref 3.8–4.9)
Alkaline Phosphatase: 106 IU/L (ref 39–117)
Bilirubin Total: 0.3 mg/dL (ref 0.0–1.2)
Bilirubin, Direct: 0.08 mg/dL (ref 0.00–0.40)
Total Protein: 6.3 g/dL (ref 6.0–8.5)

## 2019-04-10 LAB — TSH: TSH: 3.23 u[IU]/mL (ref 0.450–4.500)

## 2019-04-11 ENCOUNTER — Encounter: Payer: Self-pay | Admitting: Internal Medicine

## 2019-04-29 ENCOUNTER — Other Ambulatory Visit: Payer: Self-pay

## 2019-04-29 ENCOUNTER — Ambulatory Visit: Payer: Managed Care, Other (non HMO) | Admitting: Podiatry

## 2019-04-29 DIAGNOSIS — M79672 Pain in left foot: Secondary | ICD-10-CM | POA: Diagnosis not present

## 2019-04-29 DIAGNOSIS — D3610 Benign neoplasm of peripheral nerves and autonomic nervous system, unspecified: Secondary | ICD-10-CM | POA: Diagnosis not present

## 2019-04-30 ENCOUNTER — Encounter: Payer: Self-pay | Admitting: Podiatry

## 2019-04-30 NOTE — Progress Notes (Signed)
Subjective:  Patient ID: Kristine Hendricks, female    DOB: 1966-01-07,  MRN: [id number]  Chief Complaint  Patient presents with  . Neuroma    pt is here for a neuroma f/u of the left foot, pt states that she is feeling a lot better since the last time she was here, pt states that her pain is a 2 out of 10, and is only elevated when walking. Pt states that she has been walking a lot more, which has helped with the foot pain    54 y.o. female presents with the above complaint.  Patient presents with a left fourth interspace neuroma injection.  Patient states her pain has very much improved.  She has been able to walk couple miles a day without any pain.  Her pain scale now is 2 out of 10.  Pain is only elevated in the morning.  She denies any other acute complaints.  She is here for her fourth injection.  Review of Systems: Negative except as noted in the HPI. Denies N/V/F/Ch.  Past Medical History:  Diagnosis Date  . Anemia   . Asthma   . GERD (gastroesophageal reflux disease)   . History of chicken pox   . Hypercholesterolemia   . Hypertension   . Hypothyroidism   . Seasonal allergies   . Sleep apnea     Current Outpatient Medications:  .  atorvastatin (LIPITOR) 10 MG tablet, TAKE 1 TABLET BY MOUTH  DAILY, Disp: 90 tablet, Rfl: 3 .  CALCIUM PO, Take 300 mg by mouth 4 (four) times daily. , Disp: , Rfl:  .  levothyroxine (SYNTHROID) 75 MCG tablet, Take 1 tablet (75 mcg total) by mouth daily., Disp: 90 tablet, Rfl: 3 .  losartan (COZAAR) 100 MG tablet, TAKE 1 TABLET BY MOUTH  DAILY, Disp: 90 tablet, Rfl: 3 .  metroNIDAZOLE (METROCREAM) 0.75 % cream, APPLY A THIN COAT TO THE FACE TWICE DAILY, Disp: , Rfl:  .  Multiple Vitamin (MULTIVITAMIN PO), Take by mouth daily.  , Disp: , Rfl:  .  mupirocin ointment (BACTROBAN) 2 %, APPLY A SMALL AMOUNT TO BIOPSY SITE TWICE DAILY UNTIL HEALED, Disp: , Rfl:  .  pantoprazole (PROTONIX) 40 MG tablet, TAKE 1 TABLET BY MOUTH  DAILY, Disp: 90 tablet,  Rfl: 3 .  triamcinolone cream (KENALOG) 0.1 %, APPLY A SMALL AMOUNT TO THE AFFECTED AREA TWICE DAILY AS NEEDED, Disp: , Rfl:  .  venlafaxine XR (EFFEXOR-XR) 37.5 MG 24 hr capsule, TAKE 1 CAPSULE BY MOUTH  DAILY WITH BREAKFAST, Disp: 90 capsule, Rfl: 3  Social History   Tobacco Use  Smoking Status Never Smoker  Smokeless Tobacco Never Used    Allergies  Allergen Reactions  . Sulfa Antibiotics Rash    All over the body.  . Sulfasalazine Rash    All over the body.  . Codeine Nausea And Vomiting   Objective:  There were no vitals filed for this visit. There is no height or weight on file to calculate BMI. Constitutional Well developed. Well nourished.  Vascular Dorsalis pedis pulses palpable bilaterally. Posterior tibial pulses palpable bilaterally. Capillary refill normal to all digits.  No cyanosis or clubbing noted. Pedal hair growth normal.  Neurologic Normal speech. Oriented to person, place, and time. Epicritic sensation to light touch grossly present bilaterally.  Dermatologic Nails well groomed and normal in appearance. No open wounds. No skin lesions.  Orthopedic:  Left foot positive Mulder's click at the fourth interspace.  No pain with range of  motion of the fifth digit on the metatarsophalangeal joint active and passive.  No intra-articular pain.  Mild pain on palpation to the lateral aspect of the fifth metatarsal head.  There is a small callus formation/hyperkeratotic lesion of the fifth metatarsal head lateral aspect.   Radiographs: None Assessment:   No diagnosis found. Plan:  Patient was evaluated and treated and all questions answered.  Left fourth interspace neuroma -I explained to the patient given that the block helped decrease her pain considerably, this rules in an diagnosis for neuroma of the fourth interspace.  I explained to the patient the treatment options available including alcohol injections 4% versus surgical excision.  Patient has elected to  undergo 4% alcohol injections.  Today is her fourth alcohol sclerosing injection.  Patient understands the risk and she would like to proceed.  After obtaining consent, and per orders of Dr. Posey Pronto, injection of left fourth interspace 4% sclerosing alcohol injection 1.5 cc given by Felipa Furnace. Patient instructed to remain in clinic for 20 minutes afterwards, and to report any adverse reaction to me immediately.   No follow-ups on file.

## 2019-05-20 ENCOUNTER — Other Ambulatory Visit: Payer: Self-pay

## 2019-05-20 ENCOUNTER — Ambulatory Visit: Payer: Managed Care, Other (non HMO) | Admitting: Podiatry

## 2019-05-20 DIAGNOSIS — D3610 Benign neoplasm of peripheral nerves and autonomic nervous system, unspecified: Secondary | ICD-10-CM | POA: Diagnosis not present

## 2019-05-20 DIAGNOSIS — M79672 Pain in left foot: Secondary | ICD-10-CM | POA: Diagnosis not present

## 2019-05-20 DIAGNOSIS — M21622 Bunionette of left foot: Secondary | ICD-10-CM | POA: Diagnosis not present

## 2019-05-21 ENCOUNTER — Encounter: Payer: Self-pay | Admitting: Podiatry

## 2019-05-21 NOTE — Progress Notes (Signed)
Subjective:  Patient ID: Kristine Hendricks, female    DOB: [birth year],  MRN: [id number]  Chief Complaint  Patient presents with  . Neuroma    pt is here for a 3 week possible neuroma injection, pt states that she is doing a lot better since the last time she was here, pt has little to no pain, pain scale is a 1 out of 10, Pt might be here for her 5th injection of dehydrated alcohol    54 y.o. female presents with the above complaint. Patient presents with left fourth interspace neuroma. Patient states that she does not have any more pain. Her pain scale is barely a 1 out of 10 and only if she has been walking for a long period of time. Patient would like to know if this could be her last injection or if she does not need anymore injections. She has no problem walking she denies any other acute complaints.  Review of Systems: Negative except as noted in the HPI. Denies N/V/F/Ch.  Past Medical History:  Diagnosis Date  . Anemia   . Asthma   . GERD (gastroesophageal reflux disease)   . History of chicken pox   . Hypercholesterolemia   . Hypertension   . Hypothyroidism   . Seasonal allergies   . Sleep apnea     Current Outpatient Medications:  .  atorvastatin (LIPITOR) 10 MG tablet, TAKE 1 TABLET BY MOUTH  DAILY, Disp: 90 tablet, Rfl: 3 .  CALCIUM PO, Take 300 mg by mouth 4 (four) times daily. , Disp: , Rfl:  .  levothyroxine (SYNTHROID) 75 MCG tablet, Take 1 tablet (75 mcg total) by mouth daily., Disp: 90 tablet, Rfl: 3 .  losartan (COZAAR) 100 MG tablet, TAKE 1 TABLET BY MOUTH  DAILY, Disp: 90 tablet, Rfl: 3 .  metroNIDAZOLE (METROCREAM) 0.75 % cream, APPLY A THIN COAT TO THE FACE TWICE DAILY, Disp: , Rfl:  .  Multiple Vitamin (MULTIVITAMIN PO), Take by mouth daily.  , Disp: , Rfl:  .  mupirocin ointment (BACTROBAN) 2 %, APPLY A SMALL AMOUNT TO BIOPSY SITE TWICE DAILY UNTIL HEALED, Disp: , Rfl:  .  pantoprazole (PROTONIX) 40 MG tablet, TAKE 1 TABLET BY MOUTH  DAILY, Disp: 90 tablet,  Rfl: 3 .  triamcinolone cream (KENALOG) 0.1 %, APPLY A SMALL AMOUNT TO THE AFFECTED AREA TWICE DAILY AS NEEDED, Disp: , Rfl:  .  venlafaxine XR (EFFEXOR-XR) 37.5 MG 24 hr capsule, TAKE 1 CAPSULE BY MOUTH  DAILY WITH BREAKFAST, Disp: 90 capsule, Rfl: 3  Social History   Tobacco Use  Smoking Status Never Smoker  Smokeless Tobacco Never Used    Allergies  Allergen Reactions  . Sulfa Antibiotics Rash    All over the body.  . Sulfasalazine Rash    All over the body.  . Codeine Nausea And Vomiting   Objective:  There were no vitals filed for this visit. There is no height or weight on file to calculate BMI. Constitutional Well developed. Well nourished.  Vascular Dorsalis pedis pulses palpable bilaterally. Posterior tibial pulses palpable bilaterally. Capillary refill normal to all digits.  No cyanosis or clubbing noted. Pedal hair growth normal.  Neurologic Normal speech. Oriented to person, place, and time. Epicritic sensation to light touch grossly present bilaterally.  Dermatologic Nails well groomed and normal in appearance. No open wounds. No skin lesions.  Orthopedic:  Negative Mulder's click on the fourth interspace. No pain with any range of motion of any digits. No pain on  palpation to the fourth interspace.    Radiographs: None Assessment:   1. Neuroma   2. Pain in left foot   3. Tailor's bunion of left foot    Plan:  Patient was evaluated and treated and all questions answered.  Left fourth interspace neuroma -Completely resolved after 4 injections. I discussed extensively with the patient that given that she has had complete pain relief without even any signs of any nerve related pain, I believe that we can stop with the alcohol injections at this time. I did explain to the patient that I like to do 6 to 7 injections for this can sometimes there is a remanent nerve associated with it that could possibly come back. However given that she has had clinical resolve  meant of the pain I feel comfortable stopping at this time. I have asked the patient to come back and see me if her pain comes back. Patient states understanding and will do so.   No follow-ups on file.

## 2019-06-20 ENCOUNTER — Other Ambulatory Visit: Payer: Self-pay | Admitting: Internal Medicine

## 2019-07-17 ENCOUNTER — Encounter: Payer: Managed Care, Other (non HMO) | Admitting: Internal Medicine

## 2019-08-15 ENCOUNTER — Other Ambulatory Visit: Payer: Self-pay

## 2019-08-15 ENCOUNTER — Ambulatory Visit (INDEPENDENT_AMBULATORY_CARE_PROVIDER_SITE_OTHER): Payer: Managed Care, Other (non HMO) | Admitting: Internal Medicine

## 2019-08-15 VITALS — BP 128/72 | HR 68 | Temp 97.1°F | Resp 16 | Ht 64.0 in | Wt 326.0 lb

## 2019-08-15 DIAGNOSIS — E039 Hypothyroidism, unspecified: Secondary | ICD-10-CM

## 2019-08-15 DIAGNOSIS — Z0000 Encounter for general adult medical examination without abnormal findings: Secondary | ICD-10-CM | POA: Diagnosis not present

## 2019-08-15 DIAGNOSIS — Z6841 Body Mass Index (BMI) 40.0 and over, adult: Secondary | ICD-10-CM

## 2019-08-15 DIAGNOSIS — E7800 Pure hypercholesterolemia, unspecified: Secondary | ICD-10-CM | POA: Diagnosis not present

## 2019-08-15 DIAGNOSIS — K2100 Gastro-esophageal reflux disease with esophagitis, without bleeding: Secondary | ICD-10-CM

## 2019-08-15 DIAGNOSIS — H938X9 Other specified disorders of ear, unspecified ear: Secondary | ICD-10-CM

## 2019-08-15 DIAGNOSIS — R21 Rash and other nonspecific skin eruption: Secondary | ICD-10-CM

## 2019-08-15 DIAGNOSIS — I10 Essential (primary) hypertension: Secondary | ICD-10-CM | POA: Diagnosis not present

## 2019-08-15 MED ORDER — AZELASTINE HCL 0.1 % NA SOLN: 1.0000 | Freq: Two times a day (BID) | NASAL | 1 refills | Status: DC

## 2019-08-15 NOTE — Progress Notes (Signed)
Patient ID: Kristine Hendricks, female   DOB: 1965-11-30, 54 y.o.   MRN: [id number]   Subjective:    Patient ID: Kristine Hendricks, female    DOB: 04/22/1965, 54 y.o.   MRN: [id number]  HPI This visit occurred during the SARS-CoV-2 public health emergency.  Safety protocols were in place, including screening questions prior to the visit, additional usage of staff PPE, and extensive cleaning of exam room while observing appropriate contact time as indicated for disinfecting solutions.  Patient here for her physical exam.  She reports she is doing relatively well.  Working from home.  Has started walking.  Discussed diet and exercise.  No chest pain or sob with increased activity or exertion.  Some right ear issues. No ear pain.  Lying down - some drainage - nose.  No acid reflux or abdominal pain reported.  Bowels stable.  Seeing Dr Posey Pronto - neuroma.  See dermatology.  Has metrocream.   Past Medical History:  Diagnosis Date  . Anemia   . Asthma   . GERD (gastroesophageal reflux disease)   . History of chicken pox   . Hypercholesterolemia   . Hypertension   . Hypothyroidism   . Seasonal allergies   . Sleep apnea    Past Surgical History:  Procedure Laterality Date  . arthroscopic surgery     Dr Sabra Heck  . BREAST CYST ASPIRATION Right 2005  . BREAST SURGERY  4 /05   biopsy  . CHOLECYSTECTOMY  2007  . COLONOSCOPY WITH PROPOFOL N/A 02/14/2016   Procedure: COLONOSCOPY WITH PROPOFOL;  Surgeon: Manya Silvas, MD;  Location: [facility] ENDOSCOPY;  Service: Endoscopy;  Laterality: N/A;  . DILATION AND CURETTAGE OF UTERUS  8/04  . LAPAROSCOPIC GASTRIC BANDING  01/29/09  . MYOMECTOMY  9/04  . VAGINAL HYSTERECTOMY  06/11/03   Family History  Problem Relation Age of Onset  . Lung cancer Mother   . Arthritis Mother   . Stroke Mother   . Hypertension Mother   . Colon polyps Mother   . Thyroid disease Sister        graves disease  . Colon cancer Other        maternal grandparent  .  Hyperlipidemia Father   . Stroke Maternal Aunt   . Stroke Maternal Uncle   . Cancer Maternal Grandmother        colon  . Alcohol abuse Cousin   . Breast cancer Neg Hx   . Heart disease Neg Hx    Social History   Socioeconomic History  . Marital status: Single    Spouse name: Not on file  . Number of children: 0  . Years of education: Not on file  . Highest education level: Not on file  Occupational History    Employer: [employer]  Tobacco Use  . Smoking status: Never Smoker  . Smokeless tobacco: Never Used  Substance and Sexual Activity  . Alcohol use: No    Alcohol/week: 0.0 standard drinks    Comment: 1 drink/month  . Drug use: No  . Sexual activity: Not on file  Other Topics Concern  . Not on file  Social History Narrative  . Not on file   Social Determinants of Health   Financial Resource Strain:   . Difficulty of Paying Living Expenses:   Food Insecurity:   . Worried About Charity fundraiser in the Last Year:   . Arboriculturist in the Last Year:   Transportation Needs:   .  Lack of Transportation (Medical):   Marland Kitchen Lack of Transportation (Non-Medical):   Physical Activity:   . Days of Exercise per Week:   . Minutes of Exercise per Session:   Stress:   . Feeling of Stress :   Social Connections:   . Frequency of Communication with Friends and Family:   . Frequency of Social Gatherings with Friends and Family:   . Attends Religious Services:   . Active Member of Clubs or Organizations:   . Attends Archivist Meetings:   Marland Kitchen Marital Status:     Outpatient Encounter Medications as of 08/15/2019  Medication Sig  . atorvastatin (LIPITOR) 10 MG tablet TAKE 1 TABLET BY MOUTH  DAILY  . CALCIUM PO Take 300 mg by mouth 4 (four) times daily.   Marland Kitchen levothyroxine (SYNTHROID) 75 MCG tablet TAKE 1 TABLET(75 MCG) BY MOUTH DAILY  . losartan (COZAAR) 100 MG tablet TAKE 1 TABLET BY MOUTH  DAILY  . metroNIDAZOLE (METROCREAM) 0.75 % cream APPLY A THIN COAT TO THE FACE  TWICE DAILY  . Multiple Vitamin (MULTIVITAMIN PO) Take by mouth daily.    . pantoprazole (PROTONIX) 40 MG tablet TAKE 1 TABLET BY MOUTH  DAILY  . triamcinolone cream (KENALOG) 0.1 % APPLY A SMALL AMOUNT TO THE AFFECTED AREA TWICE DAILY AS NEEDED  . venlafaxine XR (EFFEXOR-XR) 37.5 MG 24 hr capsule TAKE 1 CAPSULE BY MOUTH  DAILY WITH BREAKFAST  . azelastine (ASTELIN) 0.1 % nasal spray Place 1 spray into both nostrils 2 (two) times daily. Use in each nostril as directed  . [DISCONTINUED] mupirocin ointment (BACTROBAN) 2 % APPLY A SMALL AMOUNT TO BIOPSY SITE TWICE DAILY UNTIL HEALED   No facility-administered encounter medications on file as of 08/15/2019.    Review of Systems  Constitutional: Negative for appetite change.       Gained some weight since last visit.    HENT: Positive for congestion. Negative for sinus pressure.        Nasal drainage.    Eyes: Negative for pain and visual disturbance.  Respiratory: Negative for cough, chest tightness and shortness of breath.   Cardiovascular: Negative for chest pain, palpitations and leg swelling.  Gastrointestinal: Negative for abdominal pain, diarrhea, nausea and vomiting.  Genitourinary: Negative for difficulty urinating and dysuria.  Musculoskeletal: Negative for joint swelling and myalgias.  Skin: Negative for color change and rash.  Neurological: Negative for dizziness, light-headedness and headaches.  Hematological: Negative for adenopathy. Does not bruise/bleed easily.  Psychiatric/Behavioral: Negative for agitation and dysphoric mood.       Objective:    Physical Exam Constitutional:      General: She is not in acute distress.    Appearance: Normal appearance. She is well-developed.  HENT:     Head: Normocephalic and atraumatic.     Right Ear: External ear normal.     Left Ear: External ear normal.  Eyes:     General: No scleral icterus.       Right eye: No discharge.        Left eye: No discharge.      Conjunctiva/sclera: Conjunctivae normal.  Neck:     Thyroid: No thyromegaly.  Cardiovascular:     Rate and Rhythm: Normal rate and regular rhythm.  Pulmonary:     Effort: No tachypnea, accessory muscle usage or respiratory distress.     Breath sounds: Normal breath sounds. No decreased breath sounds or wheezing.  Chest:     Breasts:  Right: No inverted nipple, mass, nipple discharge or tenderness (no axillary adenopathy).        Left: No inverted nipple, mass, nipple discharge or tenderness (no axilarry adenopathy).  Abdominal:     General: Bowel sounds are normal.     Palpations: Abdomen is soft.     Tenderness: There is no abdominal tenderness.  Musculoskeletal:        General: No swelling or tenderness.     Cervical back: Neck supple. No tenderness.  Lymphadenopathy:     Cervical: No cervical adenopathy.  Skin:    Findings: No erythema or rash.  Neurological:     Mental Status: She is alert and oriented to person, place, and time.  Psychiatric:        Mood and Affect: Mood normal.        Behavior: Behavior normal.     BP 128/72   Pulse 68   Temp (!) 97.1 F (36.2 C)   Resp 16   Ht 5\' 4"  (1.626 m)   Wt (!) 326 lb (147.9 kg)   LMP 06/11/2003   SpO2 98%   BMI 55.96 kg/m  Wt Readings from Last 3 Encounters:  08/15/19 (!) 326 lb (147.9 kg)  03/11/19 (!) 316 lb (143.3 kg)  01/29/18 (!) 316 lb (143.3 kg)     Lab Results  Component Value Date   WBC 7.5 04/09/2019   HGB 13.9 04/09/2019   HCT 41.8 04/09/2019   PLT 294 04/09/2019   GLUCOSE 87 04/09/2019   CHOL 160 04/09/2019   TRIG 98 04/09/2019   HDL 47 04/09/2019   LDLCALC 95 04/09/2019   ALT 13 04/09/2019   AST 18 04/09/2019   NA 142 04/09/2019   K 4.8 04/09/2019   CL 104 04/09/2019   CREATININE 0.71 04/09/2019   BUN 13 04/09/2019   CO2 28 04/09/2019   TSH 3.230 04/09/2019   HGBA1C 5.3 03/19/2015    MM 3D SCREEN BREAST BILATERAL  Result Date: 01/03/2019 CLINICAL DATA:  Screening. EXAM:  DIGITAL SCREENING BILATERAL MAMMOGRAM WITH TOMO AND CAD COMPARISON:  Previous exam(s). ACR Breast Density Category a: The breast tissue is almost entirely fatty. FINDINGS: There are no findings suspicious for malignancy. Images were processed with CAD. IMPRESSION: No mammographic evidence of malignancy. A result letter of this screening mammogram will be mailed directly to the patient. RECOMMENDATION: Screening mammogram in one year. (Code:SM-B-01Y) BI-RADS CATEGORY  1: Negative. Electronically Signed   By: Audie Pinto M.D.   On: 01/03/2019 13:49       Assessment & Plan:   Problem List Items Addressed This Visit    BMI 50.0-59.9, adult ([place])    Discussed diet and exercise.  Follow.        Ear fullness    Some right ear issues as outlined.  Some drainage.  Astelin. Flonase nasal spray.  Tympanic membrane - clear.  Follow.        GERD (gastroesophageal reflux disease)    Controlled on protonix.        Health care maintenance    Physical today 08/15/19.  Mammogram 01/13/20 - Birads I.  Colonoscopy 01/2016.  Schedule f/u in 5 years.        Hypercholesterolemia - Primary    On lipitor.  Low cholesterol diet and exercise.  Follow lipid panel and liver function tests.        Relevant Orders   Lipid panel   Hepatic function panel   Hypertension    Blood pressure as  outlined.  Doing well.  Continue losartan.  Follow pressures.  Follow metabolic panel.       Relevant Orders   Basic metabolic panel   Hypothyroid    On thyroid replacement.  Follow tsh.        Relevant Orders   TSH   Rash    Seeing dermatology.  Had metrocream.            Einar Pheasant, MD

## 2019-08-15 NOTE — Patient Instructions (Signed)
flonase nasal spray - 2 sprays each nostril one time per day.  Do this in the evening.    astelin nasal spray - 1 spray each nostril 2x/day

## 2019-08-24 ENCOUNTER — Encounter: Payer: Self-pay | Admitting: Internal Medicine

## 2019-08-24 NOTE — Assessment & Plan Note (Signed)
On thyroid replacement.  Follow tsh.  

## 2019-08-24 NOTE — Assessment & Plan Note (Signed)
Seeing dermatology.  Had metrocream.

## 2019-08-24 NOTE — Assessment & Plan Note (Signed)
Discussed diet and exercise.  Follow.  

## 2019-08-24 NOTE — Assessment & Plan Note (Signed)
Blood pressure as outlined.  Doing well.  Continue losartan.  Follow pressures.  Follow metabolic panel.  

## 2019-08-24 NOTE — Assessment & Plan Note (Signed)
On lipitor.  Low cholesterol diet and exercise.  Follow lipid panel and liver function tests.   

## 2019-08-24 NOTE — Assessment & Plan Note (Signed)
Some right ear issues as outlined.  Some drainage.  Astelin. Flonase nasal spray.  Tympanic membrane - clear.  Follow.

## 2019-08-24 NOTE — Assessment & Plan Note (Signed)
Controlled on protonix.   

## 2019-08-24 NOTE — Assessment & Plan Note (Addendum)
Physical today 08/15/19.  Mammogram 01/13/20 - Birads I.  Colonoscopy 01/2016.  Schedule f/u in 5 years.

## 2019-09-23 ENCOUNTER — Other Ambulatory Visit: Payer: Self-pay | Admitting: Internal Medicine

## 2019-11-06 ENCOUNTER — Ambulatory Visit: Payer: Managed Care, Other (non HMO) | Admitting: Dermatology

## 2019-11-06 ENCOUNTER — Other Ambulatory Visit: Payer: Self-pay

## 2019-11-06 DIAGNOSIS — L308 Other specified dermatitis: Secondary | ICD-10-CM

## 2019-11-06 DIAGNOSIS — L821 Other seborrheic keratosis: Secondary | ICD-10-CM

## 2019-11-06 DIAGNOSIS — Z1283 Encounter for screening for malignant neoplasm of skin: Secondary | ICD-10-CM

## 2019-11-06 DIAGNOSIS — D229 Melanocytic nevi, unspecified: Secondary | ICD-10-CM | POA: Diagnosis not present

## 2019-11-06 DIAGNOSIS — D235 Other benign neoplasm of skin of trunk: Secondary | ICD-10-CM

## 2019-11-06 DIAGNOSIS — L719 Rosacea, unspecified: Secondary | ICD-10-CM

## 2019-11-06 DIAGNOSIS — L814 Other melanin hyperpigmentation: Secondary | ICD-10-CM

## 2019-11-06 DIAGNOSIS — L578 Other skin changes due to chronic exposure to nonionizing radiation: Secondary | ICD-10-CM

## 2019-11-06 DIAGNOSIS — D1800 Hemangioma unspecified site: Secondary | ICD-10-CM

## 2019-11-06 DIAGNOSIS — Z85828 Personal history of other malignant neoplasm of skin: Secondary | ICD-10-CM

## 2019-11-06 MED ORDER — CLOBETASOL PROPIONATE 0.05 % EX OINT: TOPICAL_OINTMENT | CUTANEOUS | 0 refills | Status: DC

## 2019-11-06 MED ORDER — IVERMECTIN 1 % EX CREA: TOPICAL_CREAM | CUTANEOUS | 5 refills | Status: DC

## 2019-11-06 MED ORDER — TRIAMCINOLONE ACETONIDE 0.1 % EX OINT: TOPICAL_OINTMENT | CUTANEOUS | 0 refills | Status: DC

## 2019-11-06 NOTE — Progress Notes (Signed)
Follow-Up Visit   Subjective  Kristine Hendricks is a 54 y.o. female who presents for the following: Annual Exam (Patient here for TBSE. She does have a history of BCC. ).  Patient advises she has had a rash at the chest and upper arms for about 3 months that is sometimes itchy. She has not used anything on it.   The following portions of the chart were reviewed this encounter and updated as appropriate:  Tobacco  Allergies  Meds  Problems  Med Hx  Surg Hx  Fam Hx      Review of Systems:  No other skin or systemic complaints except as noted in HPI or Assessment and Plan.  Objective  Well appearing patient in no apparent distress; mood and affect are within normal limits.  A full examination was performed including scalp, head, eyes, ears, nose, lips, neck, chest, axillae, abdomen, back, buttocks, bilateral upper extremities, bilateral lower extremities, hands, feet, fingers, toes, fingernails, and toenails. All findings within normal limits unless otherwise noted below.  Objective  Chest, upper arms: Many coalescing pink papules at upper arms and chest   Hands: Pink papular vesicles  Objective  face: Mid face erythema especially at nose with scattered inflammatory papules and pustules.    Assessment & Plan  Other eczema (2) Hands; Chest, upper arms  Papular at chest and upper arms Dyshidrotic at hands  Start TMC 0.1% ointment twice daily to affected areas chest, upper arms as needed for rash. Avoid face, groin, underarms. #[id number]  Start clobetasol 0.05% ointment to hands twice daily to affected areas hands as needed for rash. Avoid face, groin, underarms. #80 0RF  Topical steroids (such as triamcinolone, fluocinolone, fluocinonide, mometasone, clobetasol, halobetasol, betamethasone, hydrocortisone) can cause thinning and lightening of the skin if they are used for too long in the same area. Your physician has selected the right strength medicine for your problem and  area affected on the body. Please use your medication only as directed by your physician to prevent side effects.     triamcinolone ointment (KENALOG) 0.1 % - Chest, upper arms  clobetasol ointment (TEMOVATE) 0.05 % - Hands  Rosacea face  Start Soolantra once daily to face  If not covered and expensive, will send in [facility] metronidazole/ivermectin/azelaic acid  Ivermectin (SOOLANTRA) 1 % CREA - face   Lentigines - Scattered tan macules - Discussed due to sun exposure - Benign, observe - Call for any changes  Seborrheic Keratoses - Stuck-on, waxy, tan-brown papules and plaques  - Discussed benign etiology and prognosis. - Observe - Call for any changes  Melanocytic Nevi - Tan-brown and/or pink-flesh-colored symmetric macules and papules - Benign appearing on exam today - Observation - Call clinic for new or changing moles - Recommend daily use of broad spectrum spf 30+ sunscreen to sun-exposed areas.   Hemangiomas - Red papules - Discussed benign nature - Observe - Call for any changes  Actinic Damage - diffuse scaly erythematous macules with underlying dyspigmentation - Recommend daily broad spectrum sunscreen SPF 30+ to sun-exposed areas, reapply every 2 hours as needed.  - Call for new or changing lesions.  Skin cancer screening performed today.  History of Basal Cell Carcinoma of the Skin - No evidence of recurrence today left lateral calf - Recommend regular full body skin exams - Recommend daily broad spectrum sunscreen SPF 30+ to sun-exposed areas, reapply every 2 hours as needed.  - Call if any new or changing lesions are noted between office visits -  recommend Nicotinamide  Nevus Spilus - Brown macules or papules within lighter tan patch at back - Genetic - Benign, observe - Call for any changes   Return in about 1 month (around 12/07/2019) for rash and rosacea follow up, 6 months for TBSE.  Graciella Belton, RMA, am acting as scribe  for Forest Gleason, MD .  Documentation: I have reviewed the above documentation for accuracy and completeness, and I agree with the above.  Forest Gleason, MD

## 2019-11-06 NOTE — Patient Instructions (Addendum)
Topical steroids (such as triamcinolone, fluocinolone, fluocinonide, mometasone, clobetasol, halobetasol, betamethasone, hydrocortisone) can cause thinning and lightening of the skin if they are used for too long in the same area. Your physician has selected the right strength medicine for your problem and area affected on the body. Please use your medication only as directed by your physician to prevent side effects.   Melanoma ABCDEs  Melanoma is the most dangerous type of skin cancer, and is the leading cause of death from skin disease.  You are more likely to develop melanoma if you:  Have light-colored skin, light-colored eyes, or red or blond hair  Spend a lot of time in the sun  Tan regularly, either outdoors or in a tanning bed  Have had blistering sunburns, especially during childhood  Have a close family member who has had a melanoma  Have atypical moles or large birthmarks  Early detection of melanoma is key since treatment is typically straightforward and cure rates are extremely high if we catch it early.   The first sign of melanoma is often a change in a mole or a new dark spot.  The ABCDE system is a way of remembering the signs of melanoma.  A for asymmetry:  The two halves do not match. B for border:  The edges of the growth are irregular. C for color:  A mixture of colors are present instead of an even brown color. D for diameter:  Melanomas are usually (but not always) greater than 72mm - the size of a pencil eraser. E for evolution:  The spot keeps changing in size, shape, and color.  Please check your skin once per month between visits. You can use a small mirror in front and a large mirror behind you to keep an eye on the back side or your body.   If you see any new or changing lesions before your next follow-up, please call to schedule a visit.  Please continue daily skin protection including broad spectrum sunscreen SPF 30+ to sun-exposed areas, reapplying  every 2 hours as needed when you're outdoors.   Recommend taking Heliocare sun protection supplement daily in sunny weather for additional sun protection. For maximum protection on the sunniest days, you can take up to 2 capsules of regular Heliocare OR take 1 capsule of Heliocare Ultra. For prolonged exposure (such as a full day in the sun), you can repeat your dose of the supplement 4 hours after your first dose. Heliocare can be purchased at [facility] or at [email or web address].   Rosacea  What is rosacea? Rosacea (say: ro-zay-sha) is a common skin disease that usually begins as a trend of flushing or blushing easily.  As rosacea progresses, a persistent redness in the center of the face will develop and may gradually spread beyond the nose and cheeks to the forehead and chin.  In some cases, the ears, chest, and back could be affected.  Rosacea may appear as tiny blood vessels or small red bumps that occur in crops.  Frequently they can contain pus, and are called "pustules".  If the bumps do not contain pus, they are referred to as "papules".  Rarely, in prolonged, untreated cases of rosacea, the oil glands of the nose and cheeks may become permanently enlarged.  This is called rhinophyma, and is seen more frequently in men.  Signs and Risks In its beginning stages, rosacea tends to come and go, which makes it difficult to recognize.  It can start as intermittent flushing  of the face.  Eventually, blood vessels may become permanently visible.  Pustules and papules can appear, but can be mistaken for adult acne.  People of all races, ages, genders and ethnic groups are at risk of developing rosacea.  However, it is more common in women (especially around menopause) and adults with fair skin between the ages of 5 and 62.  Treatment Dermatologists typically recommend a combination of treatments to effectively manage rosacea.  Treatment can improve symptoms and may stop the progression of the  rosacea.  Treatment may involve both topical and oral medications.  The tetracycline antibiotics are often used for their anti-inflammatory effect; however, because of the possibility of developing antibiotic resistance, they should not be used long term at full dose.  For dilated blood vessels the options include electrodessication (uses electric current through a small needle), laser treatment, and cosmetics to hide the redness.   With all forms of treatment, improvement is a slow process, and patients may not see any results for the first 3-4 weeks.  It is very important to avoid the sun and other triggers.  Patients must wear sunscreen daily.  Skin Care Instructions: 1. Cleanse the skin with a mild soap such as CeraVe cleanser, Cetaphil cleanser, or Dove soap once or twice daily as needed. 2. Moisturize with Eucerin Redness Relief Daily Perfecting Lotion (has a subtle green tint), CeraVe Moisturizing Cream, or Oil of Olay Daily Moisturizer with sunscreen every morning and/or night as recommended. 3. Makeup should be "non-comedogenic" (won't clog pores) and be labeled "for sensitive skin". Good choices for cosmetics are: Neutrogena, Almay, and Physician's Formula.  Any product with a green tint tends to offset a red complexion. 4. If your eyes are dry and irritated, use artificial tears 2-3 times per day and cleanse the eyelids daily with baby shampoo.  Have your eyes examined at least every 2 years.  Be sure to tell your eye doctor that you have rosacea. 5. Alcoholic beverages tend to cause flushing of the skin, and may make rosacea worse. 6. Always wear sunscreen, protect your skin from extreme hot and cold temperatures, and avoid spicy foods, hot drinks, and mechanical irritation such as rubbing, scrubbing, or massaging the face.  Avoid harsh skin cleansers, cleansing masks, astringents, and exfoliation. If a particular product burns or makes your face feel tight, then it is likely to flare your  rosacea. 7. If you are having difficulty finding a sunscreen that you can tolerate, you may try switching to a chemical-free sunscreen.  These are ones whose active ingredient is zinc oxide or titanium dioxide only.  They should also be fragrance free, non-comedogenic, and labeled for sensitive skin. 8. Rosacea triggers may vary from person to person.  There are a variety of foods that have been reported to trigger rosacea.  Some patients find that keeping a diary of what they were doing when they flared helps them avoid triggers.  Recommend Nicotinamide 500mg  twice per day to lower risk of non-melanoma skin cancer by approximately 25%.

## 2019-11-17 ENCOUNTER — Encounter: Payer: Self-pay | Admitting: Dermatology

## 2019-11-28 ENCOUNTER — Telehealth: Payer: Self-pay | Admitting: Internal Medicine

## 2019-11-28 NOTE — Telephone Encounter (Signed)
Form placed in forms folder. 

## 2019-11-28 NOTE — Telephone Encounter (Signed)
Patient's husband dropped off appeal form. Form is up front in Dr. Bary Leriche folder.

## 2019-11-29 NOTE — Telephone Encounter (Signed)
Form completed and placed in box.  Please notify pt.

## 2019-12-03 NOTE — Telephone Encounter (Signed)
Following up on form that was dropped off at the same time as his form. His is completed and her form didn't get completed. He would like a call when it is ready for pick up.

## 2019-12-10 ENCOUNTER — Ambulatory Visit: Payer: Managed Care, Other (non HMO) | Admitting: Dermatology

## 2019-12-10 NOTE — Telephone Encounter (Signed)
Husband is coming to pick up form

## 2019-12-15 ENCOUNTER — Telehealth: Payer: Managed Care, Other (non HMO) | Admitting: Internal Medicine

## 2019-12-20 LAB — BASIC METABOLIC PANEL
BUN/Creatinine Ratio: 21 (ref 9–23)
BUN: 17 mg/dL (ref 6–24)
CO2: 27 mmol/L (ref 20–29)
Calcium: 9.7 mg/dL (ref 8.7–10.2)
Chloride: 102 mmol/L (ref 96–106)
Creatinine, Ser: 0.81 mg/dL (ref 0.57–1.00)
GFR calc Af Amer: 95 mL/min/{1.73_m2} (ref >59)
GFR calc non Af Amer: 82 mL/min/{1.73_m2} (ref >59)
Glucose: 89 mg/dL (ref 65–99)
Potassium: 4.6 mmol/L (ref 3.5–5.2)
Sodium: 142 mmol/L (ref 134–144)

## 2019-12-20 LAB — HEPATIC FUNCTION PANEL
ALT: 13 IU/L (ref 0–32)
AST: 18 IU/L (ref 0–40)
Albumin: 4.3 g/dL (ref 3.8–4.9)
Alkaline Phosphatase: 97 IU/L (ref 44–121)
Bilirubin Total: 0.4 mg/dL (ref 0.0–1.2)
Bilirubin, Direct: 0.11 mg/dL (ref 0.00–0.40)
Total Protein: 6.2 g/dL (ref 6.0–8.5)

## 2019-12-20 LAB — TSH: TSH: 0.678 u[IU]/mL (ref 0.450–4.500)

## 2019-12-20 LAB — LIPID PANEL
Chol/HDL Ratio: 3.8 ratio (ref 0.0–4.4)
Cholesterol, Total: 169 mg/dL (ref 100–199)
HDL: 45 mg/dL (ref >39)
LDL Chol Calc (NIH): 103 mg/dL — ABNORMAL HIGH (ref 0–99)
Triglycerides: 117 mg/dL (ref 0–149)
VLDL Cholesterol Cal: 21 mg/dL (ref 5–40)

## 2019-12-23 ENCOUNTER — Telehealth: Payer: Managed Care, Other (non HMO) | Admitting: Internal Medicine

## 2019-12-23 ENCOUNTER — Encounter: Payer: Self-pay | Admitting: Internal Medicine

## 2019-12-26 ENCOUNTER — Encounter: Payer: Self-pay | Admitting: Dermatology

## 2019-12-30 ENCOUNTER — Ambulatory Visit: Payer: Managed Care, Other (non HMO) | Admitting: Dermatology

## 2019-12-31 ENCOUNTER — Other Ambulatory Visit: Payer: Self-pay | Admitting: Internal Medicine

## 2020-01-05 ENCOUNTER — Other Ambulatory Visit: Payer: Self-pay | Admitting: Internal Medicine

## 2020-01-23 ENCOUNTER — Other Ambulatory Visit: Payer: Self-pay | Admitting: Internal Medicine

## 2020-01-23 DIAGNOSIS — Z1231 Encounter for screening mammogram for malignant neoplasm of breast: Secondary | ICD-10-CM

## 2020-01-27 ENCOUNTER — Telehealth (INDEPENDENT_AMBULATORY_CARE_PROVIDER_SITE_OTHER): Payer: Managed Care, Other (non HMO) | Admitting: Internal Medicine

## 2020-01-27 ENCOUNTER — Other Ambulatory Visit: Payer: Self-pay

## 2020-01-27 VITALS — BP 122/84 | Ht 64.0 in | Wt 312.0 lb

## 2020-01-27 DIAGNOSIS — I10 Essential (primary) hypertension: Secondary | ICD-10-CM

## 2020-01-27 DIAGNOSIS — Z789 Other specified health status: Secondary | ICD-10-CM

## 2020-01-27 DIAGNOSIS — E039 Hypothyroidism, unspecified: Secondary | ICD-10-CM

## 2020-01-27 DIAGNOSIS — K2100 Gastro-esophageal reflux disease with esophagitis, without bleeding: Secondary | ICD-10-CM

## 2020-01-27 DIAGNOSIS — E7800 Pure hypercholesterolemia, unspecified: Secondary | ICD-10-CM

## 2020-01-27 NOTE — Progress Notes (Signed)
Patient ID: Kristine Hendricks, female   DOB: [birth year], 54 y.o.   MRN: [id number]   Virtual Visit via video Note  This visit type was conducted due to national recommendations for restrictions regarding the COVID-19 pandemic (e.g. social distancing).  This format is felt to be most appropriate for this patient at this time.  All issues noted in this document were discussed and addressed.  No physical exam was performed (except for noted visual exam findings with Video Visits).   I connected with [name] by a video enabled telemedicine application and verified that I am speaking with the correct person using two identifiers. Location patient: home Location provider: work  Persons participating in the virtual visit: patient, provider  The limitations, risks, security and privacy concerns of performing an evaluation and management service by video and the availability of in person appointments have been discussed. It has also been discussed with the patient that there may be a patient responsible charge related to this service. The patient expressed understanding and agreed to proceed.   Reason for visit: scheduled follow up.    HPI: She reports she is doing relatively well.  Working from home. Overall handling stress relatively well.  Rosacea - worse with stress.  Tries to stay active.  Discussed diet and exercise.  No chest pain or sob reported.  No abdominal pain or bowel change reported.  Mammogram scheduled 02/11/20.  Received moderna booster 01/13/20.  Request antibody test after Thanksgiving.     ROS: See pertinent positives and negatives per HPI.  Past Medical History:  Diagnosis Date  . Anemia   . Asthma   . Basal cell carcinoma 04/09/2019   left lateral calf  . GERD (gastroesophageal reflux disease)   . History of chicken pox   . Hypercholesterolemia   . Hypertension   . Hypothyroidism   . Seasonal allergies   . Sleep apnea     Past Surgical History:  Procedure  Laterality Date  . arthroscopic surgery     Dr Sabra Heck  . BREAST CYST ASPIRATION Right 2005  . BREAST SURGERY  4 /05   biopsy  . CHOLECYSTECTOMY  2007  . COLONOSCOPY WITH PROPOFOL N/A 02/14/2016   Procedure: COLONOSCOPY WITH PROPOFOL;  Surgeon: Manya Silvas, MD;  Location: [facility];  Service: Endoscopy;  Laterality: N/A;  . DILATION AND CURETTAGE OF UTERUS  8/04  . LAPAROSCOPIC GASTRIC BANDING  01/29/09  . MYOMECTOMY  9/04  . VAGINAL HYSTERECTOMY  06/11/03    Family History  Problem Relation Age of Onset  . Lung cancer Mother   . Arthritis Mother   . Stroke Mother   . Hypertension Mother   . Colon polyps Mother   . Thyroid disease Sister        graves disease  . Colon cancer Other        maternal grandparent  . Hyperlipidemia Father   . Stroke Maternal Aunt   . Stroke Maternal Uncle   . Cancer Maternal Grandmother        colon  . Alcohol abuse Cousin   . Breast cancer Neg Hx   . Heart disease Neg Hx     SOCIAL HX: reviewed.    Current Outpatient Medications:  .  atorvastatin (LIPITOR) 10 MG tablet, TAKE 1 TABLET BY MOUTH  DAILY, Disp: 90 tablet, Rfl: 3 .  azelastine (ASTELIN) 0.1 % nasal spray, Place 1 spray into both nostrils 2 (two) times daily. Use in each nostril as directed, Disp: 30  mL, Rfl: 1 .  CALCIUM PO, Take 300 mg by mouth 4 (four) times daily. , Disp: , Rfl:  .  clobetasol ointment (TEMOVATE) 0.05 %, Apply twice daily to affected areas hands as needed for rash. Avoid face, groin, underarms., Disp: 80 g, Rfl: 0 .  Ivermectin (SOOLANTRA) 1 % CREA, Apply to face once daily for rosacea., Disp: 45 g, Rfl: 5 .  levothyroxine (SYNTHROID) 75 MCG tablet, TAKE 1 TABLET(75 MCG) BY MOUTH DAILY, Disp: 90 tablet, Rfl: 2 .  losartan (COZAAR) 100 MG tablet, TAKE 1 TABLET BY MOUTH  DAILY, Disp: 90 tablet, Rfl: 3 .  metroNIDAZOLE (METROCREAM) 0.75 % cream, APPLY A THIN COAT TO THE FACE TWICE DAILY, Disp: , Rfl:  .  Multiple Vitamin (MULTIVITAMIN PO), Take by mouth  daily.  , Disp: , Rfl:  .  pantoprazole (PROTONIX) 40 MG tablet, TAKE 1 TABLET BY MOUTH  DAILY, Disp: 90 tablet, Rfl: 3 .  triamcinolone cream (KENALOG) 0.1 %, APPLY A SMALL AMOUNT TO THE AFFECTED AREA TWICE DAILY AS NEEDED, Disp: , Rfl:  .  triamcinolone ointment (KENALOG) 0.1 %, Apply to affected areas chest and upper arms twice daily as needed for rash. Avoid face, groin, underarms., Disp: 454 g, Rfl: 0 .  venlafaxine XR (EFFEXOR-XR) 37.5 MG 24 hr capsule, TAKE 1 CAPSULE BY MOUTH  DAILY WITH BREAKFAST, Disp: 90 capsule, Rfl: 3  EXAM:  GENERAL: alert, oriented, appears well and in no acute distress  HEENT: atraumatic, conjunttiva clear, no obvious abnormalities on inspection of external nose and ears  NECK: normal movements of the head and neck  LUNGS: on inspection no signs of respiratory distress, breathing rate appears normal, no obvious gross SOB, gasping or wheezing  CV: no obvious cyanosis  PSYCH/NEURO: pleasant and cooperative, no obvious depression or anxiety, speech and thought processing grossly intact  ASSESSMENT AND PLAN:  Discussed the following assessment and plan:  Problem List Items Addressed This Visit    Hypothyroid    On thyroid replacement.  Follow tsh.       Hypertension    Continue losartan.  Blood pressure doing well.  Follow pressures.  Follow metabolic panel.       Hypercholesterolemia    On lipitor.  Low cholesterol diet and exercise.  Follow lipid panel and liver function tests.        GERD (gastroesophageal reflux disease)    Upper symptoms controlled. On protonix.        Other Visit Diagnoses    Unknown status of immunity to COVID-19 virus    -  Primary   Relevant Orders   SARS-CoV-2 Semi-Quantitative Total Antibody, Spike       I discussed the assessment and treatment plan with the patient. The patient was provided an opportunity to ask questions and all were answered. The patient agreed with the plan and demonstrated an understanding  of the instructions.   The patient was advised to call back or seek an in-person evaluation if the symptoms worsen or if the condition fails to improve as anticipated.    Einar Pheasant, MD

## 2020-02-01 ENCOUNTER — Encounter: Payer: Self-pay | Admitting: Internal Medicine

## 2020-02-01 NOTE — Addendum Note (Signed)
Addended by: Alisa Graff on: 02/01/2020 12:55 PM   Modules accepted: Orders

## 2020-02-01 NOTE — Assessment & Plan Note (Signed)
Continue losartan.  Blood pressure doing well.  Follow pressures.  Follow metabolic panel.  

## 2020-02-01 NOTE — Assessment & Plan Note (Signed)
On lipitor.  Low cholesterol diet and exercise.  Follow lipid panel and liver function tests.   

## 2020-02-01 NOTE — Assessment & Plan Note (Signed)
On thyroid replacement.  Follow tsh.  

## 2020-02-01 NOTE — Assessment & Plan Note (Signed)
Upper symptoms controlled.  On protonix.  

## 2020-02-11 ENCOUNTER — Other Ambulatory Visit: Payer: Self-pay

## 2020-02-11 ENCOUNTER — Ambulatory Visit
Admission: RE | Admit: 2020-02-11 | Discharge: 2020-02-11 | Disposition: A | Discharge status: Home / Self Care | Payer: Managed Care, Other (non HMO) | Source: Ambulatory Visit | Attending: Internal Medicine | Admitting: Internal Medicine

## 2020-02-11 DIAGNOSIS — Z1231 Encounter for screening mammogram for malignant neoplasm of breast: Secondary | ICD-10-CM | POA: Diagnosis present

## 2020-03-09 LAB — SARS-COV-2 SEMI-QUANTITATIVE TOTAL ANTIBODY, SPIKE
SARS-CoV-2 Semi-Quant Total Ab: >2500 U/mL (ref <0.8)
SARS-CoV-2 Spike Ab Interp: POSITIVE

## 2020-03-18 ENCOUNTER — Other Ambulatory Visit: Payer: Self-pay | Admitting: Internal Medicine

## 2020-03-19 ENCOUNTER — Other Ambulatory Visit: Payer: Self-pay | Admitting: Internal Medicine

## 2020-04-28 ENCOUNTER — Ambulatory Visit: Payer: Managed Care, Other (non HMO) | Admitting: Dermatology

## 2020-04-28 ENCOUNTER — Encounter: Payer: Self-pay | Admitting: Dermatology

## 2020-04-28 ENCOUNTER — Other Ambulatory Visit: Payer: Self-pay

## 2020-04-28 DIAGNOSIS — L719 Rosacea, unspecified: Secondary | ICD-10-CM

## 2020-04-28 DIAGNOSIS — L578 Other skin changes due to chronic exposure to nonionizing radiation: Secondary | ICD-10-CM

## 2020-04-28 DIAGNOSIS — L814 Other melanin hyperpigmentation: Secondary | ICD-10-CM

## 2020-04-28 DIAGNOSIS — Z1283 Encounter for screening for malignant neoplasm of skin: Secondary | ICD-10-CM

## 2020-04-28 DIAGNOSIS — D229 Melanocytic nevi, unspecified: Secondary | ICD-10-CM

## 2020-04-28 DIAGNOSIS — D1800 Hemangioma unspecified site: Secondary | ICD-10-CM

## 2020-04-28 DIAGNOSIS — L309 Dermatitis, unspecified: Secondary | ICD-10-CM

## 2020-04-28 DIAGNOSIS — L905 Scar conditions and fibrosis of skin: Secondary | ICD-10-CM

## 2020-04-28 DIAGNOSIS — D235 Other benign neoplasm of skin of trunk: Secondary | ICD-10-CM

## 2020-04-28 DIAGNOSIS — L821 Other seborrheic keratosis: Secondary | ICD-10-CM

## 2020-04-28 DIAGNOSIS — Z85828 Personal history of other malignant neoplasm of skin: Secondary | ICD-10-CM

## 2020-04-28 DIAGNOSIS — D2371 Other benign neoplasm of skin of right lower limb, including hip: Secondary | ICD-10-CM

## 2020-04-28 NOTE — Patient Instructions (Addendum)
Melanoma ABCDEs  Melanoma is the most dangerous type of skin cancer, and is the leading cause of death from skin disease.  You are more likely to develop melanoma if you:  Have light-colored skin, light-colored eyes, or red or blond hair  Spend a lot of time in the sun  Tan regularly, either outdoors or in a tanning bed  Have had blistering sunburns, especially during childhood  Have a close family member who has had a melanoma  Have atypical moles or large birthmarks  Early detection of melanoma is key since treatment is typically straightforward and cure rates are extremely high if we catch it early.   The first sign of melanoma is often a change in a mole or a new dark spot.  The ABCDE system is a way of remembering the signs of melanoma.  A for asymmetry:  The two halves do not match. B for border:  The edges of the growth are irregular. C for color:  A mixture of colors are present instead of an even brown color. D for diameter:  Melanomas are usually (but not always) greater than 30mm - the size of a pencil eraser. E for evolution:  The spot keeps changing in size, shape, and color.  Please check your skin once per month between visits. You can use a small mirror in front and a large mirror behind you to keep an eye on the back side or your body.   If you see any new or changing lesions before your next follow-up, please call to schedule a visit.  Please continue daily skin protection including broad spectrum sunscreen SPF 30+ to sun-exposed areas, reapplying every 2 hours as needed when you're outdoors.   Recommend taking Heliocare sun protection supplement daily in sunny weather for additional sun protection. For maximum protection on the sunniest days, you can take up to 2 capsules of regular Heliocare OR take 1 capsule of Heliocare Ultra. For prolonged exposure (such as a full day in the sun), you can repeat your dose of the supplement 4 hours after your first dose. Heliocare  can be purchased at [facility] or at [email or web address].   Recommend Nicotinamide 500mg  twice per day to lower risk of non-melanoma skin cancer by approximately 25%.     Topical steroids (such as triamcinolone, fluocinolone, fluocinonide, mometasone, clobetasol, halobetasol, betamethasone, hydrocortisone) can cause thinning and lightening of the skin if they are used for too long in the same area. Your physician has selected the right strength medicine for your problem and area affected on the body. Please use your medication only as directed by your physician to prevent side effects.

## 2020-04-28 NOTE — Progress Notes (Signed)
Follow-Up Visit   Subjective  Kristine Hendricks is a 55 y.o. female who presents for the following: 6 month tbse (Patient here today for 6 month total body. She has not new concerns today. ).  Patient here for full body skin exam and skin cancer screening. Scar right superior shoulder  The following portions of the chart were reviewed this encounter and updated as appropriate:  Tobacco  Allergies  Meds  Problems  Med Hx  Surg Hx  Fam Hx       Objective  Well appearing patient in no apparent distress; mood and affect are within normal limits.  A full examination was performed including scalp, head, eyes, ears, nose, lips, neck, chest, axillae, abdomen, back, buttocks, bilateral upper extremities, bilateral lower extremities, hands, feet, fingers, toes, fingernails, and toenails. All findings within normal limits unless otherwise noted below.  Objective  back, bilateral shoulders: Back and shoulder pink papules   Objective  face: Mid face erythema with telangiectasias and scattered inflammatory papules. .  Objective  biltaral eyelids: Thin erythematous plaque right upper eyelid   Objective  right superior shoulder: Scar with central hyperpigmentation  Assessment & Plan  Eczema, unspecified type back, bilateral shoulders  Chronic condition with duration over one year. Currently well-controlled.  Papular at chest and upper arms Dyshidrotic at hands   Continue  TMC 0.1% ointment twice daily to affected areas chest, upper arms as needed for rash for up to 2 weeks. Avoid face, groin, underarms.   Continue clobetasol 0.05% ointment to hands twice daily to affected areas hands as needed for rash for up to 2 weeks. Avoid face, groin, underarms.   Topical steroids (such as triamcinolone, fluocinolone, fluocinonide, mometasone, clobetasol, halobetasol, betamethasone, hydrocortisone) can cause thinning and lightening of the skin if they are used for too long in the same  area. Your physician has selected the right strength medicine for your problem and area affected on the body. Please use your medication only as directed by your physician to prevent side effects.            Rosacea face  Chronic condition with duration over one year. Currently well-controlled with only recent flare after facial.   Patient denies grittiness of eyes   Continue Soolantra once daily to face   If patient desires, she will call and we can send in [employer] metronidazole/ivermectin/azelaic acid as an alternative    Other Related Medications Ivermectin (SOOLANTRA) 1 % CREA  Dermatitis biltaral eyelids  Irritant eye dermatitis   Recommend  hydrocortisone 1 % cream twice a day as needed up to 2 weeks.     Scar right superior shoulder  Benign-appearing.  Observation.  Call clinic for new or changing moles.  Recommend daily use of broad spectrum spf 30+ sunscreen to sun-exposed areas.    Lentigines - Scattered tan macules - Discussed due to sun exposure - Benign, observe - Call for any changes  Seborrheic Keratoses - Stuck-on, waxy, tan-brown papules and plaques  - Discussed benign etiology and prognosis. - Observe - Call for any changes  Melanocytic Nevi - Tan-brown and/or pink-flesh-colored symmetric macules and papules - Benign appearing on exam today - Observation - Call clinic for new or changing moles - Recommend daily use of broad spectrum spf 30+ sunscreen to sun-exposed areas.   Hemangiomas - Red papules - Discussed benign nature - Observe - Call for any changes  Nevus Spilus back - Brown macules or papules within lighter tan patch - Genetic -  Benign, observe - Call for any changes  Dermatofibroma  Right posterior ankle  - Firm pink/brown papulenodule with dimple sign - Benign appearing - Call for any changes  Actinic Damage - Chronic, secondary to cumulative UV/sun exposure - diffuse scaly erythematous macules with  underlying dyspigmentation - Recommend daily broad spectrum sunscreen SPF 30+ to sun-exposed areas, reapply every 2 hours as needed.  - Call for new or changing lesions.  History of Basal Cell Carcinoma of the Skin Left lateral calf  - No evidence of recurrence today - Recommend regular full body skin exams - Recommend daily broad spectrum sunscreen SPF 30+ to sun-exposed areas, reapply every 2 hours as needed.  - Call if any new or changing lesions are noted between office visits   Skin cancer screening performed today.  Return in about 6 months (around 10/26/2020) for tbse .  I, Ruthell Rummage, CMA, am acting as scribe for Forest Gleason, MD.  Documentation: I have reviewed the above documentation for accuracy and completeness, and I agree with the above.  Forest Gleason, MD

## 2020-06-03 ENCOUNTER — Ambulatory Visit: Payer: Managed Care, Other (non HMO) | Admitting: Internal Medicine

## 2020-06-09 ENCOUNTER — Encounter: Payer: Self-pay | Admitting: Internal Medicine

## 2020-06-09 NOTE — Telephone Encounter (Signed)
Can schedule both of them for appts tomorrow - add on virtual appts and can discuss treatment and if outpt ambulatory referral needed for other treatment.

## 2020-06-10 ENCOUNTER — Telehealth: Payer: Self-pay | Admitting: Internal Medicine

## 2020-06-10 ENCOUNTER — Telehealth (INDEPENDENT_AMBULATORY_CARE_PROVIDER_SITE_OTHER): Payer: Managed Care, Other (non HMO) | Admitting: Internal Medicine

## 2020-06-10 DIAGNOSIS — I10 Essential (primary) hypertension: Secondary | ICD-10-CM | POA: Diagnosis not present

## 2020-06-10 DIAGNOSIS — U071 COVID-19: Secondary | ICD-10-CM

## 2020-06-10 NOTE — Telephone Encounter (Signed)
My chart message sent to pt for update.   

## 2020-06-10 NOTE — Progress Notes (Signed)
Patient ID: Kristine Hendricks, female   DOB: [birth year], 55 y.o.   MRN: [id number]   Virtual Visit via video Note  This visit type was conducted due to national recommendations for restrictions regarding the COVID-19 pandemic (e.g. social distancing).  This format is felt to be most appropriate for this patient at this time.  All issues noted in this document were discussed and addressed.  No physical exam was performed (except for noted visual exam findings with Video Visits).   I connected with Kristine Hendricks by a video enabled telemedicine application and verified that I am speaking with the correct person using two identifiers. Location patient: home Location provider: work Persons participating in the virtual visit: patient, provider  The limitations, risks, security and privacy concerns of performing an evaluation and management service by video and the availability of in person appointments have been discussed.  It has also been discussed with the patient that there may be a patient responsible charge related to this service. The patient expressed understanding and agreed to proceed.   Reason for visit: work in appt  HPI: Work in - covid positive.  Symptoms started 06/02/20.  Noticed scratchy throat and decreased appetite.  Increased sinus drainage.  Next day - sore throat.  Minimal headache.  Increased sneezing.  Increased fatigue.  Rapid test positive Sunday - 06/06/20.  Had PCR to follow - positive.  Not sneezing now.  Some nasal congestion. Minimal cough.  Increased drainage.  No loss of taste. No loss of smell.  Still with decreased energy. Pulse ox 97-98%.  Feeling some better today.  Starting to get a little hungry today.     ROS: See pertinent positives and negatives per HPI.  Past Medical History:  Diagnosis Date  . Anemia   . Asthma   . Basal cell carcinoma 04/09/2019   left lateral calf  . GERD (gastroesophageal reflux disease)   . History of chicken pox   .  Hypercholesterolemia   . Hypertension   . Hypothyroidism   . Seasonal allergies   . Sleep apnea     Past Surgical History:  Procedure Laterality Date  . arthroscopic surgery     Dr Sabra Heck  . BREAST CYST ASPIRATION Right 2005  . BREAST SURGERY  4 /05   biopsy  . CHOLECYSTECTOMY  2007  . COLONOSCOPY WITH PROPOFOL N/A 02/14/2016   Procedure: COLONOSCOPY WITH PROPOFOL;  Surgeon: Manya Silvas, MD;  Location: [facility] ENDOSCOPY;  Service: Endoscopy;  Laterality: N/A;  . DILATION AND CURETTAGE OF UTERUS  8/04  . LAPAROSCOPIC GASTRIC BANDING  01/29/09  . MYOMECTOMY  9/04  . VAGINAL HYSTERECTOMY  06/11/03    Family History  Problem Relation Age of Onset  . Lung cancer Mother   . Arthritis Mother   . Stroke Mother   . Hypertension Mother   . Colon polyps Mother   . Thyroid disease Sister        graves disease  . Colon cancer Other        maternal grandparent  . Hyperlipidemia Father   . Stroke Maternal Aunt   . Stroke Maternal Uncle   . Cancer Maternal Grandmother        colon  . Alcohol abuse Cousin   . Breast cancer Neg Hx   . Heart disease Neg Hx     SOCIAL HX: reviewed.    Current Outpatient Medications:  .  atorvastatin (LIPITOR) 10 MG tablet, TAKE 1 TABLET BY MOUTH  DAILY, Disp: 90 tablet,  Rfl: 3 .  azelastine (ASTELIN) 0.1 % nasal spray, Place 1 spray into both nostrils 2 (two) times daily. Use in each nostril as directed, Disp: 30 mL, Rfl: 1 .  CALCIUM PO, Take 300 mg by mouth 4 (four) times daily., Disp: , Rfl:  .  levothyroxine (SYNTHROID) 75 MCG tablet, TAKE 1 TABLET(75 MCG) BY MOUTH DAILY, Disp: 90 tablet, Rfl: 2 .  losartan (COZAAR) 100 MG tablet, TAKE 1 TABLET BY MOUTH  DAILY, Disp: 90 tablet, Rfl: 3 .  meloxicam (MOBIC) 15 MG tablet, Take 15 mg by mouth daily., Disp: , Rfl:  .  Multiple Vitamin (MULTIVITAMIN PO), Take by mouth daily., Disp: , Rfl:  .  pantoprazole (PROTONIX) 40 MG tablet, TAKE 1 TABLET BY MOUTH  DAILY, Disp: 90 tablet, Rfl: 3 .  venlafaxine  XR (EFFEXOR-XR) 37.5 MG 24 hr capsule, TAKE 1 CAPSULE BY MOUTH  DAILY WITH BREAKFAST, Disp: 90 capsule, Rfl: 3  EXAM:  VITALS per patient if applicable: pulse ox 38-17%  GENERAL: alert, oriented, appears well and in no acute distress  HEENT: atraumatic, conjunttiva clear, no obvious abnormalities on inspection of external nose and ears  NECK: normal movements of the head and neck  LUNGS: on inspection no signs of respiratory distress, breathing rate appears normal, no obvious gross SOB, gasping or wheezing  CV: no obvious cyanosis  PSYCH/NEURO: pleasant and cooperative, no obvious depression or anxiety, speech and thought processing grossly intact  ASSESSMENT AND PLAN:  Discussed the following assessment and plan:  Problem List Items Addressed This Visit    COVID-19 virus infection    covid infection - positive 06/06/20.  Discussed quarantine guidelines.  nasacort nasal spray and saline nasal spray as directed.  Robitussin/mucinex as directed.  Rest.  Fluids.  Discussed vitamins.  Follow.  Call with update.        Hypertension    Has been under control.  On no medication.  Follow.           I discussed the assessment and treatment plan with the patient. The patient was provided an opportunity to ask questions and all were answered. The patient agreed with the plan and demonstrated an understanding of the instructions.   The patient was advised to call back or seek an in-person evaluation if the symptoms worsen or if the condition fails to improve as anticipated.    Einar Pheasant, MD

## 2020-06-13 ENCOUNTER — Encounter: Payer: Self-pay | Admitting: Internal Medicine

## 2020-06-13 DIAGNOSIS — U071 COVID-19: Secondary | ICD-10-CM | POA: Insufficient documentation

## 2020-06-13 NOTE — Assessment & Plan Note (Signed)
Has been under control.  On no medication.  Follow.

## 2020-06-13 NOTE — Assessment & Plan Note (Signed)
covid infection - positive 06/06/20.  Discussed quarantine guidelines.  nasacort nasal spray and saline nasal spray as directed.  Robitussin/mucinex as directed.  Rest.  Fluids.  Discussed vitamins.  Follow.  Call with update.

## 2020-07-20 ENCOUNTER — Ambulatory Visit: Payer: Managed Care, Other (non HMO) | Admitting: Internal Medicine

## 2020-09-13 ENCOUNTER — Ambulatory Visit: Payer: Managed Care, Other (non HMO) | Admitting: Internal Medicine

## 2020-09-15 ENCOUNTER — Other Ambulatory Visit: Payer: Self-pay | Admitting: Internal Medicine

## 2020-10-26 ENCOUNTER — Other Ambulatory Visit: Payer: Self-pay | Admitting: Internal Medicine

## 2020-11-10 ENCOUNTER — Other Ambulatory Visit: Payer: Self-pay

## 2020-11-10 ENCOUNTER — Encounter: Payer: Self-pay | Admitting: Dermatology

## 2020-11-10 ENCOUNTER — Ambulatory Visit: Payer: Managed Care, Other (non HMO) | Admitting: Dermatology

## 2020-11-10 DIAGNOSIS — Z1283 Encounter for screening for malignant neoplasm of skin: Secondary | ICD-10-CM | POA: Diagnosis not present

## 2020-11-10 DIAGNOSIS — D2371 Other benign neoplasm of skin of right lower limb, including hip: Secondary | ICD-10-CM

## 2020-11-10 DIAGNOSIS — L309 Dermatitis, unspecified: Secondary | ICD-10-CM | POA: Diagnosis not present

## 2020-11-10 DIAGNOSIS — L280 Lichen simplex chronicus: Secondary | ICD-10-CM

## 2020-11-10 DIAGNOSIS — D2261 Melanocytic nevi of right upper limb, including shoulder: Secondary | ICD-10-CM

## 2020-11-10 DIAGNOSIS — D1800 Hemangioma unspecified site: Secondary | ICD-10-CM

## 2020-11-10 DIAGNOSIS — L578 Other skin changes due to chronic exposure to nonionizing radiation: Secondary | ICD-10-CM

## 2020-11-10 DIAGNOSIS — Z85828 Personal history of other malignant neoplasm of skin: Secondary | ICD-10-CM | POA: Diagnosis not present

## 2020-11-10 DIAGNOSIS — L719 Rosacea, unspecified: Secondary | ICD-10-CM | POA: Diagnosis not present

## 2020-11-10 DIAGNOSIS — L853 Xerosis cutis: Secondary | ICD-10-CM

## 2020-11-10 DIAGNOSIS — L814 Other melanin hyperpigmentation: Secondary | ICD-10-CM

## 2020-11-10 DIAGNOSIS — D229 Melanocytic nevi, unspecified: Secondary | ICD-10-CM

## 2020-11-10 DIAGNOSIS — L821 Other seborrheic keratosis: Secondary | ICD-10-CM

## 2020-11-10 MED ORDER — DOXYCYCLINE HYCLATE 20 MG PO TABS: 20.0000 mg | ORAL_TABLET | Freq: Two times a day (BID) | ORAL | 6 refills | Status: DC

## 2020-11-10 NOTE — Progress Notes (Signed)
Follow-Up Visit   Subjective  Kristine Hendricks is a 55 y.o. female who presents for the following: Annual Exam (Patient is here today for 6 month tbse. She reports at spot on right index finger she would like checked today.).  Patient here for full body skin exam and skin cancer screening.   The following portions of the chart were reviewed this encounter and updated as appropriate:  Tobacco  Allergies  Meds  Problems  Med Hx  Surg Hx  Fam Hx       Objective  Well appearing patient in no apparent distress; mood and affect are within normal limits.  A full examination was performed including scalp, head, eyes, ears, nose, lips, neck, chest, axillae, abdomen, back, buttocks, bilateral upper extremities, bilateral lower extremities, hands, feet, fingers, toes, fingernails, and toenails. All findings within normal limits unless otherwise noted below.  face Mid face erythema with many papules and pustules   Mid Back Colescing pink papules at back   bilateral feet Cracked heels with xerosis   Right 2nd Finger 0.3 cm light brown symmetrical macule   Assessment & Plan  Rosacea face  Rosacea is a chronic progressive skin condition usually affecting the face of adults, causing redness and/or acne bumps. It is treatable but not curable. It sometimes affects the eyes (ocular rosacea) as well. It may respond to topical and/or systemic medication and can flare with stress, sun exposure, alcohol, exercise and some foods.  Daily application of broad spectrum spf 30+ sunscreen to face is recommended to reduce flares.  Chronic condition with duration or expected duration over one year. Condition is bothersome to patient. Not currently at goal.  Start doxycycline 20 mg by mouth twice daily with food. 60 tab 6 rf to see if this helps with ocular symptoms  Doxycycline should be taken with food to prevent nausea. Do not lay down for 30 minutes after taking. Be cautious with sun exposure  and use good sun protection while on this medication. Pregnant women should not take this medication.   Discussed skin medicinals metronidazole/ivermectin/azelaic acid with patient, patient deferred at this time.    doxycycline (PERIOSTAT) 20 MG tablet - face Take 1 tablet (20 mg total) by mouth 2 (two) times daily. Take with food  Eczema, unspecified type Mid Back  Chronic condition with duration or expected duration over one year. Condition is bothersome to patient. Currently flared.  Currently flared at back  With lichenification   Responds will to topically steriods  D/c clobetasol   Continue tmc 0.1 cream as needed  Will consider tacrolimus rx if patient is still having flares.  Topical steroids (such as triamcinolone, fluocinolone, fluocinonide, mometasone, clobetasol, halobetasol, betamethasone, hydrocortisone) can cause thinning and lightening of the skin if they are used for too long in the same area. Your physician has selected the right strength medicine for your problem and area affected on the body. Please use your medication only as directed by your physician to prevent side effects.     Xerosis cutis bilateral feet  Recommend otc urea 20 % cream to affected areas 1 - 2 times daily until healed.   Recommend baby feet foot treatment  Nevus Right 2nd Finger  Benign-appearing.  Observation.  Call clinic for new or changing lesions.  Recommend daily use of broad spectrum spf 30+ sunscreen to sun-exposed areas.   Lentigines - Scattered tan macules - Due to sun exposure - Benign-appering, observe - Recommend daily broad spectrum sunscreen SPF 30+ to  sun-exposed areas, reapply every 2 hours as needed. - Call for any changes  Seborrheic Keratoses - Stuck-on, waxy, tan-brown papules and/or plaques  - Benign-appearing - Discussed benign etiology and prognosis. - Observe - Call for any changes  Melanocytic Nevi - Tan-brown and/or pink-flesh-colored  symmetric macules and papules - Benign appearing on exam today - Observation - Call clinic for new or changing moles - Recommend daily use of broad spectrum spf 30+ sunscreen to sun-exposed areas.   Hemangiomas - Red papules - Discussed benign nature - Observe - Call for any changes  Dermatofibroma - Firm pink/brown papulenodule with dimple sign right posterior ankle  - Benign appearing - Call for any changes  Actinic Damage - Chronic condition, secondary to cumulative UV/sun exposure - diffuse scaly erythematous macules with underlying dyspigmentation - Recommend daily broad spectrum sunscreen SPF 30+ to sun-exposed areas, reapply every 2 hours as needed.  - Staying in the shade or wearing long sleeves, sun glasses (UVA+UVB protection) and wide brim hats (4-inch brim around the entire circumference of the hat) are also recommended for sun protection.  - Call for new or changing lesions.  History of Basal Cell Carcinoma of the Skin - No evidence of recurrence today left lateral calf (2021) - Recommend regular full body skin exams - Recommend daily broad spectrum sunscreen SPF 30+ to sun-exposed areas, reapply every 2 hours as needed.  - Call if any new or changing lesions are noted between office visits   Skin cancer screening performed today.  Return in about 6 months (around 05/13/2021) for tbse. I, Ruthell Rummage, CMA, am acting as scribe for Forest Gleason, MD.  Documentation: I have reviewed the above documentation for accuracy and completeness, and I agree with the above.  Forest Gleason, MD

## 2020-11-10 NOTE — Patient Instructions (Signed)
Doxycycline should be taken with food to prevent nausea. Do not lay down for 30 minutes after taking. Be cautious with sun exposure and use good sun protection while on this medication. Pregnant women should not take this medication.   Melanoma ABCDEs  Melanoma is the most dangerous type of skin cancer, and is the leading cause of death from skin disease.  You are more likely to develop melanoma if you: Have light-colored skin, light-colored eyes, or red or blond hair Spend a lot of time in the sun Tan regularly, either outdoors or in a tanning bed Have had blistering sunburns, especially during childhood Have a close family member who has had a melanoma Have atypical moles or large birthmarks  Early detection of melanoma is key since treatment is typically straightforward and cure rates are extremely high if we catch it early.   The first sign of melanoma is often a change in a mole or a new dark spot.  The ABCDE system is a way of remembering the signs of melanoma.  A for asymmetry:  The two halves do not match. B for border:  The edges of the growth are irregular. C for color:  A mixture of colors are present instead of an even brown color. D for diameter:  Melanomas are usually (but not always) greater than 45m - the size of a pencil eraser. E for evolution:  The spot keeps changing in size, shape, and color.  Please check your skin once per month between visits. You can use a small mirror in front and a large mirror behind you to keep an eye on the back side or your body.   If you see any new or changing lesions before your next follow-up, please call to schedule a visit.  Please continue daily skin protection including broad spectrum sunscreen SPF 30+ to sun-exposed areas, reapplying every 2 hours as needed when you're outdoors.   Staying in the shade or wearing long sleeves, sun glasses (UVA+UVB protection) and wide brim hats (4-inch brim around the entire circumference of the hat)  are also recommended for sun protection.    Recommend taking Heliocare sun protection supplement daily in sunny weather for additional sun protection. For maximum protection on the sunniest days, you can take up to 2 capsules of regular Heliocare OR take 1 capsule of Heliocare Ultra. For prolonged exposure (such as a full day in the sun), you can repeat your dose of the supplement 4 hours after your first dose. Heliocare [facility] at [email or web address]       If you have any questions or concerns for your doctor, please call our main line at [phone] press option 4 to reach your doctor's medical assistant. If no one answers, please leave a voicemail as directed and we will return your call as soon as possible. Messages left after 4 pm will be answered the following business day.   You may also send [place] message via [facility] We typically respond to MyChart messages within 1-2 business days.  For prescription refills, please ask your pharmacy to contact our office. Our fax number is [phone]  If you have an urgent issue when the clinic is closed that cannot wait until the next business day, you can page your doctor at the number below.    Please note that while we do our best to be available for urgent issues outside of office hours, we are not available 24/7.   If  you have an urgent issue and are unable to reach [place], you may choose to seek medical care at your doctor's office, retail clinic, urgent care center, or emergency room.  If you have a medical emergency, please immediately call 911 or go to the emergency department.  Pager Numbers  - Dr. Nehemiah Massed: [phone]  - Dr. Laurence Ferrari: [phone]  - Dr. Nicole Kindred: [phone]  In the event of inclement weather, please call our main line at [phone] for an update on the status of any delays or closures.  Dermatology Medication Tips: Please keep the boxes that topical medications come in in  order to help keep track of the instructions about where and how to use these. Pharmacies typically print the medication instructions only on the boxes and not directly on the medication tubes.   If your medication is too expensive, please contact our office at [phone] option 4 or send [place] a message through [facility].   We are unable to tell what your co-pay for medications will be in advance as this is different depending on your insurance coverage. However, we may be able to find a substitute medication at lower cost or fill out paperwork to get insurance to cover a needed medication.   If a prior authorization is required to get your medication covered by your insurance company, please allow [place] 1-2 business days to complete this process.  Drug prices often vary depending on where the prescription is filled and some pharmacies may offer cheaper prices.  The website www.goodrx.com contains coupons for medications through different pharmacies. The prices here do not account for what the cost may be with help from insurance (it may be cheaper with your insurance), but the website can give you the price if you did not use any insurance.  - You can print the associated coupon and take it with your prescription to the pharmacy.  - You may also stop by our office during regular business hours and pick up a GoodRx coupon card.  - If you need your prescription sent electronically to a different pharmacy, notify our office through [facility] or by phone at [phone] option 4.

## 2020-12-06 ENCOUNTER — Ambulatory Visit: Payer: Managed Care, Other (non HMO) | Admitting: Internal Medicine

## 2020-12-07 ENCOUNTER — Encounter: Payer: Self-pay | Admitting: Internal Medicine

## 2020-12-08 NOTE — Telephone Encounter (Signed)
Please call and confirm she is doing ok.  It appears she is wanting to hold on appt, further treatment, etc.  Please notify her of quarantine guidelines.  Let us know if needs anything.

## 2020-12-09 ENCOUNTER — Other Ambulatory Visit: Payer: Self-pay | Admitting: Internal Medicine

## 2020-12-10 NOTE — Telephone Encounter (Signed)
Left detailed message for pt 

## 2021-02-15 ENCOUNTER — Other Ambulatory Visit: Payer: Self-pay | Admitting: Internal Medicine

## 2021-02-15 DIAGNOSIS — Z1231 Encounter for screening mammogram for malignant neoplasm of breast: Secondary | ICD-10-CM

## 2021-02-22 ENCOUNTER — Ambulatory Visit
Admission: RE | Admit: 2021-02-22 | Discharge: 2021-02-22 | Disposition: A | Discharge status: Home / Self Care | Payer: Managed Care, Other (non HMO) | Source: Ambulatory Visit | Attending: Internal Medicine | Admitting: Internal Medicine

## 2021-02-22 ENCOUNTER — Other Ambulatory Visit: Payer: Self-pay

## 2021-02-22 DIAGNOSIS — Z1231 Encounter for screening mammogram for malignant neoplasm of breast: Secondary | ICD-10-CM | POA: Diagnosis present

## 2021-02-23 ENCOUNTER — Other Ambulatory Visit: Payer: Self-pay | Admitting: Internal Medicine

## 2021-02-23 DIAGNOSIS — R928 Other abnormal and inconclusive findings on diagnostic imaging of breast: Secondary | ICD-10-CM

## 2021-02-23 NOTE — Progress Notes (Signed)
Order placed for f/u left breast mammogram and ultrasound.   

## 2021-03-04 ENCOUNTER — Other Ambulatory Visit: Payer: Self-pay | Admitting: Internal Medicine

## 2021-03-09 ENCOUNTER — Ambulatory Visit
Admission: RE | Admit: 2021-03-09 | Discharge: 2021-03-09 | Disposition: A | Discharge status: Home / Self Care | Payer: Managed Care, Other (non HMO) | Source: Ambulatory Visit | Attending: Internal Medicine | Admitting: Internal Medicine

## 2021-03-09 ENCOUNTER — Other Ambulatory Visit: Payer: Self-pay

## 2021-03-09 DIAGNOSIS — R928 Other abnormal and inconclusive findings on diagnostic imaging of breast: Secondary | ICD-10-CM

## 2021-03-11 ENCOUNTER — Other Ambulatory Visit: Payer: Self-pay | Admitting: Internal Medicine

## 2021-03-11 DIAGNOSIS — R928 Other abnormal and inconclusive findings on diagnostic imaging of breast: Secondary | ICD-10-CM

## 2021-03-11 NOTE — Progress Notes (Signed)
Order placed for surgery referral for abnormal mammogram.

## 2021-05-08 ENCOUNTER — Encounter: Payer: Self-pay | Admitting: Internal Medicine

## 2021-05-08 DIAGNOSIS — R928 Other abnormal and inconclusive findings on diagnostic imaging of breast: Secondary | ICD-10-CM | POA: Insufficient documentation

## 2021-05-12 ENCOUNTER — Ambulatory Visit: Payer: Managed Care, Other (non HMO) | Admitting: Dermatology

## 2021-06-23 ENCOUNTER — Ambulatory Visit
Admission: RE | Admit: 2021-06-23 | Discharge: 2021-06-23 | Disposition: A | Discharge status: Home / Self Care | Payer: Managed Care, Other (non HMO) | Source: Ambulatory Visit | Attending: Internal Medicine | Admitting: Internal Medicine

## 2021-06-23 ENCOUNTER — Ambulatory Visit: Payer: Self-pay

## 2021-06-23 VITALS — BP 134/94 | HR 70 | Temp 99.0°F | Resp 18

## 2021-06-23 DIAGNOSIS — J040 Acute laryngitis: Secondary | ICD-10-CM | POA: Diagnosis not present

## 2021-06-23 DIAGNOSIS — H109 Unspecified conjunctivitis: Secondary | ICD-10-CM | POA: Diagnosis not present

## 2021-06-23 DIAGNOSIS — J029 Acute pharyngitis, unspecified: Secondary | ICD-10-CM

## 2021-06-23 DIAGNOSIS — H6983 Other specified disorders of Eustachian tube, bilateral: Secondary | ICD-10-CM | POA: Diagnosis not present

## 2021-06-23 DIAGNOSIS — B9689 Other specified bacterial agents as the cause of diseases classified elsewhere: Secondary | ICD-10-CM

## 2021-06-23 LAB — POCT RAPID STREP A (OFFICE): Rapid Strep A Screen: NEGATIVE

## 2021-06-23 MED ORDER — FEXOFENADINE HCL 180 MG PO TABS: 180.0000 mg | ORAL_TABLET | Freq: Every day | ORAL | 0 refills | Status: DC

## 2021-06-23 MED ORDER — PREDNISONE 20 MG PO TABS: 20.0000 mg | ORAL_TABLET | Freq: Every day | ORAL | 0 refills | Status: DC

## 2021-06-23 MED ORDER — BENZONATATE 200 MG PO CAPS: 200.0000 mg | ORAL_CAPSULE | Freq: Three times a day (TID) | ORAL | 0 refills | Status: DC | PRN

## 2021-06-23 MED ORDER — CIPROFLOXACIN HCL 0.3 % OP OINT: TOPICAL_OINTMENT | OPHTHALMIC | 0 refills | Status: DC

## 2021-06-23 NOTE — ED Triage Notes (Signed)
Patient presents to Urgent Care with complaints of sore throat since last weds, bilateral eye drainage since Tuesday. Treating symptoms with mucinex.  ?

## 2021-06-23 NOTE — ED Provider Notes (Signed)
Renaldo Fiddler    CSN: [id number] Arrival date & time: 06/23/21  1018      History   Chief Complaint Chief Complaint  Patient presents with   Sore Throat    Also eye drainage. - Entered by patient    HPI Kristine Hendricks is a 56 y.o. female who presents with onset of ST x 8 days, denies fever. Has been having worse in the pm. Lost her voice. The ST got so bad yesterday, that she could hardy swallow last night. Does not have much of a cough, only from a tickle in the back of her throat.     Past Medical History:  Diagnosis Date   Anemia    Asthma    Basal cell carcinoma 04/09/2019   left lateral calf   GERD (gastroesophageal reflux disease)    History of chicken pox    Hypercholesterolemia    Hypertension    Hypothyroidism    Seasonal allergies    Sleep apnea     Patient Active Problem List   Diagnosis Date Noted   Abnormal mammogram 05/08/2021   COVID-19 virus infection 06/13/2020   Hypothyroid 07/12/2018   Palpitations 12/13/2017   PAC (premature atrial contraction) 12/13/2017   Rapid heart beat 11/25/2017   Back pain 05/24/2017   Ear fullness 12/09/2016   Elevated TSH 12/09/2016   History of colonic polyps 02/17/2016   Rash 07/03/2015   Health care maintenance 04/01/2015   Subacute cutaneous lupus erythematosus 05/25/2014   Headache 11/06/2012   Menopausal syndrome 06/09/2012   Symptomatic menopausal or female climacteric states 06/09/2012   BMI 50.0-59.9, adult (HCC) 02/19/2012   Morbid obesity (HCC) 02/19/2012   Sleep apnea 02/17/2012   GERD (gastroesophageal reflux disease) 02/17/2012   Hypertension 02/12/2012   Hypercholesterolemia 02/12/2012    Past Surgical History:  Procedure Laterality Date   arthroscopic surgery     Dr Hyacinth Meeker   BREAST CYST ASPIRATION Right 2005   BREAST SURGERY  4 /05   biopsy   CHOLECYSTECTOMY  2007   COLONOSCOPY WITH PROPOFOL N/A 02/14/2016   Procedure: COLONOSCOPY WITH PROPOFOL;  Surgeon: Scot Jun,  MD;  Location: [facility] ENDOSCOPY;  Service: Endoscopy;  Laterality: N/A;   DILATION AND CURETTAGE OF UTERUS  8/04   LAPAROSCOPIC GASTRIC BANDING  01/29/09   MYOMECTOMY  9/04   VAGINAL HYSTERECTOMY  06/11/03    OB History   No obstetric history on file.      Home Medications    Prior to Admission medications   Medication Sig Start Date End Date Taking? Authorizing Provider  benzonatate (TESSALON) 200 MG capsule Take 1 capsule (200 mg total) by mouth 3 (three) times daily as needed for cough. 06/23/21  Yes Rodriguez-Southworth, Nettie Elm, PA-C  ciprofloxacin (CILOXAN) 0.3 % ophthalmic ointment One gtt in each eye q 2 h while awake for 2 days,then q 4h x 5 days 06/23/21  Yes Rodriguez-Southworth, Nettie Elm, PA-C  fexofenadine (ALLEGRA ALLERGY) 180 MG tablet Take 1 tablet (180 mg total) by mouth daily. 06/23/21  Yes Rodriguez-Southworth, Nettie Elm, PA-C  predniSONE (DELTASONE) 20 MG tablet Take 1 tablet (20 mg total) by mouth daily with breakfast. 06/23/21  Yes Rodriguez-Southworth, Nettie Elm, PA-C  atorvastatin (LIPITOR) 10 MG tablet TAKE 1 TABLET BY MOUTH  DAILY 12/10/20   Dale Atkins, MD  azelastine (ASTELIN) 0.1 % nasal spray Place 1 spray into both nostrils 2 (two) times daily. Use in each nostril as directed 08/15/19   Dale Eureka, MD  CALCIUM PO Take 300  mg by mouth 4 (four) times daily.    [provider]  doxycycline (PERIOSTAT) 20 MG tablet Take 1 tablet (20 mg total) by mouth 2 (two) times daily. Take with food 11/10/20   Moye, [place], MD  levothyroxine (SYNTHROID) 75 MCG tablet TAKE 1 TABLET(75 MCG) BY MOUTH DAILY 10/26/20   Dale [place] Bishop, MD  losartan (COZAAR) 100 MG tablet TAKE 1 TABLET BY MOUTH  DAILY 09/16/20   Dale Mathews, MD  meloxicam (MOBIC) 15 MG tablet Take 15 mg by mouth daily. 04/21/20   [provider]  Multiple Vitamin (MULTIVITAMIN PO) Take by mouth daily.    [provider]  pantoprazole (PROTONIX) 40 MG tablet TAKE 1 TABLET BY MOUTH  DAILY 12/10/20    Dale Waverly, MD  venlafaxine XR (EFFEXOR-XR) 37.5 MG 24 hr capsule TAKE 1 CAPSULE BY MOUTH  DAILY WITH BREAKFAST 03/07/21   Dale , MD    Family History Family History  Problem Relation Age of Onset   Lung cancer Mother    Arthritis Mother    Stroke Mother    Hypertension Mother    Colon polyps Mother    Thyroid disease Sister        graves disease   Colon cancer Other        maternal grandparent   Hyperlipidemia Father    Stroke Maternal Aunt    Stroke Maternal Uncle    Cancer Maternal Grandmother        colon   Alcohol abuse Cousin    Breast cancer Neg Hx    Heart disease Neg Hx     Social History Social History   Tobacco Use   Smoking status: Never   Smokeless tobacco: Never  Substance Use Topics   Alcohol use: No    Alcohol/week: 0.0 standard drinks    Comment: 1 drink/month   Drug use: No     Allergies   Sulfa antibiotics, Sulfasalazine, and Codeine   Review of Systems Review of Systems  Constitutional:  Negative for appetite change, chills and fever.  HENT:  Positive for ear pain, postnasal drip, sore throat and voice change. Negative for ear discharge and trouble swallowing.   Eyes:  Positive for discharge, redness and itching. Negative for photophobia and pain.  Respiratory:  Negative for cough.   Musculoskeletal:  Negative for myalgias.  Skin:  Negative for rash.  Neurological:  Negative for headaches.    Physical Exam Triage Vital Signs ED Triage Vitals  Enc Vitals Group     BP 06/23/21 1030 (!) 134/94     Pulse Rate 06/23/21 1030 70     Resp 06/23/21 1030 18     Temp 06/23/21 1030 99 F (37.2 C)     Temp Source 06/23/21 1030 Oral     SpO2 06/23/21 1030 95 %     Weight --      Height --      Head Circumference --      Peak Flow --      Pain Score 06/23/21 1038 0     Pain Loc --      Pain Edu? --      Excl. in GC? --    No data found.  Updated Vital Signs BP (!) 134/94 (BP Location: Left Arm)   Pulse 70   Temp 99 F  (37.2 C) (Oral)   Resp 18   LMP 06/11/2003   SpO2 95%   Visual Acuity Right Eye Distance:   Left Eye Distance:  Bilateral Distance:    Right Eye Near:   Left Eye Near:    Bilateral Near:      Physical Exam Vitals signs and nursing note reviewed.  Constitutional:      General: She is not in acute distress. Her voice is a little horsed    Appearance: Normal appearance. She is not ill-appearing, toxic-appearing or diaphoretic.  HENT:     Head: Normocephalic.     Right Ear: Tympanic membrane, ear canal and external ear normal.     Left Ear: Tympanic membrane, ear canal and external ear normal.     Nose: Nose normal.     Mouth/Throat:     Mouth: Mucous membranes are moist.  Eyes:     General: No scleral icterus.       Right eye: with slightly injected sclera and yellow matter draining        Left eye: with slightly injected sclera, and yellow matter in the tear duct area.     Conjunctiva/sclera: Conjunctivae normal.  Neck:     Musculoskeletal: Neck supple. No neck rigidity.  Cardiovascular:     Rate and Rhythm: Normal rate and regular rhythm.     Heart sounds: No murmur.  Pulmonary:     Effort: Pulmonary effort is normal. Lungs are clear    Musculoskeletal: Normal range of motion.  Lymphadenopathy:     Cervical: No cervical adenopathy.  Skin:    General: Skin is warm and dry.     Coloration: Skin is not jaundiced.     Findings: No rash.  Neurological:     Mental Status: She is alert and oriented to person, place, and time.     Gait: Gait normal.  Psychiatric:        Mood and Affect: Mood normal.        Behavior: Behavior normal.        Thought Content: Thought content normal.        Judgment: Judgment normal.    UC Treatments / Results  Labs (all labs ordered are listed, but only abnormal results are displayed) Labs Reviewed  POCT RAPID STREP A (OFFICE)  PCR strep test is negative  EKG   Radiology No results found.  Procedures Procedures (including  critical care time)  Medications Ordered in UC Medications - No data to display  Initial Impression / Assessment and Plan / UC Course  I have reviewed the triage vital signs and the nursing notes.  Pertinent labs  results that were available during my care of the patient were reviewed by me and considered in my medical decision making (see chart for details).    Pharyngitis, Laryngitis and Bilateral bacterial conjunctivitis.   I placed her on Cipro eye gtts as noted, Allegra and Tessalon perless as noted.  Final Clinical Impressions(s) / UC Diagnoses   Final diagnoses:  Laryngitis  Bacterial conjunctivitis of both eyes  Eustachian tube dysfunction, bilateral  Acute pharyngitis, unspecified etiology     Discharge Instructions      If you can you may try doing Saline rinses of your nose twice a day for 5-7 days to clear out drainage. I will have you try a few days of prednisone to help with the horseness  The Benzoate perless is for the tickle in your throat and cough, and the Allegra for drainage and allergies.      ED Prescriptions     Medication Sig Dispense Auth. Provider   ciprofloxacin (CILOXAN) 0.3 % ophthalmic ointment One gtt  in each eye q 2 h while awake for 2 days,then q 4h x 5 days 3.5 g Rodriguez-Southworth, Rebekha Diveley, PA-C   benzonatate (TESSALON) 200 MG capsule Take 1 capsule (200 mg total) by mouth 3 (three) times daily as needed for cough. 30 capsule Rodriguez-Southworth, Nettie Elm, PA-C   fexofenadine (ALLEGRA ALLERGY) 180 MG tablet Take 1 tablet (180 mg total) by mouth daily. 30 tablet Rodriguez-Southworth, Nettie Elm, PA-C   predniSONE (DELTASONE) 20 MG tablet Take 1 tablet (20 mg total) by mouth daily with breakfast. 3 tablet Rodriguez-Southworth, Nettie Elm, PA-C      PDMP not reviewed this encounter.   Garey Ham, PA-C 06/23/21 1352

## 2021-06-23 NOTE — Discharge Instructions (Addendum)
If you can you may try doing Saline rinses of your nose twice a day for 5-7 days to clear out drainage. ?I will have you try a few days of prednisone to help with the horseness ? ?The Benzoate perless is for the tickle in your throat and cough, and the Allegra for drainage and allergies.  ?

## 2021-07-07 ENCOUNTER — Encounter: Payer: Self-pay | Admitting: Internal Medicine

## 2021-07-12 ENCOUNTER — Other Ambulatory Visit: Payer: Self-pay

## 2021-07-12 DIAGNOSIS — E7800 Pure hypercholesterolemia, unspecified: Secondary | ICD-10-CM

## 2021-07-12 DIAGNOSIS — E039 Hypothyroidism, unspecified: Secondary | ICD-10-CM

## 2021-07-12 DIAGNOSIS — I10 Essential (primary) hypertension: Secondary | ICD-10-CM

## 2021-07-12 NOTE — Telephone Encounter (Signed)
Labs changed to [facility] per pt request ?

## 2021-07-12 NOTE — Telephone Encounter (Signed)
Patient returned office phone call, MyChart message read. Patient will go to [facility] to have labs done. Please change orders for [facility]. ?

## 2021-07-13 ENCOUNTER — Telehealth: Payer: Self-pay | Admitting: Internal Medicine

## 2021-07-13 DIAGNOSIS — E7800 Pure hypercholesterolemia, unspecified: Secondary | ICD-10-CM

## 2021-07-13 DIAGNOSIS — I10 Essential (primary) hypertension: Secondary | ICD-10-CM

## 2021-07-13 DIAGNOSIS — E039 Hypothyroidism, unspecified: Secondary | ICD-10-CM

## 2021-07-13 NOTE — Telephone Encounter (Signed)
Orders placed for f/u labs.  

## 2021-07-14 ENCOUNTER — Ambulatory Visit
Admission: RE | Admit: 2021-07-14 | Discharge: 2021-07-14 | Disposition: A | Discharge status: Home / Self Care | Payer: Managed Care, Other (non HMO) | Source: Ambulatory Visit | Attending: Internal Medicine | Admitting: Internal Medicine

## 2021-07-14 ENCOUNTER — Ambulatory Visit: Payer: Managed Care, Other (non HMO)

## 2021-07-14 ENCOUNTER — Ambulatory Visit
Admission: RE | Admit: 2021-07-14 | Discharge: 2021-07-14 | Disposition: A | Discharge status: Home / Self Care | Payer: Managed Care, Other (non HMO) | Attending: Internal Medicine | Admitting: Internal Medicine

## 2021-07-14 ENCOUNTER — Encounter: Payer: Self-pay | Admitting: Internal Medicine

## 2021-07-14 ENCOUNTER — Ambulatory Visit: Payer: Managed Care, Other (non HMO) | Admitting: Internal Medicine

## 2021-07-14 DIAGNOSIS — I10 Essential (primary) hypertension: Secondary | ICD-10-CM | POA: Diagnosis not present

## 2021-07-14 DIAGNOSIS — R059 Cough, unspecified: Secondary | ICD-10-CM

## 2021-07-14 DIAGNOSIS — E039 Hypothyroidism, unspecified: Secondary | ICD-10-CM | POA: Diagnosis not present

## 2021-07-14 DIAGNOSIS — K2100 Gastro-esophageal reflux disease with esophagitis, without bleeding: Secondary | ICD-10-CM | POA: Diagnosis not present

## 2021-07-14 DIAGNOSIS — Z86010 Personal history of colonic polyps: Secondary | ICD-10-CM

## 2021-07-14 DIAGNOSIS — E7800 Pure hypercholesterolemia, unspecified: Secondary | ICD-10-CM

## 2021-07-14 MED ORDER — PREDNISONE 10 MG PO TABS: ORAL_TABLET | ORAL | 0 refills | Status: DC

## 2021-07-14 NOTE — Patient Instructions (Signed)
Saline nasal spray - flush nose at least 2x/day  Nasacort nasal spray - 2 sprays each nostril one time per day.  Do this in the evening.  

## 2021-07-14 NOTE — Progress Notes (Signed)
Patient ID: Kristine Hendricks, female   DOB: [birth year], 56 y.o.   MRN: [id number] ? ? ?Subjective:  ? ? Patient ID: Kristine Hendricks, female    DOB: 1965/05/17, 56 y.o.   MRN: [id number] ? ?This visit occurred during the SARS-CoV-2 public health emergency.  Safety protocols were in place, including screening questions prior to the visit, additional usage of staff PPE, and extensive cleaning of exam room while observing appropriate contact time as indicated for disinfecting solutions.  ? ?Patient here for scheduled follow up.  ? ?Chief Complaint  ?Patient presents with  ? Follow-up  ?  F/u for sore throat, fullness in ears, sinus congestion, slight cough and hoarseness. Pt denies fever. Pt was tested for COVID, NEG.  ? .  ? ?HPI ?Evaluated 06/23/21 - sore throat.  Rapid strep negative.  Diagnosed with pharyngitis and bilateral bacterial conjunctivitis.  Treated with cipro eye drops and allega with tessalon perles.  Reports persistent symptoms.  Ears blocked.  No sinus pressure.  No post nasal drainage.  Cough - occasionally productive - first am.  No chest pain or sob.  No acid reflux reported.  No abdominal pain.  Bowels moving.   ? ? ?Past Medical History:  ?Diagnosis Date  ? Anemia   ? Asthma   ? Basal cell carcinoma 04/09/2019  ? left lateral calf  ? GERD (gastroesophageal reflux disease)   ? History of chicken pox   ? Hypercholesterolemia   ? Hypertension   ? Hypothyroidism   ? Seasonal allergies   ? Sleep apnea   ? ?Past Surgical History:  ?Procedure Laterality Date  ? arthroscopic surgery    ? Dr Sabra Heck  ? BREAST CYST ASPIRATION Right 2005  ? BREAST SURGERY  4 /05  ? biopsy  ? CHOLECYSTECTOMY  2007  ? COLONOSCOPY WITH PROPOFOL N/A 02/14/2016  ? Procedure: COLONOSCOPY WITH PROPOFOL;  Surgeon: Manya Silvas, MD;  Location: [facility] ENDOSCOPY;  Service: Endoscopy;  Laterality: N/A;  ? DILATION AND CURETTAGE OF UTERUS  8/04  ? LAPAROSCOPIC GASTRIC BANDING  01/29/09  ? MYOMECTOMY  9/04  ? VAGINAL HYSTERECTOMY  06/11/03   ? ?Family History  ?Problem Relation Age of Onset  ? Lung cancer Mother   ? Arthritis Mother   ? Stroke Mother   ? Hypertension Mother   ? Colon polyps Mother   ? Thyroid disease Sister   ?     graves disease  ? Colon cancer Other   ?     maternal grandparent  ? Hyperlipidemia Father   ? Stroke Maternal Aunt   ? Stroke Maternal Uncle   ? Cancer Maternal Grandmother   ?     colon  ? Alcohol abuse Cousin   ? Breast cancer Neg Hx   ? Heart disease Neg Hx   ? ?Social History  ? ?Socioeconomic History  ? Marital status: Single  ?  Spouse name: Not on file  ? Number of children: 0  ? Years of education: Not on file  ? Highest education level: Not on file  ?Occupational History  ?  Employer: [employer]  ?Tobacco Use  ? Smoking status: Never  ? Smokeless tobacco: Never  ?Substance and Sexual Activity  ? Alcohol use: No  ?  Alcohol/week: 0.0 standard drinks  ?  Comment: 1 drink/month  ? Drug use: No  ? Sexual activity: Not on file  ?Other Topics Concern  ? Not on file  ?Social History Narrative  ? Not on file  ? ?  Social Determinants of Health  ? ?Financial Resource Strain: Not on file  ?Food Insecurity: Not on file  ?Transportation Needs: Not on file  ?Physical Activity: Not on file  ?Stress: Not on file  ?Social Connections: Not on file  ? ? ? ?Review of Systems  ?Constitutional:  Negative for appetite change and unexpected weight change.  ?HENT:  Positive for congestion. Negative for sinus pressure.   ?Respiratory:  Positive for cough. Negative for chest tightness and shortness of breath.   ?Cardiovascular:  Negative for chest pain, palpitations and leg swelling.  ?Gastrointestinal:  Negative for abdominal pain, diarrhea, nausea and vomiting.  ?Genitourinary:  Negative for difficulty urinating and dysuria.  ?Musculoskeletal:  Negative for joint swelling and myalgias.  ?Skin:  Negative for color change and rash.  ?Neurological:  Negative for dizziness, light-headedness and headaches.  ?Psychiatric/Behavioral:  Negative for  agitation and dysphoric mood.   ? ?   ?Objective:  ?  ? ?BP 128/78 (BP Location: Left Arm, Patient Position: Sitting, Cuff Size: Large)   Pulse 71   Temp 98.4 ?F (36.9 ?C) (Temporal)   Resp 12   Ht '5\' 4"'$  (1.626 m)   Wt (!) 317 lb (143.8 kg)   LMP 06/11/2003   SpO2 97%   BMI 54.41 kg/m?  ?Wt Readings from Last 3 Encounters:  ?07/14/21 (!) 317 lb (143.8 kg)  ?01/27/20 (!) 312 lb (141.5 kg)  ?08/15/19 (!) 326 lb (147.9 kg)  ? ? ?Physical Exam ?Vitals reviewed.  ?Constitutional:   ?   General: She is not in acute distress. ?   Appearance: Normal appearance.  ?HENT:  ?   Head: Normocephalic and atraumatic.  ?   Right Ear: External ear normal.  ?   Left Ear: External ear normal.  ?Eyes:  ?   General: No scleral icterus.    ?   Right eye: No discharge.     ?   Left eye: No discharge.  ?   Conjunctiva/sclera: Conjunctivae normal.  ?Neck:  ?   Thyroid: No thyromegaly.  ?Cardiovascular:  ?   Rate and Rhythm: Normal rate and regular rhythm.  ?Pulmonary:  ?   Effort: No respiratory distress.  ?   Breath sounds: Normal breath sounds. No wheezing.  ?Abdominal:  ?   General: Bowel sounds are normal.  ?   Palpations: Abdomen is soft.  ?   Tenderness: There is no abdominal tenderness.  ?Musculoskeletal:     ?   General: No swelling or tenderness.  ?   Cervical back: Neck supple. No tenderness.  ?Lymphadenopathy:  ?   Cervical: No cervical adenopathy.  ?Skin: ?   Findings: No erythema or rash.  ?Neurological:  ?   Mental Status: She is alert.  ?Psychiatric:     ?   Mood and Affect: Mood normal.     ?   Behavior: Behavior normal.  ? ? ? ?Outpatient Encounter Medications as of 07/14/2021  ?Medication Sig  ? atorvastatin (LIPITOR) 10 MG tablet TAKE 1 TABLET BY MOUTH  DAILY  ? benzonatate (TESSALON) 200 MG capsule Take 1 capsule (200 mg total) by mouth 3 (three) times daily as needed for cough.  ? CALCIUM PO Take 300 mg by mouth 4 (four) times daily.  ? fexofenadine (ALLEGRA ALLERGY) 180 MG tablet Take 1 tablet (180 mg total) by  mouth daily.  ? levothyroxine (SYNTHROID) 75 MCG tablet TAKE 1 TABLET(75 MCG) BY MOUTH DAILY  ? losartan (COZAAR) 100 MG tablet TAKE 1 TABLET BY MOUTH  DAILY  ? Multiple Vitamin (MULTIVITAMIN PO) Take by mouth daily.  ? pantoprazole (PROTONIX) 40 MG tablet TAKE 1 TABLET BY MOUTH  DAILY  ? predniSONE (DELTASONE) 10 MG tablet Take 4 tablets x 1 day and then decrease by 1/2 tablet per day until down to zero mg.  ? venlafaxine XR (EFFEXOR-XR) 37.5 MG 24 hr capsule TAKE 1 CAPSULE BY MOUTH  DAILY WITH BREAKFAST  ? [DISCONTINUED] azelastine (ASTELIN) 0.1 % nasal spray Place 1 spray into both nostrils 2 (two) times daily. Use in each nostril as directed  ? [DISCONTINUED] ciprofloxacin (CILOXAN) 0.3 % ophthalmic ointment One gtt in each eye q 2 h while awake for 2 days,then q 4h x 5 days  ? [DISCONTINUED] doxycycline (PERIOSTAT) 20 MG tablet Take 1 tablet (20 mg total) by mouth 2 (two) times daily. Take with food  ? [DISCONTINUED] meloxicam (MOBIC) 15 MG tablet Take 15 mg by mouth daily.  ? [DISCONTINUED] predniSONE (DELTASONE) 20 MG tablet Take 1 tablet (20 mg total) by mouth daily with breakfast.  ? ?No facility-administered encounter medications on file as of 07/14/2021.  ?  ? ?Lab Results  ?Component Value Date  ? WBC 7.5 04/09/2019  ? HGB 13.9 04/09/2019  ? HCT 41.8 04/09/2019  ? PLT 294 04/09/2019  ? GLUCOSE 89 12/19/2019  ? CHOL 169 12/19/2019  ? TRIG 117 12/19/2019  ? HDL 45 12/19/2019  ? LDLCALC 103 (H) 12/19/2019  ? ALT 13 12/19/2019  ? AST 18 12/19/2019  ? NA 142 12/19/2019  ? K 4.6 12/19/2019  ? CL 102 12/19/2019  ? CREATININE 0.81 12/19/2019  ? BUN 17 12/19/2019  ? CO2 27 12/19/2019  ? TSH 0.678 12/19/2019  ? HGBA1C 5.3 03/19/2015  ? ? ?   ?Assessment & Plan:  ? ?Problem List Items Addressed This Visit   ? ? Cough  ?  Persistent cough as outlined.  Will check cxr.  Treated with prednisone taper as directed.  Saline nasal spray and steroid nasal spray as directed.  Follow.  Call with update.  ? ?  ?  ? Relevant  Orders  ? DG Chest 2 View (Completed)  ? GERD (gastroesophageal reflux disease)  ?  Upper symptoms controlled. On protonix.  ? ?  ?  ? History of colonic polyps  ?  Colonoscopy 02/14/16 - two diminutive polyps in

## 2021-07-15 ENCOUNTER — Telehealth: Payer: Self-pay | Admitting: Internal Medicine

## 2021-07-15 NOTE — Telephone Encounter (Signed)
Lm for pt to cb.

## 2021-07-15 NOTE — Telephone Encounter (Signed)
Pt returning call

## 2021-07-15 NOTE — Telephone Encounter (Signed)
Pt returning call. Pt requesting callback.  °

## 2021-07-18 ENCOUNTER — Encounter: Payer: Self-pay | Admitting: Internal Medicine

## 2021-07-18 NOTE — Assessment & Plan Note (Signed)
On lipitor.  Low cholesterol diet and exercise.  Follow lipid panel and liver function tests.   

## 2021-07-18 NOTE — Telephone Encounter (Signed)
S/w pt  advised of results ?

## 2021-07-18 NOTE — Assessment & Plan Note (Signed)
Upper symptoms controlled.  On protonix.  

## 2021-07-18 NOTE — Assessment & Plan Note (Signed)
On thyroid replacement.  Follow tsh.  

## 2021-07-18 NOTE — Assessment & Plan Note (Signed)
Blood pressures on outside checks:  127/78-84.  Continue to spot check pressures.  On no medication.  Follow pressures.  Follow metabolic panel.  ?

## 2021-07-18 NOTE — Assessment & Plan Note (Signed)
BMI 54.  Diet and exercise.  Follow.  ?

## 2021-07-18 NOTE — Assessment & Plan Note (Addendum)
Persistent cough as outlined.  Will check cxr.  Treated with prednisone taper as directed.  Saline nasal spray and steroid nasal spray as directed.  Follow.  Call with update.  ?

## 2021-07-18 NOTE — Assessment & Plan Note (Addendum)
Colonoscopy 02/14/16 - two diminutive polyps in the rectum and internal hemorrhoids.  Pathology - hyperplastic rectal polyp.  Recommended f/u in 5 years.  

## 2021-08-03 ENCOUNTER — Encounter: Payer: Self-pay | Admitting: Internal Medicine

## 2021-08-05 ENCOUNTER — Encounter: Payer: Self-pay | Admitting: Internal Medicine

## 2021-08-05 ENCOUNTER — Ambulatory Visit: Payer: Managed Care, Other (non HMO) | Admitting: Internal Medicine

## 2021-08-05 DIAGNOSIS — E039 Hypothyroidism, unspecified: Secondary | ICD-10-CM

## 2021-08-05 DIAGNOSIS — E7800 Pure hypercholesterolemia, unspecified: Secondary | ICD-10-CM

## 2021-08-05 DIAGNOSIS — K2100 Gastro-esophageal reflux disease with esophagitis, without bleeding: Secondary | ICD-10-CM | POA: Diagnosis not present

## 2021-08-05 DIAGNOSIS — I10 Essential (primary) hypertension: Secondary | ICD-10-CM

## 2021-08-05 DIAGNOSIS — R059 Cough, unspecified: Secondary | ICD-10-CM

## 2021-08-05 MED ORDER — AZITHROMYCIN 250 MG PO TABS: ORAL_TABLET | ORAL | 0 refills | Status: AC

## 2021-08-05 MED ORDER — PREDNISONE 10 MG PO TABS: ORAL_TABLET | ORAL | 0 refills | Status: DC

## 2021-08-05 NOTE — Progress Notes (Signed)
Patient ID: Kristine Hendricks, female   DOB: 10-07-1965, 56 y.o.   MRN: [id number]   Subjective:    Patient ID: Kristine Hendricks, female    DOB: Jul 14, 1965, 56 y.o.   MRN: [id number]   Patient here for a scheduled follow up.   Chief Complaint  Patient presents with   Follow-up    Follow up for cough, sinus congestion, headache, runny eyes.   Marland Kitchen   HPI Evaluated 06/23/21 - sore throat.  Rapid strep negative.  Diagnosed with pharyngitis and bilateral bacterial conjunctivitis.  Treated with cipro eye drops and allega with tessalon perles. Reevaluated 07/14/21 - persistent symptoms, ear fullness and cough.  CXR ok. Treated with prednisone taper and steroid nasal spray.  Better initially.  Symptoms never completely resolved.  With persistent symptoms that have increased recently.  Persistent cough, eyes draining and ear fullness.  No chest pain or increased sob.  Eating.  No vomiting.     Past Medical History:  Diagnosis Date   Anemia    Asthma    Basal cell carcinoma 04/09/2019   left lateral calf   GERD (gastroesophageal reflux disease)    History of chicken pox    Hypercholesterolemia    Hypertension    Hypothyroidism    Seasonal allergies    Sleep apnea    Past Surgical History:  Procedure Laterality Date   arthroscopic surgery     Dr Sabra Heck   BREAST CYST ASPIRATION Right 2005   BREAST SURGERY  4 /05   biopsy   CHOLECYSTECTOMY  2007   COLONOSCOPY WITH PROPOFOL N/A 02/14/2016   Procedure: COLONOSCOPY WITH PROPOFOL;  Surgeon: Manya Silvas, MD;  Location: [facility];  Service: Endoscopy;  Laterality: N/A;   DILATION AND CURETTAGE OF UTERUS  8/04   LAPAROSCOPIC GASTRIC BANDING  01/29/09   MYOMECTOMY  9/04   VAGINAL HYSTERECTOMY  06/11/03   Family History  Problem Relation Age of Onset   Lung cancer Mother    Arthritis Mother    Stroke Mother    Hypertension Mother    Colon polyps Mother    Thyroid disease Sister        graves disease   Colon cancer Other         maternal grandparent   Hyperlipidemia Father    Stroke Maternal Aunt    Stroke Maternal Uncle    Cancer Maternal Grandmother        colon   Alcohol abuse Cousin    Breast cancer Neg Hx    Heart disease Neg Hx    Social History   Socioeconomic History   Marital status: Single    Spouse name: Not on file   Number of children: 0   Years of education: Not on file   Highest education level: Not on file  Occupational History    Employer: [employer]  Tobacco Use   Smoking status: Never   Smokeless tobacco: Never  Substance and Sexual Activity   Alcohol use: No    Alcohol/week: 0.0 standard drinks    Comment: 1 drink/month   Drug use: No   Sexual activity: Not on file  Other Topics Concern   Not on file  Social History Narrative   Not on file   Social Determinants of Health   Financial Resource Strain: Not on file  Food Insecurity: Not on file  Transportation Needs: Not on file  Physical Activity: Not on file  Stress: Not on file  Social Connections: Not on file  Review of Systems  Constitutional:  Negative for appetite change and fever.  HENT:  Positive for congestion and postnasal drip.        Ear fullness.   Respiratory:  Positive for cough. Negative for chest tightness and shortness of breath.   Cardiovascular:  Negative for chest pain, palpitations and leg swelling.  Gastrointestinal:  Negative for abdominal pain, diarrhea, nausea and vomiting.  Genitourinary:  Negative for difficulty urinating and dysuria.  Musculoskeletal:  Negative for joint swelling and myalgias.  Skin:  Negative for color change and rash.  Neurological:  Negative for dizziness, light-headedness and headaches.  Psychiatric/Behavioral:  Negative for agitation and dysphoric mood.       Objective:     BP 120/84 (BP Location: Left Arm, Patient Position: Sitting, Cuff Size: Large)   Pulse 64   Temp 99 F (37.2 C) (Temporal)   Resp 15   Ht '5\' 4"'$  (1.626 m)   Wt (!) 322 lb 6.4 oz (146.2  kg)   LMP 06/11/2003   SpO2 96%   BMI 55.34 kg/m  Wt Readings from Last 3 Encounters:  08/05/21 (!) 322 lb 6.4 oz (146.2 kg)  07/14/21 (!) 317 lb (143.8 kg)  01/27/20 (!) 312 lb (141.5 kg)    Physical Exam Vitals reviewed.  Constitutional:      General: She is not in acute distress.    Appearance: Normal appearance.  HENT:     Head: Normocephalic and atraumatic.     Right Ear: External ear normal.     Left Ear: External ear normal.  Eyes:     General: No scleral icterus.       Right eye: No discharge.        Left eye: No discharge.     Conjunctiva/sclera: Conjunctivae normal.  Neck:     Thyroid: No thyromegaly.  Cardiovascular:     Rate and Rhythm: Normal rate and regular rhythm.  Pulmonary:     Effort: No respiratory distress.     Breath sounds: Normal breath sounds. No wheezing.     Comments: Some increased cough with forced expiration.  Abdominal:     General: Bowel sounds are normal.     Palpations: Abdomen is soft.     Tenderness: There is no abdominal tenderness.  Musculoskeletal:        General: No swelling or tenderness.     Cervical back: Neck supple. No tenderness.  Lymphadenopathy:     Cervical: No cervical adenopathy.  Skin:    Findings: No erythema or rash.  Neurological:     Mental Status: She is alert.  Psychiatric:        Mood and Affect: Mood normal.        Behavior: Behavior normal.     Outpatient Encounter Medications as of 08/05/2021  Medication Sig   atorvastatin (LIPITOR) 10 MG tablet TAKE 1 TABLET BY MOUTH  DAILY   azithromycin (ZITHROMAX) 250 MG tablet Take 2 tablets on day 1, then 1 tablet daily on days 2 through 5   benzonatate (TESSALON) 200 MG capsule Take 1 capsule (200 mg total) by mouth 3 (three) times daily as needed for cough.   CALCIUM PO Take 300 mg by mouth 4 (four) times daily.   fexofenadine (ALLEGRA ALLERGY) 180 MG tablet Take 1 tablet (180 mg total) by mouth daily.   levothyroxine (SYNTHROID) 75 MCG tablet TAKE 1  TABLET(75 MCG) BY MOUTH DAILY   losartan (COZAAR) 100 MG tablet TAKE 1 TABLET BY MOUTH  DAILY  Multiple Vitamin (MULTIVITAMIN PO) Take by mouth daily.   pantoprazole (PROTONIX) 40 MG tablet TAKE 1 TABLET BY MOUTH  DAILY   predniSONE (DELTASONE) 10 MG tablet Take 4 tablets x 1 day and then decrease by 1/2 tablet per day until down to zero mg.   predniSONE (DELTASONE) 10 MG tablet Take 6 tablets x 1 day and then decrease by 1/2 tablet per day until down to zero mg.   venlafaxine XR (EFFEXOR-XR) 37.5 MG 24 hr capsule TAKE 1 CAPSULE BY MOUTH  DAILY WITH BREAKFAST   No facility-administered encounter medications on file as of 08/05/2021.     Lab Results  Component Value Date   WBC 7.5 04/09/2019   HGB 13.9 04/09/2019   HCT 41.8 04/09/2019   PLT 294 04/09/2019   GLUCOSE 89 12/19/2019   CHOL 169 12/19/2019   TRIG 117 12/19/2019   HDL 45 12/19/2019   LDLCALC 103 (H) 12/19/2019   ALT 13 12/19/2019   AST 18 12/19/2019   NA 142 12/19/2019   K 4.6 12/19/2019   CL 102 12/19/2019   CREATININE 0.81 12/19/2019   BUN 17 12/19/2019   CO2 27 12/19/2019   TSH 0.678 12/19/2019   HGBA1C 5.3 03/19/2015    DG Chest 2 View  Result Date: 07/15/2021 CLINICAL DATA:  56 year old female with persisting cough and congestion EXAM: CHEST - 2 VIEW COMPARISON:  07/21/2015 FINDINGS: Cardiomediastinal silhouette unchanged in size and contour. No evidence of central vascular congestion. No interlobular septal thickening. No pneumothorax or pleural effusion. Coarsened interstitial markings, with no confluent airspace disease. No acute displaced fracture. Degenerative changes of the spine. Lap band of the upper abdomen IMPRESSION: No active cardiopulmonary disease. Electronically Signed   By: Corrie Mckusick D.O.   On: 07/15/2021 12:40       Assessment & Plan:   Problem List Items Addressed This Visit     Cough    Persistent symptoms with cough as outlined.  Recent cxr ok.  No chest pain or sob.  Treat with  prednisone taper as directed.  Zpak.  Robitussin DM/nasal spray.  Follow.  Call with update.  If persistent symptoms, may need pulmonary referral for PFTs, etc.        GERD (gastroesophageal reflux disease)    Upper symptoms controlled. On protonix.        Hypercholesterolemia    On lipitor.  Low cholesterol diet and exercise.  Follow lipid panel and liver function tests.         Hypertension    Blood pressures have been doing well per report.   Continue to spot check pressures.  On no medication.  Follow pressures.  Follow metabolic panel.        Hypothyroid    On thyroid replacement.  Follow tsh.          Einar Pheasant, MD

## 2021-08-05 NOTE — Patient Instructions (Addendum)
Nasacort nasal spray - 2 sprays each nostril one time per day.  Do this in the evening.    Take a probiotic daily while you are on the antibiotic and for two weeks after completing the antibiotic.

## 2021-08-07 ENCOUNTER — Encounter: Payer: Self-pay | Admitting: Internal Medicine

## 2021-08-07 NOTE — Assessment & Plan Note (Signed)
Blood pressures have been doing well per report.   Continue to spot check pressures.  On no medication.  Follow pressures.  Follow metabolic panel.

## 2021-08-07 NOTE — Assessment & Plan Note (Signed)
Persistent symptoms with cough as outlined.  Recent cxr ok.  No chest pain or sob.  Treat with prednisone taper as directed.  Zpak.  Robitussin DM/nasal spray.  Follow.  Call with update.  If persistent symptoms, may need pulmonary referral for PFTs, etc.

## 2021-08-07 NOTE — Assessment & Plan Note (Signed)
Upper symptoms controlled.  On protonix.  

## 2021-08-07 NOTE — Assessment & Plan Note (Signed)
On thyroid replacement.  Follow tsh.  

## 2021-08-07 NOTE — Assessment & Plan Note (Signed)
On lipitor.  Low cholesterol diet and exercise.  Follow lipid panel and liver function tests.   

## 2021-08-10 ENCOUNTER — Other Ambulatory Visit: Payer: Self-pay | Admitting: General Surgery

## 2021-08-10 DIAGNOSIS — R928 Other abnormal and inconclusive findings on diagnostic imaging of breast: Secondary | ICD-10-CM

## 2021-09-21 ENCOUNTER — Ambulatory Visit
Admission: RE | Admit: 2021-09-21 | Discharge: 2021-09-21 | Disposition: A | Discharge status: Home / Self Care | Payer: Managed Care, Other (non HMO) | Source: Ambulatory Visit | Attending: General Surgery | Admitting: General Surgery

## 2021-09-21 ENCOUNTER — Encounter: Payer: Self-pay | Admitting: Internal Medicine

## 2021-09-21 DIAGNOSIS — R928 Other abnormal and inconclusive findings on diagnostic imaging of breast: Secondary | ICD-10-CM

## 2021-09-22 ENCOUNTER — Ambulatory Visit: Payer: Managed Care, Other (non HMO) | Admitting: Internal Medicine

## 2021-09-22 ENCOUNTER — Ambulatory Visit: Payer: Managed Care, Other (non HMO)

## 2021-09-22 ENCOUNTER — Encounter: Payer: Self-pay | Admitting: Internal Medicine

## 2021-09-22 ENCOUNTER — Ambulatory Visit
Admission: RE | Admit: 2021-09-22 | Discharge: 2021-09-22 | Disposition: A | Discharge status: Home / Self Care | Payer: Managed Care, Other (non HMO) | Source: Ambulatory Visit | Attending: Internal Medicine | Admitting: Internal Medicine

## 2021-09-22 ENCOUNTER — Ambulatory Visit
Admission: RE | Admit: 2021-09-22 | Discharge: 2021-09-22 | Disposition: A | Discharge status: Home / Self Care | Payer: Managed Care, Other (non HMO) | Attending: Internal Medicine | Admitting: Internal Medicine

## 2021-09-22 VITALS — BP 140/82 | HR 82 | Temp 98.5°F | Resp 19 | Ht 64.0 in | Wt 324.8 lb

## 2021-09-22 DIAGNOSIS — R059 Cough, unspecified: Secondary | ICD-10-CM | POA: Insufficient documentation

## 2021-09-22 DIAGNOSIS — R198 Other specified symptoms and signs involving the digestive system and abdomen: Secondary | ICD-10-CM

## 2021-09-22 DIAGNOSIS — H938X9 Other specified disorders of ear, unspecified ear: Secondary | ICD-10-CM | POA: Diagnosis not present

## 2021-09-22 DIAGNOSIS — I10 Essential (primary) hypertension: Secondary | ICD-10-CM

## 2021-09-22 DIAGNOSIS — K2100 Gastro-esophageal reflux disease with esophagitis, without bleeding: Secondary | ICD-10-CM

## 2021-09-22 MED ORDER — ALBUTEROL SULFATE HFA 108 (90 BASE) MCG/ACT IN AERS: 2.0000 | INHALATION_SPRAY | Freq: Four times a day (QID) | RESPIRATORY_TRACT | 0 refills | Status: DC | PRN

## 2021-09-22 MED ORDER — PREDNISONE 10 MG PO TABS: ORAL_TABLET | ORAL | 0 refills | Status: DC

## 2021-09-22 NOTE — Progress Notes (Signed)
Patient ID: Kristine Hendricks, female   DOB: September 09, 1965, 56 y.o.   MRN: [id number]   Subjective:    Patient ID: Kristine Hendricks, female    DOB: Jan 03, 1966, 56 y.o.   MRN: [id number]   Patient here for work in appt.   HPI Work in for increased cough and congestion.  Was seen 08/05/21 - for persistent cough and congestion.  Symptoms had started with sore throat, etc 06/2021.  Was treated with zpak and prednisone.  Symptoms improved.  States had a couple of really good days and then noticed some throat irritation.  Went on a cruise - 09/03/21 - 09/17/21. States had no problems on her cruise.  When returned home, noticed increased drainage.  Sore throat.  Hoarse.  Headache.  Drainage has persisted.  Headache is better.  Sore throat is better.  Sinus pressure.  Eyes watering.  Some chest congestion.  Increased cough.  Coughing fits.  Some wheezing.  Decreased appetite.  She is eating.  Emesis x 3.  Using neti pot.  Some abdominal fullness/discomfort - notices more when bends.  Previously felt to be aggravated by coughing.  Persistent.  Discussed today further w/up.  No acid reflux.  Has taken 3 home covid tests - negative.    Past Medical History:  Diagnosis Date   Anemia    Asthma    Basal cell carcinoma 04/09/2019   left lateral calf   GERD (gastroesophageal reflux disease)    History of chicken pox    Hypercholesterolemia    Hypertension    Hypothyroidism    Seasonal allergies    Sleep apnea    Past Surgical History:  Procedure Laterality Date   arthroscopic surgery     Dr Sabra Heck   BREAST CYST ASPIRATION Right 2005   BREAST SURGERY  4 /05   biopsy   CHOLECYSTECTOMY  2007   COLONOSCOPY WITH PROPOFOL N/A 02/14/2016   Procedure: COLONOSCOPY WITH PROPOFOL;  Surgeon: Manya Silvas, MD;  Location: [facility];  Service: Endoscopy;  Laterality: N/A;   DILATION AND CURETTAGE OF UTERUS  8/04   LAPAROSCOPIC GASTRIC BANDING  01/29/09   MYOMECTOMY  9/04   VAGINAL HYSTERECTOMY  06/11/03    Family History  Problem Relation Age of Onset   Lung cancer Mother    Arthritis Mother    Stroke Mother    Hypertension Mother    Colon polyps Mother    Thyroid disease Sister        graves disease   Colon cancer Other        maternal grandparent   Hyperlipidemia Father    Stroke Maternal Aunt    Stroke Maternal Uncle    Cancer Maternal Grandmother        colon   Alcohol abuse Cousin    Breast cancer Neg Hx    Heart disease Neg Hx    Social History   Socioeconomic History   Marital status: Single    Spouse name: Not on file   Number of children: 0   Years of education: Not on file   Highest education level: Not on file  Occupational History    Employer: [employer]  Tobacco Use   Smoking status: Never   Smokeless tobacco: Never  Substance and Sexual Activity   Alcohol use: No    Alcohol/week: 0.0 standard drinks of alcohol    Comment: 1 drink/month   Drug use: No   Sexual activity: Not on file  Other Topics Concern  Not on file  Social History Narrative   Not on file   Social Determinants of Health   Financial Resource Strain: Not on file  Food Insecurity: Not on file  Transportation Needs: Not on file  Physical Activity: Not on file  Stress: Not on file  Social Connections: Not on file     Review of Systems  Constitutional:  Negative for fever.       Decreased appetite.   HENT:  Positive for congestion, postnasal drip, sinus pressure and sore throat.   Respiratory:  Positive for cough and wheezing. Negative for chest tightness.   Cardiovascular:  Negative for chest pain and palpitations.  Gastrointestinal:        Previous vomiting as outlined.  Abdominal fullness/discomfort - noticed more when bending.    Genitourinary:  Negative for difficulty urinating and dysuria.  Musculoskeletal:  Negative for joint swelling and myalgias.  Skin:  Negative for color change and rash.  Neurological:  Positive for headaches. Negative for dizziness.   Psychiatric/Behavioral:  Negative for agitation and dysphoric mood.        Objective:     BP 140/82 (BP Location: Left Arm, Patient Position: Sitting, Cuff Size: Large)   Pulse 82   Temp 98.5 F (36.9 C) (Temporal)   Resp 19   Ht '5\' 4"'$  (1.626 m)   Wt (!) 324 lb 12.8 oz (147.3 kg)   LMP 06/11/2003   SpO2 98%   BMI 55.75 kg/m  Wt Readings from Last 3 Encounters:  09/22/21 (!) 324 lb 12.8 oz (147.3 kg)  08/05/21 (!) 322 lb 6.4 oz (146.2 kg)  07/14/21 (!) 317 lb (143.8 kg)    Physical Exam Vitals reviewed.  Constitutional:      General: She is not in acute distress.    Appearance: Normal appearance.  HENT:     Head: Normocephalic and atraumatic.     Right Ear: Tympanic membrane and external ear normal.     Left Ear: Tympanic membrane and external ear normal.  Eyes:     General: No scleral icterus.       Right eye: No discharge.        Left eye: No discharge.     Conjunctiva/sclera: Conjunctivae normal.  Neck:     Thyroid: No thyromegaly.  Cardiovascular:     Rate and Rhythm: Normal rate and regular rhythm.  Pulmonary:     Effort: No respiratory distress.     Breath sounds: Normal breath sounds. No wheezing.  Abdominal:     General: Bowel sounds are normal.     Palpations: Abdomen is soft.     Comments: Minimal fullness/disc - mid abdomen.  No significant pain to palpation.  Unable to appreciate hernia.    Musculoskeletal:        General: No swelling or tenderness.     Cervical back: Neck supple. No tenderness.  Lymphadenopathy:     Cervical: No cervical adenopathy.  Skin:    Findings: No erythema or rash.  Neurological:     Mental Status: She is alert.  Psychiatric:        Mood and Affect: Mood normal.        Behavior: Behavior normal.      Outpatient Encounter Medications as of 09/22/2021  Medication Sig   albuterol (VENTOLIN HFA) 108 (90 Base) MCG/ACT inhaler Inhale 2 puffs into the lungs every 6 (six) hours as needed for wheezing or shortness of  breath.   atorvastatin (LIPITOR) 10 MG tablet TAKE 1  TABLET BY MOUTH  DAILY   CALCIUM PO Take 300 mg by mouth 4 (four) times daily.   levothyroxine (SYNTHROID) 75 MCG tablet TAKE 1 TABLET(75 MCG) BY MOUTH DAILY   losartan (COZAAR) 100 MG tablet TAKE 1 TABLET BY MOUTH  DAILY   Multiple Vitamin (MULTIVITAMIN PO) Take by mouth daily.   pantoprazole (PROTONIX) 40 MG tablet TAKE 1 TABLET BY MOUTH  DAILY   predniSONE (DELTASONE) 10 MG tablet Take 6 tablets x 1 day and then decrease by 1/2 tablet per day until down to zero mg.   venlafaxine XR (EFFEXOR-XR) 37.5 MG 24 hr capsule TAKE 1 CAPSULE BY MOUTH  DAILY WITH BREAKFAST   [DISCONTINUED] benzonatate (TESSALON) 200 MG capsule Take 1 capsule (200 mg total) by mouth 3 (three) times daily as needed for cough.   [DISCONTINUED] fexofenadine (ALLEGRA ALLERGY) 180 MG tablet Take 1 tablet (180 mg total) by mouth daily.   [DISCONTINUED] predniSONE (DELTASONE) 10 MG tablet Take 4 tablets x 1 day and then decrease by 1/2 tablet per day until down to zero mg.   [DISCONTINUED] predniSONE (DELTASONE) 10 MG tablet Take 6 tablets x 1 day and then decrease by 1/2 tablet per day until down to zero mg.   No facility-administered encounter medications on file as of 09/22/2021.     Lab Results  Component Value Date   WBC 7.5 04/09/2019   HGB 13.9 04/09/2019   HCT 41.8 04/09/2019   PLT 294 04/09/2019   GLUCOSE 89 12/19/2019   CHOL 169 12/19/2019   TRIG 117 12/19/2019   HDL 45 12/19/2019   LDLCALC 103 (H) 12/19/2019   ALT 13 12/19/2019   AST 18 12/19/2019   NA 142 12/19/2019   K 4.6 12/19/2019   CL 102 12/19/2019   CREATININE 0.81 12/19/2019   BUN 17 12/19/2019   CO2 27 12/19/2019   TSH 0.678 12/19/2019   HGBA1C 5.3 03/19/2015    MM DIAG BREAST TOMO UNI LEFT  Result Date: 09/21/2021 CLINICAL DATA:  Indeterminate LEFT breast asymmetry. Attempted stereotactic guided biopsy was recommended December 2022. Biopsy was deferred and short term follow-up was  elected EXAM: DIGITAL DIAGNOSTIC UNILATERAL LEFT MAMMOGRAM WITH TOMOSYNTHESIS AND CAD TECHNIQUE: Left digital diagnostic mammography and breast tomosynthesis was performed. The images were evaluated with computer-aided detection. COMPARISON:  Previous exam(s). ACR Breast Density Category b: There are scattered areas of fibroglandular density. FINDINGS: Diagnostic images of the LEFT breast demonstrate mildly decreased conspicuity of an asymmetry in the LEFT outer breast at middle depth. It is seen on CC view only. Fibroglandular tissue assumes a configuration more similar in comparison dating back to remote prior mammograms (for example CC slice 77 of 95 from June 01, 2017). There is increased suggestion of interdigitating fat (CC volume 2 slice 76). Is markedly less conspicuous on spot CC imaging the (spot CC volume 2 slice 69). IMPRESSION: Mildly decreased conspicuity of a LEFT breast asymmetry seen on CC view only. Given this area is more similar in appearance dating back to more remote prior mammograms on today's exam, this is favored to reflect baseline configuration of fibroglandular tissue. Discussion was held with patient regarding options for stereotactic guided biopsy versus continued short-term follow-up. Patient is comfortable with continued short-term follow-up. As such, recommend follow-up diagnostic mammogram in 6 months. This will establish 1 year of definitive stability. RECOMMENDATION: Bilateral diagnostic mammogram (with RIGHT and LEFT breast ultrasound if deemed necessary) in 6 months. I have discussed the findings and recommendations with the patient. If applicable,  a reminder letter will be sent to the patient regarding the next appointment. BI-RADS CATEGORY  3: Probably benign. Electronically Signed   By: Valentino Saxon M.D.   On: 09/21/2021 14:51      Assessment & Plan:   Problem List Items Addressed This Visit     Abdominal fullness    Abdominal fullness.  Cannot appreciate  significant abnormality on exam.  Question if could have small hernia.  Notices more when bending.  Discussed further w/up.  Abdominal ultrasound.       Relevant Orders   US Abdomen Complete   Cough - Primary    Persistent recurring symptoms.  Had no symptoms on her cruise.  Was questioning if allergy triggered - house.  No new pets, contacts, etc.  Acid reflux controlled.  Symptoms as outlined.  No fever.  Coughing fits. Increased drainage.  No significant chest pain.  Check cxr.  Hold abx.  Treat with prednisone taper as directed.  Saline nasal spray, steroid nasal spray as directed.  Robitussin DM.  Albuterol inhaler prn.  Follow closely.  Call with update.  May need pulmonary evaluation if persistent.  covid swab obtained.       Relevant Orders   Novel Coronavirus, NAA ([facility])   DG Chest 2 View (Completed)   Ear fullness    Some ear fullness.  Treat with prednisone and nasal sprays as outlined.  Follow.       GERD (gastroesophageal reflux disease)    Reports no acid reflux.  On protonix.       Hypertension    Elevated today.  Feel related to being sick.  Have her continue to spot check pressure.          Einar Pheasant, MD

## 2021-09-22 NOTE — Patient Instructions (Signed)
Continue rinsing your nose.   Nasacort nasal spray - 2 sprays each nostril one time per day.  Do this in the evening.   Prednisone taper as directed.    Albuterol inhaler - 2 puffs every 6 hours as needed.   Robitussin DM twice a day as needed for cough

## 2021-09-23 ENCOUNTER — Encounter: Payer: Self-pay | Admitting: Internal Medicine

## 2021-09-23 ENCOUNTER — Telehealth: Payer: Self-pay | Admitting: Internal Medicine

## 2021-09-23 ENCOUNTER — Other Ambulatory Visit: Payer: Self-pay | Admitting: Internal Medicine

## 2021-09-23 DIAGNOSIS — R198 Other specified symptoms and signs involving the digestive system and abdomen: Secondary | ICD-10-CM | POA: Insufficient documentation

## 2021-09-23 DIAGNOSIS — I517 Cardiomegaly: Secondary | ICD-10-CM

## 2021-09-23 LAB — NOVEL CORONAVIRUS, NAA: SARS-CoV-2, NAA: NOT DETECTED

## 2021-09-23 NOTE — Assessment & Plan Note (Signed)
Elevated today.  Feel related to being sick.  Have her continue to spot check pressure.

## 2021-09-23 NOTE — Assessment & Plan Note (Addendum)
Persistent recurring symptoms.  Had no symptoms on her cruise.  Was questioning if allergy triggered - house.  No new pets, contacts, etc.  Acid reflux controlled.  Symptoms as outlined.  No fever.  Coughing fits. Increased drainage.  No significant chest pain.  Check cxr.  Hold abx.  Treat with prednisone taper as directed.  Saline nasal spray, steroid nasal spray as directed.  Robitussin DM.  Albuterol inhaler prn.  Follow closely.  Call with update.  May need pulmonary evaluation if persistent.  covid swab obtained.

## 2021-09-23 NOTE — Assessment & Plan Note (Signed)
Abdominal fullness.  Cannot appreciate significant abnormality on exam.  Question if could have small hernia.  Notices more when bending.  Discussed further w/up.  Abdominal ultrasound.

## 2021-09-23 NOTE — Assessment & Plan Note (Signed)
Some ear fullness.  Treat with prednisone and nasal sprays as outlined.  Follow.

## 2021-09-23 NOTE — Progress Notes (Signed)
Order placed for echo.

## 2021-09-23 NOTE — Telephone Encounter (Signed)
Lft pt vm to call ofc to sch [facility]. thanks ?

## 2021-09-23 NOTE — Assessment & Plan Note (Signed)
Reports no acid reflux.  On protonix.  

## 2021-09-26 ENCOUNTER — Ambulatory Visit: Payer: Managed Care, Other (non HMO) | Admitting: Internal Medicine

## 2021-10-06 ENCOUNTER — Other Ambulatory Visit: Payer: Self-pay | Admitting: Internal Medicine

## 2021-10-06 DIAGNOSIS — K439 Ventral hernia without obstruction or gangrene: Secondary | ICD-10-CM

## 2021-10-06 DIAGNOSIS — R198 Other specified symptoms and signs involving the digestive system and abdomen: Secondary | ICD-10-CM

## 2021-10-07 ENCOUNTER — Ambulatory Visit
Admission: RE | Admit: 2021-10-07 | Discharge: 2021-10-07 | Disposition: A | Discharge status: Home / Self Care | Payer: Managed Care, Other (non HMO) | Source: Ambulatory Visit | Attending: Internal Medicine | Admitting: Internal Medicine

## 2021-10-07 DIAGNOSIS — R198 Other specified symptoms and signs involving the digestive system and abdomen: Secondary | ICD-10-CM | POA: Diagnosis present

## 2021-10-07 DIAGNOSIS — K439 Ventral hernia without obstruction or gangrene: Secondary | ICD-10-CM | POA: Diagnosis present

## 2021-10-13 ENCOUNTER — Ambulatory Visit (INDEPENDENT_AMBULATORY_CARE_PROVIDER_SITE_OTHER): Payer: Managed Care, Other (non HMO) | Admitting: Internal Medicine

## 2021-10-13 ENCOUNTER — Other Ambulatory Visit: Payer: Self-pay | Admitting: Internal Medicine

## 2021-10-13 ENCOUNTER — Encounter: Payer: Self-pay | Admitting: Internal Medicine

## 2021-10-13 VITALS — BP 136/88 | HR 69 | Temp 98.5°F | Ht 64.0 in | Wt 322.6 lb

## 2021-10-13 DIAGNOSIS — K2100 Gastro-esophageal reflux disease with esophagitis, without bleeding: Secondary | ICD-10-CM | POA: Diagnosis not present

## 2021-10-13 DIAGNOSIS — Z0000 Encounter for general adult medical examination without abnormal findings: Secondary | ICD-10-CM | POA: Diagnosis not present

## 2021-10-13 DIAGNOSIS — I10 Essential (primary) hypertension: Secondary | ICD-10-CM

## 2021-10-13 DIAGNOSIS — E039 Hypothyroidism, unspecified: Secondary | ICD-10-CM | POA: Diagnosis not present

## 2021-10-13 DIAGNOSIS — R059 Cough, unspecified: Secondary | ICD-10-CM

## 2021-10-13 DIAGNOSIS — E7800 Pure hypercholesterolemia, unspecified: Secondary | ICD-10-CM

## 2021-10-13 DIAGNOSIS — R928 Other abnormal and inconclusive findings on diagnostic imaging of breast: Secondary | ICD-10-CM

## 2021-10-13 NOTE — Progress Notes (Signed)
Patient ID: Kristine Hendricks, female   DOB: 02-Jul-1965, 56 y.o.   MRN: [id number]   Subjective:    Patient ID: Kristine Hendricks, female    DOB: 09/01/1965, 56 y.o.   MRN: [id number]   Patient here for her physical exam.   Chief Complaint  Patient presents with   Annual Exam   .   HPI Reports she is doing relatively well.  Was having issues with persistent cough.  Treated.  Symptoms improved when on trip.  Treated with prednisone taper.  Symptoms resolved.  Some symptoms starting - now off prednisone.  Question of allergy trigger.  Planning to travel soon.  Discussed monitoring symptoms while away.  No chest pain.  Breathing stable.  No abdominal pain.  No acid reflux reported.  Bowels stable.  States blood pressure averaging 120s/70-80.  Scheduled for echo - 10/27/21.    Past Medical History:  Diagnosis Date   Anemia    Asthma    Basal cell carcinoma 04/09/2019   left lateral calf   GERD (gastroesophageal reflux disease)    History of chicken pox    Hypercholesterolemia    Hypertension    Hypothyroidism    Seasonal allergies    Sleep apnea    Past Surgical History:  Procedure Laterality Date   arthroscopic surgery     Dr Sabra Heck   BREAST CYST ASPIRATION Right 2005   BREAST SURGERY  4 /05   biopsy   CHOLECYSTECTOMY  2007   COLONOSCOPY WITH PROPOFOL N/A 02/14/2016   Procedure: COLONOSCOPY WITH PROPOFOL;  Surgeon: Manya Silvas, MD;  Location: [facility];  Service: Endoscopy;  Laterality: N/A;   DILATION AND CURETTAGE OF UTERUS  8/04   LAPAROSCOPIC GASTRIC BANDING  01/29/09   MYOMECTOMY  9/04   VAGINAL HYSTERECTOMY  06/11/03   Family History  Problem Relation Age of Onset   Lung cancer Mother    Arthritis Mother    Stroke Mother    Hypertension Mother    Colon polyps Mother    Thyroid disease Sister        graves disease   Colon cancer Other        maternal grandparent   Hyperlipidemia Father    Stroke Maternal Aunt    Stroke Maternal Uncle    Cancer  Maternal Grandmother        colon   Alcohol abuse Cousin    Breast cancer Neg Hx    Heart disease Neg Hx    Social History   Socioeconomic History   Marital status: Single    Spouse name: Not on file   Number of children: 0   Years of education: Not on file   Highest education level: Not on file  Occupational History    Employer: [employer]  Tobacco Use   Smoking status: Never   Smokeless tobacco: Never  Substance and Sexual Activity   Alcohol use: No    Alcohol/week: 0.0 standard drinks of alcohol    Comment: 1 drink/month   Drug use: No   Sexual activity: Not on file  Other Topics Concern   Not on file  Social History Narrative   Not on file   Social Determinants of Health   Financial Resource Strain: Not on file  Food Insecurity: Not on file  Transportation Needs: Not on file  Physical Activity: Not on file  Stress: Not on file  Social Connections: Not on file     Review of Systems  Constitutional:  Negative  for appetite change and unexpected weight change.  HENT:  Negative for congestion, sinus pressure and sore throat.   Eyes:  Negative for pain and visual disturbance.  Respiratory:  Negative for chest tightness and shortness of breath.        Some congestion - starting back.    Cardiovascular:  Negative for chest pain, palpitations and leg swelling.  Gastrointestinal:  Negative for abdominal pain, diarrhea, nausea and vomiting.  Genitourinary:  Negative for difficulty urinating and dysuria.  Musculoskeletal:  Negative for joint swelling and myalgias.  Skin:  Negative for color change and rash.  Neurological:  Negative for dizziness, light-headedness and headaches.  Hematological:  Negative for adenopathy. Does not bruise/bleed easily.  Psychiatric/Behavioral:  Negative for agitation and dysphoric mood.        Objective:     BP 136/88   Pulse 69   Temp 98.5 F (36.9 C) (Oral)   Ht '5\' 4"'$  (1.626 m)   Wt (!) 322 lb 9.6 oz (146.3 kg)   LMP 06/11/2003    SpO2 99%   BMI 55.37 kg/m  Wt Readings from Last 3 Encounters:  10/13/21 (!) 322 lb 9.6 oz (146.3 kg)  09/22/21 (!) 324 lb 12.8 oz (147.3 kg)  08/05/21 (!) 322 lb 6.4 oz (146.2 kg)    Physical Exam Vitals reviewed.  Constitutional:      General: She is not in acute distress.    Appearance: Normal appearance. She is well-developed.  HENT:     Head: Normocephalic and atraumatic.     Right Ear: External ear normal.     Left Ear: External ear normal.  Eyes:     General: No scleral icterus.       Right eye: No discharge.        Left eye: No discharge.     Conjunctiva/sclera: Conjunctivae normal.  Neck:     Thyroid: No thyromegaly.  Cardiovascular:     Rate and Rhythm: Normal rate and regular rhythm.  Pulmonary:     Effort: No tachypnea, accessory muscle usage or respiratory distress.     Breath sounds: Normal breath sounds. No decreased breath sounds or wheezing.  Chest:  Breasts:    Right: No inverted nipple, mass, nipple discharge or tenderness (no axillary adenopathy).     Left: No inverted nipple, mass, nipple discharge or tenderness (no axilarry adenopathy).  Abdominal:     General: Bowel sounds are normal.     Palpations: Abdomen is soft.     Tenderness: There is no abdominal tenderness.  Musculoskeletal:        General: No swelling or tenderness.     Cervical back: Neck supple.  Lymphadenopathy:     Cervical: No cervical adenopathy.  Skin:    Findings: No erythema or rash.  Neurological:     Mental Status: She is alert and oriented to person, place, and time.  Psychiatric:        Mood and Affect: Mood normal.        Behavior: Behavior normal.      Outpatient Encounter Medications as of 10/13/2021  Medication Sig   atorvastatin (LIPITOR) 10 MG tablet TAKE 1 TABLET BY MOUTH  DAILY   CALCIUM PO Take 300 mg by mouth 4 (four) times daily.   levothyroxine (SYNTHROID) 75 MCG tablet TAKE 1 TABLET(75 MCG) BY MOUTH DAILY   Multiple Vitamin (MULTIVITAMIN PO) Take by  mouth daily.   pantoprazole (PROTONIX) 40 MG tablet TAKE 1 TABLET BY MOUTH  DAILY  venlafaxine XR (EFFEXOR-XR) 37.5 MG 24 hr capsule TAKE 1 CAPSULE BY MOUTH  DAILY WITH BREAKFAST   [DISCONTINUED] albuterol (VENTOLIN HFA) 108 (90 Base) MCG/ACT inhaler Inhale 2 puffs into the lungs every 6 (six) hours as needed for wheezing or shortness of breath.   [DISCONTINUED] losartan (COZAAR) 100 MG tablet TAKE 1 TABLET BY MOUTH  DAILY   [DISCONTINUED] predniSONE (DELTASONE) 10 MG tablet Take 6 tablets x 1 day and then decrease by 1/2 tablet per day until down to zero mg.   No facility-administered encounter medications on file as of 10/13/2021.     Lab Results  Component Value Date   WBC 7.5 04/09/2019   HGB 13.9 04/09/2019   HCT 41.8 04/09/2019   PLT 294 04/09/2019   GLUCOSE 89 12/19/2019   CHOL 169 12/19/2019   TRIG 117 12/19/2019   HDL 45 12/19/2019   LDLCALC 103 (H) 12/19/2019   ALT 13 12/19/2019   AST 18 12/19/2019   NA 142 12/19/2019   K 4.6 12/19/2019   CL 102 12/19/2019   CREATININE 0.81 12/19/2019   BUN 17 12/19/2019   CO2 27 12/19/2019   TSH 0.678 12/19/2019   HGBA1C 5.3 03/19/2015    US Abdomen Limited  Result Date: 10/07/2021 CLINICAL DATA:  Abdominal fullness Abdominal wall hernia for 3 months EXAM: ULTRASOUND ABDOMEN LIMITED COMPARISON:  None available FINDINGS: Targeted sonographic evaluation of the area of concern in the left periumbilical region demonstrates no discrete sonographic abnormality. IMPRESSION: No hernia or other discrete sonographic abnormality identified in the area of concern in the left periumbilical region. Electronically Signed   By: Miachel Roux M.D.   On: 10/07/2021 13:14   US Abdomen Complete  Result Date: 10/07/2021 CLINICAL DATA:  Abdominal fullness EXAM: ABDOMEN ULTRASOUND COMPLETE COMPARISON:  Abdominal ultrasound 01/22/2006 FINDINGS: Gallbladder: Surgically absent. Common bile duct: Diameter: 7 mm Liver: No focal lesion identified. Within normal  limits in parenchymal echogenicity. Portal vein is patent on color Doppler imaging with normal direction of blood flow towards the liver. IVC: No abnormality visualized. Pancreas: Visualized portion unremarkable. Spleen: Size and appearance within normal limits. Right Kidney: Length: 11 cm. Echogenicity within normal limits. No mass or hydronephrosis visualized. Left Kidney: Length: 10.7 cm. Echogenicity within normal limits. No mass or hydronephrosis visualized. Abdominal aorta: No aneurysm visualized. Other findings: None. IMPRESSION: Unremarkable examination. Electronically Signed   By: Ofilia Neas M.D.   On: 10/07/2021 13:05       Assessment & Plan:   Problem List Items Addressed This Visit     Abnormal mammogram    Saw Dr Bary Castilla - evaluation abnormal mammogram (02/22/21 - f/u views 03/09/21) - recommended observation.  F/u left breast diagnostic mammogram in 08/2021.  Left breast diagnostic mammogram - area of concern less prominent.  Recommended 6 month f/u.       Cough    Recent symptoms resolved after prednisone.  Since off, some symptoms starting to return.  Discussed treatment of allergy symptoms. Unclear of trigger.  Planning to travel.  Will follow and see if symptoms improve .  Call with update when returns.        GERD (gastroesophageal reflux disease)    Reports no acid reflux.  On protonix.       Health care maintenance    Physical today 10/13/21.  Just had breast exam - Dr Bary Castilla.  Colonoscopy 01/2016.  Recommended f/u in 5 years.  S/p hysterectomy.        Hypercholesterolemia    On  lipitor.  Low cholesterol diet and exercise.  Follow lipid panel and liver function tests.        Hypertension    Blood pressure elevated.  On losartan.  Have her continue to spot check her blood pressure.  Send in readings.  Schedule f/u soon to reassess.       Hypothyroid    On thyroid replacement.  Follow tsh.       Other Visit Diagnoses     Routine general medical  examination at a health care facility    -  Primary        Einar Pheasant, MD

## 2021-10-14 ENCOUNTER — Other Ambulatory Visit: Payer: Self-pay | Admitting: Internal Medicine

## 2021-10-16 ENCOUNTER — Encounter: Payer: Self-pay | Admitting: Internal Medicine

## 2021-10-16 NOTE — Assessment & Plan Note (Signed)
Saw Dr Byrnett - evaluation abnormal mammogram (02/22/21 - f/u views 03/09/21) - recommended observation.  F/u left breast diagnostic mammogram in 08/2021.  Left breast diagnostic mammogram - area of concern less prominent.  Recommended 6 month f/u.  

## 2021-10-16 NOTE — Assessment & Plan Note (Signed)
Recent symptoms resolved after prednisone.  Since off, some symptoms starting to return.  Discussed treatment of allergy symptoms. Unclear of trigger.  Planning to travel.  Will follow and see if symptoms improve .  Call with update when returns.

## 2021-10-16 NOTE — Assessment & Plan Note (Signed)
Reports no acid reflux.  On protonix.  

## 2021-10-16 NOTE — Assessment & Plan Note (Signed)
Blood pressure elevated.  On losartan.  Have her continue to spot check her blood pressure.  Send in readings.  Schedule f/u soon to reassess.

## 2021-10-16 NOTE — Assessment & Plan Note (Addendum)
Physical today 10/13/21.  Just had breast exam - Dr Bary Castilla.  Colonoscopy 01/2016.  Recommended f/u in 5 years.  S/p hysterectomy.

## 2021-10-16 NOTE — Assessment & Plan Note (Signed)
On lipitor.  Low cholesterol diet and exercise.  Follow lipid panel and liver function tests.   

## 2021-10-16 NOTE — Assessment & Plan Note (Signed)
On thyroid replacement.  Follow tsh.  

## 2021-10-24 ENCOUNTER — Encounter: Payer: Self-pay | Admitting: Internal Medicine

## 2021-10-24 DIAGNOSIS — H938X9 Other specified disorders of ear, unspecified ear: Secondary | ICD-10-CM

## 2021-10-24 NOTE — Telephone Encounter (Signed)
Pt returning call. Pt requesting callback.  °

## 2021-10-24 NOTE — Telephone Encounter (Signed)
Lm for pt to cb.

## 2021-10-25 ENCOUNTER — Ambulatory Visit
Admission: EM | Admit: 2021-10-25 | Discharge: 2021-10-25 | Disposition: A | Discharge status: Home / Self Care | Payer: Managed Care, Other (non HMO) | Attending: Family Medicine | Admitting: Family Medicine

## 2021-10-25 ENCOUNTER — Other Ambulatory Visit: Payer: Managed Care, Other (non HMO)

## 2021-10-25 ENCOUNTER — Encounter: Payer: Self-pay | Admitting: Emergency Medicine

## 2021-10-25 ENCOUNTER — Ambulatory Visit (INDEPENDENT_AMBULATORY_CARE_PROVIDER_SITE_OTHER): Payer: Managed Care, Other (non HMO)

## 2021-10-25 DIAGNOSIS — B349 Viral infection, unspecified: Secondary | ICD-10-CM | POA: Diagnosis not present

## 2021-10-25 DIAGNOSIS — R0602 Shortness of breath: Secondary | ICD-10-CM

## 2021-10-25 DIAGNOSIS — R600 Localized edema: Secondary | ICD-10-CM

## 2021-10-25 DIAGNOSIS — U071 COVID-19: Secondary | ICD-10-CM | POA: Diagnosis not present

## 2021-10-25 MED ORDER — POTASSIUM CHLORIDE ER 10 MEQ PO TBCR: 10.0000 meq | EXTENDED_RELEASE_TABLET | ORAL | 0 refills | Status: DC

## 2021-10-25 MED ORDER — FUROSEMIDE 20 MG PO TABS: 20.0000 mg | ORAL_TABLET | Freq: Every day | ORAL | 0 refills | Status: DC

## 2021-10-25 NOTE — Discharge Instructions (Addendum)
If any RED FLAG symptoms we discussed develop go immediately to the emergency department. Take medication as prescribed. Increase nasal spray to twice day.

## 2021-10-25 NOTE — ED Provider Notes (Signed)
Roderic Palau    CSN: [id number] Arrival date & time: 10/25/21  1313      History   Chief Complaint Chief Complaint  Patient presents with   Covid Positive   Shortness of Breath    HPI Kristine Hendricks is a 56 y.o. female.   HPI Patient with a history of asthma, cardiomegaly, hypertension presents today with shortness of breath, URI symptoms and a positive home COVID test. URI symptoms have been present for close to 7 days, however the shortness of breath is most prominent when patient is performing exertional activity such as climbing the stairs in her house.   At rest the shortness of breath not present. She has not had any wheezing.  She endorses a mild cough. She had not taken any OTC medication.  Her PCP had prescribed her an albuterol inhaler however she was able to pick that up today and has not started medication.  She denies any chest pain.  Of note patient's bilateral legs and feet are swollen.  She travels extensively via airplane for work.  Reports chronic lower extremity swelling due to flying.  She denies any history of blood clots and denies any current lower leg or calf pain.  She is prescribed blood pressure medication however is not currently on a diuretic therapy.  Past Medical History:  Diagnosis Date   Anemia    Asthma    Basal cell carcinoma 04/09/2019   left lateral calf   GERD (gastroesophageal reflux disease)    History of chicken pox    Hypercholesterolemia    Hypertension    Hypothyroidism    Seasonal allergies    Sleep apnea     Patient Active Problem List   Diagnosis Date Noted   Abdominal fullness 09/23/2021   Cough 07/14/2021   Abnormal mammogram 05/08/2021   COVID-19 virus infection 06/13/2020   Hypothyroid 07/12/2018   Palpitations 12/13/2017   PAC (premature atrial contraction) 12/13/2017   Rapid heart beat 11/25/2017   Back pain 05/24/2017   Ear fullness 12/09/2016   Elevated TSH 12/09/2016   History of colonic polyps  02/17/2016   Rash 07/03/2015   Health care maintenance 04/01/2015   Subacute cutaneous lupus erythematosus 05/25/2014   Headache 11/06/2012   Menopausal syndrome 06/09/2012   Symptomatic menopausal or female climacteric states 06/09/2012   BMI 50.0-59.9, adult ([place]) 02/19/2012   Morbid obesity ([place]) 02/19/2012   Sleep apnea 02/17/2012   GERD (gastroesophageal reflux disease) 02/17/2012   Hypertension 02/12/2012   Hypercholesterolemia 02/12/2012    Past Surgical History:  Procedure Laterality Date   arthroscopic surgery     Dr Sabra Heck   BREAST CYST ASPIRATION Right 2005   BREAST SURGERY  4 /05   biopsy   CHOLECYSTECTOMY  2007   COLONOSCOPY WITH PROPOFOL N/A 02/14/2016   Procedure: COLONOSCOPY WITH PROPOFOL;  Surgeon: Manya Silvas, MD;  Location: [facility] ENDOSCOPY;  Service: Endoscopy;  Laterality: N/A;   DILATION AND CURETTAGE OF UTERUS  8/04   LAPAROSCOPIC GASTRIC BANDING  01/29/09   MYOMECTOMY  9/04   VAGINAL HYSTERECTOMY  06/11/03    OB History   No obstetric history on file.      Home Medications    Prior to Admission medications   Medication Sig Start Date End Date Taking? Authorizing Provider  furosemide (LASIX) 20 MG tablet Take 1 tablet (20 mg total) by mouth daily for 5 days. 10/25/21 10/30/21 Yes Scot Jun, FNP  potassium chloride (KLOR-CON) 10 MEQ tablet Take  1 tablet (10 mEq total) by mouth every other day. 10/25/21  Yes Scot Jun, FNP  albuterol (VENTOLIN HFA) 108 (90 Base) MCG/ACT inhaler INHALE 2 PUFFS INTO THE LUNGS EVERY 6 HOURS AS NEEDED FOR WHEEZING OR SHORTNESS OF BREATH 10/14/21   Einar Pheasant, MD  atorvastatin (LIPITOR) 10 MG tablet TAKE 1 TABLET BY MOUTH  DAILY 12/10/20   Einar Pheasant, MD  CALCIUM PO Take 300 mg by mouth 4 (four) times daily.    [provider]  levothyroxine (SYNTHROID) 75 MCG tablet TAKE 1 TABLET(75 MCG) BY MOUTH DAILY 10/26/20   Einar Pheasant, MD  losartan (COZAAR) 100 MG tablet TAKE 1 TABLET BY MOUTH   DAILY 10/14/21   Einar Pheasant, MD  Multiple Vitamin (MULTIVITAMIN PO) Take by mouth daily.    [provider]  pantoprazole (PROTONIX) 40 MG tablet TAKE 1 TABLET BY MOUTH  DAILY 12/10/20   Einar Pheasant, MD  venlafaxine XR (EFFEXOR-XR) 37.5 MG 24 hr capsule TAKE 1 CAPSULE BY MOUTH  DAILY WITH BREAKFAST 03/07/21   Einar Pheasant, MD    Family History Family History  Problem Relation Age of Onset   Lung cancer Mother    Arthritis Mother    Stroke Mother    Hypertension Mother    Colon polyps Mother    Thyroid disease Sister        graves disease   Colon cancer Other        maternal grandparent   Hyperlipidemia Father    Stroke Maternal Aunt    Stroke Maternal Uncle    Cancer Maternal Grandmother        colon   Alcohol abuse Cousin    Breast cancer Neg Hx    Heart disease Neg Hx     Social History Social History   Tobacco Use   Smoking status: Never   Smokeless tobacco: Never  Substance Use Topics   Alcohol use: No    Alcohol/week: 0.0 standard drinks of alcohol    Comment: 1 drink/month   Drug use: No     Allergies   Sulfa antibiotics, Sulfasalazine, and Codeine  Review of Systems Review of Systems Pertinent negatives listed in HPI   Physical Exam Triage Vital Signs ED Triage Vitals  Enc Vitals Group     BP      Pulse      Resp      Temp      Temp src      SpO2      Weight      Height      Head Circumference      Peak Flow      Pain Score      Pain Loc      Pain Edu?      Excl. in [place]?    No data found.  Updated Vital Signs BP (!) 175/115   Pulse 72   Temp 98.2 F (36.8 C)   Resp 20   LMP 06/11/2003   SpO2 97%   Visual Acuity Right Eye Distance:   Left Eye Distance:   Bilateral Distance:    Right Eye Near:   Left Eye Near:    Bilateral Near:     Physical Exam Vitals reviewed.  Constitutional:      Appearance: Normal appearance. She is obese.  HENT:     Head: Normocephalic and atraumatic.     Right Ear: External  ear normal.     Left Ear: External ear normal.  Nose: Congestion present.  Eyes:     Extraocular Movements: Extraocular movements intact.     Conjunctiva/sclera: Conjunctivae normal.     Pupils: Pupils are equal, round, and reactive to light.  Cardiovascular:     Rate and Rhythm: Normal rate and regular rhythm.  Pulmonary:     Effort: Pulmonary effort is normal.     Breath sounds: Normal breath sounds. No wheezing or rhonchi.  Abdominal:     Palpations: Abdomen is soft.  Musculoskeletal:     Cervical back: Normal range of motion and neck supple.     Right lower leg: Edema present.     Left lower leg: Edema present.  Skin:    General: Skin is warm and dry.     Capillary Refill: Capillary refill takes less than 2 seconds.  Neurological:     General: No focal deficit present.     Mental Status: She is alert.  Psychiatric:        Mood and Affect: Mood normal.        Behavior: Behavior normal.        Thought Content: Thought content normal.        Judgment: Judgment normal.      UC Treatments / Results  Labs (all labs ordered are listed, but only abnormal results are displayed) Labs Reviewed - No data to display  EKG   Radiology DG Chest 2 View  Result Date: 10/25/2021 CLINICAL DATA:  Shortness of breath for 5 days.  COVID positive. EXAM: CHEST - 2 VIEW COMPARISON:  09/22/2021. FINDINGS: Trachea is midline. Heart is enlarged, stable. Lungs are clear. No pleural fluid. Likely prominent epicardial fat anteriorly on the lateral view. IMPRESSION: No acute findings. Electronically Signed   By: Lorin Picket M.D.   On: 10/25/2021 13:35    Procedures Procedures (including critical care time)  Medications Ordered in UC Medications - No data to display  Initial Impression / Assessment and Plan / UC Course  I have reviewed the triage vital signs and the nursing notes.  Pertinent labs & imaging results that were available during my care of the patient were reviewed by me  and considered in my medical decision making (see chart for details).    Patient COVID-positive per home COVID test x 2 days ago however symptoms have been present for nearly 7 days therefore discussed option of antiviral therapy patient is mildly symptomatic with upper respiratory symptoms including nasal congestion and drainage therefore opted against starting antivirals.  After further discussion patient is having shortness of breath with exertional activities suspect this is related to underlying cardiomegaly which patient's PCP is already scheduled for an echocardiogram to have a full cardiac workup completed.  Suspect that COVID is likely inducing some increased work of breathing with increased activity.  Patient currently has an albuterol inhaler and has not began to use advised to use at least twice daily now that she knows that she is COVID-positive to help open up her lungs to reduce the symptoms of shortness of breath.  Patient has a history of recurrent bilateral lower extremity swelling however on exam today she has lower extremity edema.  Reviewed patient's most recent labs which included a renal function study and revealed a GFR greater than 60.  However patient has not had any recent electrolyte studies completed.  I am placing her on a short 5-day course of furosemide to help with lower leg swelling and adding potassium 10 mEq every other day to reduce risk of  hypokalemia.  Patient will follow-up with primary care doctor within 1 week to assess for improvement of lower extremity edema and for BMP study.  Chest x-ray today unremarkable with the exception of known cardiomegaly.  Strict ER precautions given the patient if she develops any bilateral lower leg pain or any chest tightness or pressure.  Patient verbalized understanding and agreement with plan. Final Clinical Impressions(s) / UC Diagnoses   Final diagnoses:  Positive self-administered antigen test for COVID-19  Viral illness   Bilateral leg edema  SOB (shortness of breath) on exertion     Discharge Instructions      If any RED FLAG symptoms we discussed develop go immediately to the emergency department. Take medication as prescribed. Increase nasal spray to twice day.       ED Prescriptions     Medication Sig Dispense Auth. Provider   furosemide (LASIX) 20 MG tablet Take 1 tablet (20 mg total) by mouth daily for 5 days. 5 tablet Scot Jun, FNP   potassium chloride (KLOR-CON) 10 MEQ tablet Take 1 tablet (10 mEq total) by mouth every other day. 3 tablet Scot Jun, FNP      PDMP not reviewed this encounter.   Scot Jun, FNP 10/25/21 1500

## 2021-10-25 NOTE — ED Triage Notes (Signed)
Pt here under advisement of PCP for evaluation of SOB that has increased over the last 2 days following a positive Covid test 5 days ago.

## 2021-10-25 NOTE — Telephone Encounter (Signed)
If covid positive and some sob,  may need to be evaluated.  Please confirm if pt agreeable to appt.

## 2021-10-25 NOTE — Telephone Encounter (Signed)
S/w pt - pt states her sob is significantly increased and is agreeable to eval. Pt advised to go to UC. Pt gave verbal agreement, will use [facility] across the street. Will go today.

## 2021-10-25 NOTE — Telephone Encounter (Signed)
Lm for pt to cb.

## 2021-10-26 NOTE — Telephone Encounter (Signed)
Reviewed.  Agree with keeping [place] posted regarding her symptoms.  Does she have to go on this trip.  If so and if she is going to be doing a lot of driving, needs to make sure getting out and walking around frequently.

## 2021-10-27 ENCOUNTER — Other Ambulatory Visit: Payer: Self-pay | Admitting: Family Medicine

## 2021-10-27 ENCOUNTER — Other Ambulatory Visit: Payer: Managed Care, Other (non HMO)

## 2021-10-27 NOTE — Telephone Encounter (Signed)
Provider not at this practice . Requested Prescriptions  Refused Prescriptions Disp Refills  . furosemide (LASIX) 20 MG tablet [Pharmacy Med Name: FUROSEMIDE '20MG'$  TABLETS] 5 tablet 0    Sig: TAKE 1 TABLET(20 MG) BY MOUTH DAILY FOR 5 DAYS     Cardiovascular:  Diuretics - Loop Failed - 10/27/2021  3:38 AM      Failed - K in normal range and within 180 days    Potassium  Date Value Ref Range Status  12/19/2019 4.6 3.5 - 5.2 mmol/L Final         Failed - Ca in normal range and within 180 days    Calcium  Date Value Ref Range Status  12/19/2019 9.7 8.7 - 10.2 mg/dL Final         Failed - Na in normal range and within 180 days    Sodium  Date Value Ref Range Status  12/19/2019 142 134 - 144 mmol/L Final         Failed - Cr in normal range and within 180 days    Creatinine, Ser  Date Value Ref Range Status  12/19/2019 0.81 0.57 - 1.00 mg/dL Final         Failed - Cl in normal range and within 180 days    Chloride  Date Value Ref Range Status  12/19/2019 102 96 - 106 mmol/L Final         Failed - Mg Level in normal range and within 180 days    Magnesium  Date Value Ref Range Status  12/12/2017 2.1 1.6 - 2.3 mg/dL Final         Failed - Last BP in normal range    BP Readings from Last 1 Encounters:  10/25/21 (!) 175/115         Failed - Valid encounter within last 6 months    Recent Outpatient Visits   None     Future Appointments            In 1 month Einar Pheasant, MD [place], [place]

## 2021-10-28 ENCOUNTER — Other Ambulatory Visit: Payer: Self-pay | Admitting: Family Medicine

## 2021-10-28 NOTE — Telephone Encounter (Signed)
Requested Prescriptions  Pending Prescriptions Disp Refills  . potassium chloride (KLOR-CON) 10 MEQ tablet [Pharmacy Med Name: POTASSIUM CL 10MEQ ER TABLETS] 3 tablet 0    Sig: TAKE 1 TABLET BY MOUTH EVERY OTHER DAY     Endocrinology:  Minerals - Potassium Supplementation Failed - 10/28/2021  3:36 AM      Failed - K in normal range and within 360 days    Potassium  Date Value Ref Range Status  12/19/2019 4.6 3.5 - 5.2 mmol/L Final         Failed - Cr in normal range and within 360 days    Creatinine, Ser  Date Value Ref Range Status  12/19/2019 0.81 0.57 - 1.00 mg/dL Final         Failed - Valid encounter within last 12 months    Recent Outpatient Visits   None     Future Appointments            In 1 month Einar Pheasant, MD [place], [facility]

## 2021-10-28 NOTE — Telephone Encounter (Signed)
Please call her and let her know thanks for the update.  Glad she is feeling better.  Any change or worsening symptoms, needs to be evaluated. Let us know if needs anything.

## 2021-11-07 NOTE — Telephone Encounter (Signed)
Order placed for ENT referral.   

## 2021-11-08 ENCOUNTER — Other Ambulatory Visit: Payer: Self-pay | Admitting: Internal Medicine

## 2021-11-12 ENCOUNTER — Encounter: Payer: Self-pay | Admitting: Internal Medicine

## 2021-11-12 DIAGNOSIS — J329 Chronic sinusitis, unspecified: Secondary | ICD-10-CM | POA: Insufficient documentation

## 2021-11-12 DIAGNOSIS — H6980 Other specified disorders of Eustachian tube, unspecified ear: Secondary | ICD-10-CM | POA: Insufficient documentation

## 2021-11-15 LAB — CBC WITH DIFFERENTIAL/PLATELET
Basophils Absolute: 0 10*3/uL (ref 0.0–0.2)
Basos: 0 %
EOS (ABSOLUTE): 0 10*3/uL (ref 0.0–0.4)
Eos: 0 %
Hematocrit: 41.9 % (ref 34.0–46.6)
Hemoglobin: 13.4 g/dL (ref 11.1–15.9)
Immature Grans (Abs): 0.2 10*3/uL — ABNORMAL HIGH (ref 0.0–0.1)
Immature Granulocytes: 1 %
Lymphocytes Absolute: 1.3 10*3/uL (ref 0.7–3.1)
Lymphs: 8 %
MCH: 27.3 pg (ref 26.6–33.0)
MCHC: 32 g/dL (ref 31.5–35.7)
MCV: 86 fL (ref 79–97)
Monocytes Absolute: 0.5 10*3/uL (ref 0.1–0.9)
Monocytes: 3 %
Neutrophils Absolute: 13.2 10*3/uL — ABNORMAL HIGH (ref 1.4–7.0)
Neutrophils: 88 %
Platelets: 352 10*3/uL (ref 150–450)
RBC: 4.9 x10E6/uL (ref 3.77–5.28)
RDW: 13.7 % (ref 11.7–15.4)
WBC: 15.2 10*3/uL — ABNORMAL HIGH (ref 3.4–10.8)

## 2021-11-15 LAB — BASIC METABOLIC PANEL
BUN/Creatinine Ratio: 22 (ref 9–23)
BUN: 19 mg/dL (ref 6–24)
CO2: 24 mmol/L (ref 20–29)
Calcium: 9.9 mg/dL (ref 8.7–10.2)
Chloride: 103 mmol/L (ref 96–106)
Creatinine, Ser: 0.85 mg/dL (ref 0.57–1.00)
Glucose: 112 mg/dL — ABNORMAL HIGH (ref 70–99)
Potassium: 4.4 mmol/L (ref 3.5–5.2)
Sodium: 141 mmol/L (ref 134–144)
eGFR: 80 mL/min/{1.73_m2} (ref >59)

## 2021-11-15 LAB — HEPATIC FUNCTION PANEL
ALT: 14 IU/L (ref 0–32)
AST: 18 IU/L (ref 0–40)
Albumin: 4.5 g/dL (ref 3.8–4.9)
Alkaline Phosphatase: 92 IU/L (ref 44–121)
Bilirubin Total: 0.4 mg/dL (ref 0.0–1.2)
Bilirubin, Direct: 0.11 mg/dL (ref 0.00–0.40)
Total Protein: 6.8 g/dL (ref 6.0–8.5)

## 2021-11-15 LAB — LIPID PANEL
Chol/HDL Ratio: 3.2 ratio (ref 0.0–4.4)
Cholesterol, Total: 196 mg/dL (ref 100–199)
HDL: 62 mg/dL (ref >39)
LDL Chol Calc (NIH): 116 mg/dL — ABNORMAL HIGH (ref 0–99)
Triglycerides: 102 mg/dL (ref 0–149)
VLDL Cholesterol Cal: 18 mg/dL (ref 5–40)

## 2021-11-15 LAB — TSH: TSH: 0.751 u[IU]/mL (ref 0.450–4.500)

## 2021-11-16 ENCOUNTER — Telehealth: Payer: Self-pay

## 2021-11-16 ENCOUNTER — Other Ambulatory Visit: Payer: Self-pay | Admitting: Internal Medicine

## 2021-11-16 DIAGNOSIS — D72829 Elevated white blood cell count, unspecified: Secondary | ICD-10-CM

## 2021-11-16 NOTE — Progress Notes (Signed)
Order placed for f/u cbc.   

## 2021-11-16 NOTE — Telephone Encounter (Signed)
Pt called back and I relayed the message to her and she is taking prednisone. Pt also stated she will leave a mychart message for the provider.

## 2021-11-16 NOTE — Telephone Encounter (Signed)
Lvm for pt to return call in regards to lab results.  Per Dr.Scott: Notify - white blood cell count is elevated.  Prednisone can increased. Confirm if taking any prednisone currently. Did have recent infection.  Confirm doing ok.  Would like to recheck cbc in 7-10 days to confirm stable/normal.  Cholesterol levels ok.  Increased some from last check.  Will follow.  Hgb, thyroid test, kidney function tests and liver function tests are wnl.

## 2021-11-23 ENCOUNTER — Ambulatory Visit: Payer: Managed Care, Other (non HMO) | Attending: Internal Medicine

## 2021-11-23 DIAGNOSIS — I517 Cardiomegaly: Secondary | ICD-10-CM

## 2021-11-23 LAB — ECHOCARDIOGRAM COMPLETE
AR max vel: 3.19 cm2
AV Area VTI: 3.08 cm2
AV Area mean vel: 3.38 cm2
AV Mean grad: 5 mmHg
AV Peak grad: 11.3 mmHg
Ao pk vel: 1.68 m/s
Area-P 1/2: 3.03 cm2
S' Lateral: 3.5 cm

## 2021-11-23 MED ORDER — PERFLUTREN LIPID MICROSPHERE
1.0000 mL | INTRAVENOUS | Status: AC | PRN
  Administered 2021-11-23: 2 mL via INTRAVENOUS

## 2021-11-25 ENCOUNTER — Telehealth: Payer: Self-pay

## 2021-11-25 NOTE — Telephone Encounter (Signed)
Patient returned office phone call. Note was read to patient and she will send bp readings through her MyChart.

## 2021-11-25 NOTE — Telephone Encounter (Signed)
Lvm for pt to return call in regards to Echo results.  Per Dr.Scott: Please call and notify  - ECHO - revealed normal heart function.  There are some mil changes consistent with a history of elevated blood pressure.  No significant valve abnormality.  Have her spot check her pressure and send in readings over the next few weeks.

## 2021-12-22 ENCOUNTER — Ambulatory Visit: Payer: Managed Care, Other (non HMO) | Admitting: Internal Medicine

## 2022-01-10 ENCOUNTER — Ambulatory Visit: Payer: Managed Care, Other (non HMO) | Admitting: Internal Medicine

## 2022-01-23 ENCOUNTER — Other Ambulatory Visit: Payer: Self-pay | Admitting: General Surgery

## 2022-01-23 DIAGNOSIS — R928 Other abnormal and inconclusive findings on diagnostic imaging of breast: Secondary | ICD-10-CM

## 2022-02-02 ENCOUNTER — Other Ambulatory Visit: Payer: Self-pay | Admitting: Internal Medicine

## 2022-02-06 ENCOUNTER — Ambulatory Visit: Payer: Managed Care, Other (non HMO) | Admitting: Internal Medicine

## 2022-02-14 ENCOUNTER — Ambulatory Visit: Payer: Managed Care, Other (non HMO) | Admitting: Internal Medicine

## 2022-02-14 ENCOUNTER — Encounter: Payer: Self-pay | Admitting: Internal Medicine

## 2022-02-14 VITALS — BP 138/84 | HR 72 | Temp 98.4°F | Resp 15 | Ht 64.0 in | Wt 329.4 lb

## 2022-02-14 DIAGNOSIS — E7800 Pure hypercholesterolemia, unspecified: Secondary | ICD-10-CM

## 2022-02-14 DIAGNOSIS — K2100 Gastro-esophageal reflux disease with esophagitis, without bleeding: Secondary | ICD-10-CM | POA: Diagnosis not present

## 2022-02-14 DIAGNOSIS — R928 Other abnormal and inconclusive findings on diagnostic imaging of breast: Secondary | ICD-10-CM | POA: Diagnosis not present

## 2022-02-14 DIAGNOSIS — I10 Essential (primary) hypertension: Secondary | ICD-10-CM | POA: Diagnosis not present

## 2022-02-14 DIAGNOSIS — E039 Hypothyroidism, unspecified: Secondary | ICD-10-CM

## 2022-02-14 DIAGNOSIS — Z86010 Personal history of colonic polyps: Secondary | ICD-10-CM

## 2022-02-14 DIAGNOSIS — J329 Chronic sinusitis, unspecified: Secondary | ICD-10-CM

## 2022-02-14 MED ORDER — HYDROCHLOROTHIAZIDE 12.5 MG PO CAPS
12.5000 mg | ORAL_CAPSULE | Freq: Every day | ORAL | 2 refills | Status: DC
Start: 2022-02-14 — End: 2022-05-12

## 2022-02-14 NOTE — Assessment & Plan Note (Signed)
On lipitor.  Low cholesterol diet and exercise.  Follow lipid panel and liver function tests.   

## 2022-02-14 NOTE — Assessment & Plan Note (Signed)
Saw Dr Bary Castilla - evaluation abnormal mammogram (02/22/21 - f/u views 03/09/21) - recommended observation.  F/u left breast diagnostic mammogram in 08/2021.  Left breast diagnostic mammogram - area of concern less prominent.  Recommended 6 month f/u.

## 2022-02-14 NOTE — Assessment & Plan Note (Addendum)
On losartan.  Blood pressure as outlined.  Hold on changing.  Have her spot check pressures.  Send in readings.  Follow metabolic panel.

## 2022-02-14 NOTE — Progress Notes (Signed)
Patient ID: Kristine Hendricks, female   DOB: 1965/07/19, 56 y.o.   MRN: [id number]   Subjective:    Patient ID: Kristine Hendricks, female    DOB: 1965-08-10, 56 y.o.   MRN: [id number]   Patient here for a scheduled follow up.    HPI Here to follow up regarding her blood pressure.  Covid positive 10/2021. Has had persistent issues with congestion, sinus pressure.  Previous cough.  Cough is better.  Continued right side sinus pressure and drainage.  Has seen Dr Richardson Landry [facility].  Recently started on singulair.  Discussed taking regularly to see if helps.  Using nasal spray - directed by Dr Richardson Landry.  CT sinus negative.  No chest pain.  No increased cough or sob.  No abdominal pain.  States blood pressures averaging 130s/80-90.  Saw Dr Bary Castilla - f/u abnormal mammogram.    Past Medical History:  Diagnosis Date   Anemia    Asthma    Basal cell carcinoma 04/09/2019   left lateral calf   GERD (gastroesophageal reflux disease)    History of chicken pox    Hypercholesterolemia    Hypertension    Hypothyroidism    Seasonal allergies    Sleep apnea    Past Surgical History:  Procedure Laterality Date   arthroscopic surgery     Dr Sabra Heck   BREAST CYST ASPIRATION Right 2005   BREAST SURGERY  4 /05   biopsy   CHOLECYSTECTOMY  2007   COLONOSCOPY WITH PROPOFOL N/A 02/14/2016   Procedure: COLONOSCOPY WITH PROPOFOL;  Surgeon: Manya Silvas, MD;  Location: [place];  Service: Endoscopy;  Laterality: N/A;   DILATION AND CURETTAGE OF UTERUS  8/04   LAPAROSCOPIC GASTRIC BANDING  01/29/09   MYOMECTOMY  9/04   VAGINAL HYSTERECTOMY  06/11/03   Family History  Problem Relation Age of Onset   Lung cancer Mother    Arthritis Mother    Stroke Mother    Hypertension Mother    Colon polyps Mother    Thyroid disease Sister        graves disease   Colon cancer Other        maternal grandparent   Hyperlipidemia Father    Stroke Maternal Aunt    Stroke Maternal Uncle    Cancer Maternal  Grandmother        colon   Alcohol abuse Cousin    Breast cancer Neg Hx    Heart disease Neg Hx    Social History   Socioeconomic History   Marital status: Single    Spouse name: Not on file   Number of children: 0   Years of education: Not on file   Highest education level: Not on file  Occupational History    Employer: [employer]  Tobacco Use   Smoking status: Never   Smokeless tobacco: Never  Substance and Sexual Activity   Alcohol use: No    Alcohol/week: 0.0 standard drinks of alcohol    Comment: 1 drink/month   Drug use: No   Sexual activity: Not on file  Other Topics Concern   Not on file  Social History Narrative   Not on file   Social Determinants of Health   Financial Resource Strain: Not on file  Food Insecurity: Not on file  Transportation Needs: Not on file  Physical Activity: Not on file  Stress: Not on file  Social Connections: Not on file     Review of Systems  Constitutional:  Negative  for appetite change and unexpected weight change.  HENT:  Positive for congestion, postnasal drip and sinus pressure.   Respiratory:  Negative for chest tightness and shortness of breath.        Overall cough is better.   Cardiovascular:  Negative for chest pain and palpitations.  Gastrointestinal:  Negative for abdominal pain, diarrhea, nausea and vomiting.  Genitourinary:  Negative for difficulty urinating and dysuria.  Musculoskeletal:  Negative for joint swelling and myalgias.  Skin:  Negative for color change and rash.  Neurological:  Negative for dizziness and headaches.  Psychiatric/Behavioral:  Negative for agitation and dysphoric mood.        Objective:     BP 138/84   Pulse 72   Temp 98.4 F (36.9 C) (Temporal)   Resp 15   Ht '5\' 4"'$  (1.626 m)   Wt (!) 329 lb 6.4 oz (149.4 kg)   LMP 06/11/2003   SpO2 98%   BMI 56.54 kg/m  Wt Readings from Last 3 Encounters:  02/14/22 (!) 329 lb 6.4 oz (149.4 kg)  10/13/21 (!) 322 lb 9.6 oz (146.3 kg)   09/22/21 (!) 324 lb 12.8 oz (147.3 kg)    Physical Exam Vitals reviewed.  Constitutional:      General: She is not in acute distress.    Appearance: Normal appearance.  HENT:     Head: Normocephalic and atraumatic.     Right Ear: External ear normal.     Left Ear: External ear normal.     Mouth/Throat:     Pharynx: No oropharyngeal exudate or posterior oropharyngeal erythema.  Eyes:     General: No scleral icterus.       Right eye: No discharge.        Left eye: No discharge.     Conjunctiva/sclera: Conjunctivae normal.  Neck:     Thyroid: No thyromegaly.  Cardiovascular:     Rate and Rhythm: Normal rate and regular rhythm.  Pulmonary:     Effort: No respiratory distress.     Breath sounds: Normal breath sounds. No wheezing.  Abdominal:     General: Bowel sounds are normal.     Palpations: Abdomen is soft.     Tenderness: There is no abdominal tenderness.  Musculoskeletal:        General: No swelling or tenderness.     Cervical back: Neck supple. No tenderness.  Lymphadenopathy:     Cervical: No cervical adenopathy.  Skin:    Findings: No erythema or rash.  Neurological:     Mental Status: She is alert.  Psychiatric:        Mood and Affect: Mood normal.        Behavior: Behavior normal.      Outpatient Encounter Medications as of 02/14/2022  Medication Sig   albuterol (VENTOLIN HFA) 108 (90 Base) MCG/ACT inhaler INHALE 2 PUFFS INTO THE LUNGS EVERY 6 HOURS AS NEEDED FOR WHEEZING OR SHORTNESS OF BREATH   atorvastatin (LIPITOR) 10 MG tablet TAKE 1 TABLET BY MOUTH  DAILY   CALCIUM PO Take 300 mg by mouth 4 (four) times daily.   hydrochlorothiazide (MICROZIDE) 12.5 MG capsule Take 1 capsule (12.5 mg total) by mouth daily.   levothyroxine (SYNTHROID) 75 MCG tablet TAKE 1 TABLET(75 MCG) BY MOUTH DAILY   losartan (COZAAR) 100 MG tablet TAKE 1 TABLET BY MOUTH  DAILY   Multiple Vitamin (MULTIVITAMIN PO) Take by mouth daily.   pantoprazole (PROTONIX) 40 MG tablet TAKE 1  TABLET BY MOUTH  DAILY  RYALTRIS [id number] MCG/ACT SUSP Place 2 sprays into both nostrils 2 (two) times daily.   SINGULAIR 10 MG tablet    venlafaxine XR (EFFEXOR-XR) 37.5 MG 24 hr capsule TAKE 1 CAPSULE BY MOUTH  DAILY WITH BREAKFAST   [DISCONTINUED] furosemide (LASIX) 20 MG tablet Take 1 tablet (20 mg total) by mouth daily for 5 days.   [DISCONTINUED] potassium chloride (KLOR-CON) 10 MEQ tablet Take 1 tablet (10 mEq total) by mouth every other day.   No facility-administered encounter medications on file as of 02/14/2022.     Lab Results  Component Value Date   WBC 15.2 (H) 11/14/2021   HGB 13.4 11/14/2021   HCT 41.9 11/14/2021   PLT 352 11/14/2021   GLUCOSE 112 (H) 11/14/2021   CHOL 196 11/14/2021   TRIG 102 11/14/2021   HDL 62 11/14/2021   LDLCALC 116 (H) 11/14/2021   ALT 14 11/14/2021   AST 18 11/14/2021   NA 141 11/14/2021   K 4.4 11/14/2021   CL 103 11/14/2021   CREATININE 0.85 11/14/2021   BUN 19 11/14/2021   CO2 24 11/14/2021   TSH 0.751 11/14/2021   HGBA1C 5.3 03/19/2015    DG Chest 2 View  Result Date: 10/25/2021 CLINICAL DATA:  Shortness of breath for 5 days.  COVID positive. EXAM: CHEST - 2 VIEW COMPARISON:  09/22/2021. FINDINGS: Trachea is midline. Heart is enlarged, stable. Lungs are clear. No pleural fluid. Likely prominent epicardial fat anteriorly on the lateral view. IMPRESSION: No acute findings. Electronically Signed   By: Lorin Picket M.D.   On: 10/25/2021 13:35       Assessment & Plan:   Problem List Items Addressed This Visit     Abnormal mammogram    Saw Dr Bary Castilla - evaluation abnormal mammogram (02/22/21 - f/u views 03/09/21) - recommended observation.  F/u left breast diagnostic mammogram in 08/2021.  Left breast diagnostic mammogram - area of concern less prominent.  Recommended 6 month f/u.       Chronic sinusitis    Saw ENT 10/2021.  Prednisone/nasal endoscopy.  Has had sinus CT.  Nasal sprays.  Now on singulair.  Discussed taking regularly.   Continue f/u with ENT.  Has had multiple rounds of steroids.  Hold on steroids at this time.  Follow.        Relevant Medications   RYALTRIS 665-25 MCG/ACT SUSP   GERD (gastroesophageal reflux disease)    Reports no acid reflux.  On protonix.       History of colonic polyps    Colonoscopy 02/14/16 - two diminutive polyps in the rectum and internal hemorrhoids.  Pathology - hyperplastic rectal polyp.  Recommended f/u in 5 years.       Hypercholesterolemia    On lipitor.  Low cholesterol diet and exercise.  Follow lipid panel and liver function tests.        Relevant Medications   hydrochlorothiazide (MICROZIDE) 12.5 MG capsule   Hypertension - Primary    On losartan.  Blood pressure as outlined.  Hold on changing.  Have her spot check pressures.  Send in readings.  Follow metabolic panel.       Relevant Medications   hydrochlorothiazide (MICROZIDE) 12.5 MG capsule   Hypothyroid    On thyroid replacement.  Follow tsh.         Einar Pheasant, MD

## 2022-02-16 ENCOUNTER — Other Ambulatory Visit: Payer: Self-pay | Admitting: General Surgery

## 2022-02-16 DIAGNOSIS — R928 Other abnormal and inconclusive findings on diagnostic imaging of breast: Secondary | ICD-10-CM

## 2022-02-19 ENCOUNTER — Telehealth: Payer: Self-pay | Admitting: Internal Medicine

## 2022-02-19 ENCOUNTER — Encounter: Payer: Self-pay | Admitting: Internal Medicine

## 2022-02-19 NOTE — Assessment & Plan Note (Signed)
On thyroid replacement.  Follow tsh.  

## 2022-02-19 NOTE — Telephone Encounter (Signed)
Need to confirm with Kristopher Oppenheim when she is due next hepatitis b vaccine - based on which vaccine she was originally given will determine series (how many she gets).  Should be one month after. Please confirm.  See me with date and if questions.

## 2022-02-19 NOTE — Assessment & Plan Note (Signed)
Reports no acid reflux.  On protonix.

## 2022-02-19 NOTE — Assessment & Plan Note (Signed)
Saw ENT 10/2021.  Prednisone/nasal endoscopy.  Has had sinus CT.  Nasal sprays.  Now on singulair.  Discussed taking regularly.  Continue f/u with ENT.  Has had multiple rounds of steroids.  Hold on steroids at this time.  Follow.

## 2022-02-19 NOTE — Assessment & Plan Note (Signed)
Colonoscopy 02/14/16 - two diminutive polyps in the rectum and internal hemorrhoids.  Pathology - hyperplastic rectal polyp.  Recommended f/u in 5 years.

## 2022-03-14 NOTE — Telephone Encounter (Signed)
LM on pharmacy line LM on pt cell

## 2022-03-17 NOTE — Telephone Encounter (Signed)
L/M FOR PT. TO C/B.

## 2022-03-20 ENCOUNTER — Encounter: Payer: Self-pay | Admitting: Internal Medicine

## 2022-03-27 ENCOUNTER — Ambulatory Visit
Admission: RE | Admit: 2022-03-27 | Discharge: 2022-03-27 | Disposition: A | Discharge status: Home / Self Care | Payer: Managed Care, Other (non HMO) | Source: Ambulatory Visit | Attending: General Surgery | Admitting: General Surgery

## 2022-03-27 ENCOUNTER — Ambulatory Visit
Admission: RE | Admit: 2022-03-27 | Discharge: 2022-03-27 | Disposition: A | Discharge status: Home / Self Care | Payer: Managed Care, Other (non HMO) | Source: Ambulatory Visit | Attending: General Surgery

## 2022-03-27 DIAGNOSIS — R928 Other abnormal and inconclusive findings on diagnostic imaging of breast: Secondary | ICD-10-CM | POA: Insufficient documentation

## 2022-03-28 ENCOUNTER — Telehealth: Payer: Self-pay | Admitting: General Surgery

## 2022-03-28 NOTE — Telephone Encounter (Signed)
Contacted patient to notify about mammogram results.  Unable to contact patient.  I left voicemail.  Will try to call back later.

## 2022-04-10 ENCOUNTER — Ambulatory Visit: Payer: Managed Care, Other (non HMO) | Admitting: Internal Medicine

## 2022-04-10 ENCOUNTER — Encounter: Payer: Self-pay | Admitting: Internal Medicine

## 2022-04-10 VITALS — BP 118/72 | HR 61 | Temp 97.9°F | Resp 17 | Ht 64.0 in | Wt 330.0 lb

## 2022-04-10 DIAGNOSIS — E7800 Pure hypercholesterolemia, unspecified: Secondary | ICD-10-CM | POA: Diagnosis not present

## 2022-04-10 DIAGNOSIS — R928 Other abnormal and inconclusive findings on diagnostic imaging of breast: Secondary | ICD-10-CM

## 2022-04-10 DIAGNOSIS — E039 Hypothyroidism, unspecified: Secondary | ICD-10-CM | POA: Diagnosis not present

## 2022-04-10 DIAGNOSIS — I10 Essential (primary) hypertension: Secondary | ICD-10-CM | POA: Diagnosis not present

## 2022-04-10 DIAGNOSIS — K2100 Gastro-esophageal reflux disease with esophagitis, without bleeding: Secondary | ICD-10-CM

## 2022-04-10 DIAGNOSIS — D72829 Elevated white blood cell count, unspecified: Secondary | ICD-10-CM

## 2022-04-10 DIAGNOSIS — Z136 Encounter for screening for cardiovascular disorders: Secondary | ICD-10-CM

## 2022-04-10 DIAGNOSIS — J329 Chronic sinusitis, unspecified: Secondary | ICD-10-CM

## 2022-04-10 DIAGNOSIS — Z86010 Personal history of colonic polyps: Secondary | ICD-10-CM

## 2022-04-10 NOTE — Progress Notes (Signed)
Patient ID: Kristine Hendricks, female   DOB: 04-Aug-1965, 57 y.o.   MRN: [id number]   Subjective:    Patient ID: Kristine Hendricks, female    DOB: April 23, 1965, 57 y.o.   MRN: [id number]   Patient here for a scheduled follow up.    HPI Here to follow up regarding her blood pressure.  Covid positive 10/2021.  Previously had persistent issues with congestion, sinus pressure.  Previous cough.  Cough is better.  Had continued right side sinus pressure and drainage.  Saw Dr Richardson Landry - [place] [facility].  Started on singulair.  This has helped.  Off nasal sprays now.  Doing well with just singulair.  CT sinus negative.  No chest pain.  No increased cough or sob.  No abdominal pain.  States blood pressures are doing well < 120s/70s. Clarnce Flock Dr Bary Castilla - f/u abnormal mammogram. Just had f/u diagnostic mammogram 03/27/22 - Birads I.  Recommended f/u annual screening through our office.  Scheduled for colonoscopy 06/01/22.  Overall feels good. Interested in calcium score.    Past Medical History:  Diagnosis Date   Anemia    Asthma    Basal cell carcinoma 04/09/2019   left lateral calf   GERD (gastroesophageal reflux disease)    History of chicken pox    Hypercholesterolemia    Hypertension    Hypothyroidism    Seasonal allergies    Sleep apnea    Past Surgical History:  Procedure Laterality Date   arthroscopic surgery     Dr Sabra Heck   BREAST CYST ASPIRATION Right 2005   BREAST SURGERY  4 /05   biopsy   CHOLECYSTECTOMY  2007   COLONOSCOPY WITH PROPOFOL N/A 02/14/2016   Procedure: COLONOSCOPY WITH PROPOFOL;  Surgeon: Manya Silvas, MD;  Location: [place];  Service: Endoscopy;  Laterality: N/A;   DILATION AND CURETTAGE OF UTERUS  8/04   LAPAROSCOPIC GASTRIC BANDING  01/29/09   MYOMECTOMY  9/04   VAGINAL HYSTERECTOMY  06/11/03   Family History  Problem Relation Age of Onset   Lung cancer Mother    Arthritis Mother    Stroke Mother    Hypertension Mother    Colon polyps Mother    Thyroid  disease Sister        graves disease   Colon cancer Other        maternal grandparent   Hyperlipidemia Father    Stroke Maternal Aunt    Stroke Maternal Uncle    Cancer Maternal Grandmother        colon   Alcohol abuse Cousin    Breast cancer Neg Hx    Heart disease Neg Hx    Social History   Socioeconomic History   Marital status: Single    Spouse name: Not on file   Number of children: 0   Years of education: Not on file   Highest education level: Not on file  Occupational History    Employer: [employer]  Tobacco Use   Smoking status: Never   Smokeless tobacco: Never  Substance and Sexual Activity   Alcohol use: No    Alcohol/week: 0.0 standard drinks of alcohol    Comment: 1 drink/month   Drug use: No   Sexual activity: Not on file  Other Topics Concern   Not on file  Social History Narrative   Not on file   Social Determinants of Health   Financial Resource Strain: Not on file  Food Insecurity: Not on file  Transportation  Needs: Not on file  Physical Activity: Not on file  Stress: Not on file  Social Connections: Not on file     Review of Systems  Constitutional:  Negative for appetite change and unexpected weight change.  HENT:  Negative for congestion and sore throat.        Congestion and pressure better.   Respiratory:  Negative for chest tightness and shortness of breath.        Overall cough is better.   Cardiovascular:  Negative for chest pain, palpitations and leg swelling.  Gastrointestinal:  Negative for abdominal pain, diarrhea, nausea and vomiting.  Genitourinary:  Negative for difficulty urinating and dysuria.  Musculoskeletal:  Negative for joint swelling and myalgias.  Skin:  Negative for color change and rash.  Neurological:  Negative for dizziness and headaches.  Psychiatric/Behavioral:  Negative for agitation and dysphoric mood.        Objective:     BP 118/72   Pulse 61   Temp 97.9 F (36.6 C)   Resp 17   Ht '5\' 4"'$  (1.626 m)    Wt (!) 330 lb (149.7 kg)   LMP 06/11/2003   SpO2 98%   BMI 56.64 kg/m  Wt Readings from Last 3 Encounters:  04/10/22 (!) 330 lb (149.7 kg)  02/14/22 (!) 329 lb 6.4 oz (149.4 kg)  10/13/21 (!) 322 lb 9.6 oz (146.3 kg)    Physical Exam Vitals reviewed.  Constitutional:      General: She is not in acute distress.    Appearance: Normal appearance.  HENT:     Head: Normocephalic and atraumatic.     Right Ear: External ear normal.     Left Ear: External ear normal.     Mouth/Throat:     Pharynx: No oropharyngeal exudate or posterior oropharyngeal erythema.  Eyes:     General: No scleral icterus.       Right eye: No discharge.        Left eye: No discharge.     Conjunctiva/sclera: Conjunctivae normal.  Neck:     Thyroid: No thyromegaly.  Cardiovascular:     Rate and Rhythm: Normal rate and regular rhythm.  Pulmonary:     Effort: No respiratory distress.     Breath sounds: Normal breath sounds. No wheezing.  Abdominal:     General: Bowel sounds are normal.     Palpations: Abdomen is soft.     Tenderness: There is no abdominal tenderness.  Musculoskeletal:        General: No swelling or tenderness.     Cervical back: Neck supple. No tenderness.  Lymphadenopathy:     Cervical: No cervical adenopathy.  Skin:    Findings: No erythema or rash.  Neurological:     Mental Status: She is alert.  Psychiatric:        Mood and Affect: Mood normal.        Behavior: Behavior normal.      Outpatient Encounter Medications as of 04/10/2022  Medication Sig   albuterol (VENTOLIN HFA) 108 (90 Base) MCG/ACT inhaler INHALE 2 PUFFS INTO THE LUNGS EVERY 6 HOURS AS NEEDED FOR WHEEZING OR SHORTNESS OF BREATH   atorvastatin (LIPITOR) 10 MG tablet TAKE 1 TABLET BY MOUTH  DAILY   CALCIUM PO Take 300 mg by mouth 4 (four) times daily.   hydrochlorothiazide (MICROZIDE) 12.5 MG capsule Take 1 capsule (12.5 mg total) by mouth daily.   levothyroxine (SYNTHROID) 75 MCG tablet TAKE 1 TABLET(75 MCG)  BY MOUTH DAILY  losartan (COZAAR) 100 MG tablet TAKE 1 TABLET BY MOUTH  DAILY   Multiple Vitamin (MULTIVITAMIN PO) Take by mouth daily.   pantoprazole (PROTONIX) 40 MG tablet TAKE 1 TABLET BY MOUTH  DAILY   SINGULAIR 10 MG tablet    venlafaxine XR (EFFEXOR-XR) 37.5 MG 24 hr capsule TAKE 1 CAPSULE BY MOUTH  DAILY WITH BREAKFAST   [DISCONTINUED] RYALTRIS 665-25 MCG/ACT SUSP Place 2 sprays into both nostrils 2 (two) times daily.   No facility-administered encounter medications on file as of 04/10/2022.     Lab Results  Component Value Date   WBC 15.2 (H) 11/14/2021   HGB 13.4 11/14/2021   HCT 41.9 11/14/2021   PLT 352 11/14/2021   GLUCOSE 112 (H) 11/14/2021   CHOL 196 11/14/2021   TRIG 102 11/14/2021   HDL 62 11/14/2021   LDLCALC 116 (H) 11/14/2021   ALT 14 11/14/2021   AST 18 11/14/2021   NA 141 11/14/2021   K 4.4 11/14/2021   CL 103 11/14/2021   CREATININE 0.85 11/14/2021   BUN 19 11/14/2021   CO2 24 11/14/2021   TSH 0.751 11/14/2021   HGBA1C 5.3 03/19/2015    DG Chest 2 View  Result Date: 10/25/2021 CLINICAL DATA:  Shortness of breath for 5 days.  COVID positive. EXAM: CHEST - 2 VIEW COMPARISON:  09/22/2021. FINDINGS: Trachea is midline. Heart is enlarged, stable. Lungs are clear. No pleural fluid. Likely prominent epicardial fat anteriorly on the lateral view. IMPRESSION: No acute findings. Electronically Signed   By: Lorin Picket M.D.   On: 10/25/2021 13:35       Assessment & Plan:   Problem List Items Addressed This Visit     Leukocytosis   Relevant Orders   CBC with Differential/Platelet   Hypothyroid    On thyroid replacement.  Follow tsh.       Hypertension - Primary    On losartan.  Blood pressure as outlined.  Continue current medication regimen. Follow metabolic panel.       Relevant Orders   Basic metabolic panel   Hypercholesterolemia    On lipitor.  Low cholesterol diet and exercise.  Follow lipid panel and liver function tests.         Relevant Orders   Hepatic function panel   Lipid panel   History of colonic polyps    Colonoscopy 02/14/16 - two diminutive polyps in the rectum and internal hemorrhoids.  Pathology - hyperplastic rectal polyp.  Recommended f/u in 5 years. Schedule for 06/01/22.       GERD (gastroesophageal reflux disease)    Reports no acid reflux.  On protonix.       Encounter for screening for coronary artery disease    Obtain CT calcium score.       Relevant Orders   CT CARDIAC SCORING (SELF PAY ONLY)   Chronic sinusitis    Saw ENT 10/2021.  Prednisone/nasal endoscopy.  Has had sinus CT.  Off nasal sprays.  Now on singulair.  Doing better.  Follow.       Abnormal mammogram    Saw Dr Bary Castilla - evaluation abnormal mammogram (02/22/21 - f/u views 03/09/21) - recommended observation.  F/u left breast diagnostic mammogram in 08/2021.  Left breast diagnostic mammogram - area of concern less prominent.  Recommended 6 month f/u. Bilateral diagnostic mammogram 03/27/22 - Birads I. Recommended screening mammogram in one year.        Einar Pheasant, MD

## 2022-04-16 ENCOUNTER — Encounter: Payer: Self-pay | Admitting: Internal Medicine

## 2022-04-16 DIAGNOSIS — Z136 Encounter for screening for cardiovascular disorders: Secondary | ICD-10-CM | POA: Insufficient documentation

## 2022-04-16 NOTE — Assessment & Plan Note (Signed)
Saw Dr Bary Castilla - evaluation abnormal mammogram (02/22/21 - f/u views 03/09/21) - recommended observation.  F/u left breast diagnostic mammogram in 08/2021.  Left breast diagnostic mammogram - area of concern less prominent.  Recommended 6 month f/u. Bilateral diagnostic mammogram 03/27/22 - Birads I. Recommended screening mammogram in one year.

## 2022-04-16 NOTE — Assessment & Plan Note (Signed)
Saw ENT 10/2021.  Prednisone/nasal endoscopy.  Has had sinus CT.  Off nasal sprays.  Now on singulair.  Doing better.  Follow.

## 2022-04-16 NOTE — Assessment & Plan Note (Addendum)
Colonoscopy 02/14/16 - two diminutive polyps in the rectum and internal hemorrhoids.  Pathology - hyperplastic rectal polyp.  Recommended f/u in 5 years. Schedule for 06/01/22.

## 2022-04-16 NOTE — Assessment & Plan Note (Signed)
On thyroid replacement.  Follow tsh.  

## 2022-04-16 NOTE — Assessment & Plan Note (Signed)
Reports no acid reflux.  On protonix.

## 2022-04-16 NOTE — Assessment & Plan Note (Signed)
On losartan.  Blood pressure as outlined.  Continue current medication regimen. Follow metabolic panel.

## 2022-04-16 NOTE — Assessment & Plan Note (Signed)
On lipitor.  Low cholesterol diet and exercise.  Follow lipid panel and liver function tests.   

## 2022-04-16 NOTE — Assessment & Plan Note (Signed)
Obtain CT calcium score.

## 2022-04-17 ENCOUNTER — Telehealth: Payer: Self-pay | Admitting: Internal Medicine

## 2022-04-17 NOTE — Telephone Encounter (Signed)
Lft pt vm to call to [phone] press option 3 and then 2 to [email or web address]

## 2022-04-20 LAB — BASIC METABOLIC PANEL
BUN/Creatinine Ratio: 20 (ref 9–23)
BUN: 15 mg/dL (ref 6–24)
CO2: 27 mmol/L (ref 20–29)
Calcium: 9.2 mg/dL (ref 8.7–10.2)
Chloride: 102 mmol/L (ref 96–106)
Creatinine, Ser: 0.74 mg/dL (ref 0.57–1.00)
Glucose: 92 mg/dL (ref 70–99)
Potassium: 4.4 mmol/L (ref 3.5–5.2)
Sodium: 142 mmol/L (ref 134–144)
eGFR: 94 mL/min/{1.73_m2} (ref >59)

## 2022-04-20 LAB — LIPID PANEL
Chol/HDL Ratio: 3.4 ratio (ref 0.0–4.4)
Cholesterol, Total: 172 mg/dL (ref 100–199)
HDL: 50 mg/dL (ref >39)
LDL Chol Calc (NIH): 107 mg/dL — ABNORMAL HIGH (ref 0–99)
Triglycerides: 78 mg/dL (ref 0–149)
VLDL Cholesterol Cal: 15 mg/dL (ref 5–40)

## 2022-04-20 LAB — HEPATIC FUNCTION PANEL
ALT: 11 IU/L (ref 0–32)
AST: 14 IU/L (ref 0–40)
Albumin: 3.9 g/dL (ref 3.8–4.9)
Alkaline Phosphatase: 108 IU/L (ref 44–121)
Bilirubin Total: 0.3 mg/dL (ref 0.0–1.2)
Bilirubin, Direct: <0.1 mg/dL (ref 0.00–0.40)
Total Protein: 6.3 g/dL (ref 6.0–8.5)

## 2022-04-20 LAB — CBC WITH DIFFERENTIAL/PLATELET
Basophils Absolute: 0.1 10*3/uL (ref 0.0–0.2)
Basos: 1 %
EOS (ABSOLUTE): 0.3 10*3/uL (ref 0.0–0.4)
Eos: 3 %
Hematocrit: 39.9 % (ref 34.0–46.6)
Hemoglobin: 12.9 g/dL (ref 11.1–15.9)
Immature Grans (Abs): 0 10*3/uL (ref 0.0–0.1)
Immature Granulocytes: 0 %
Lymphocytes Absolute: 1.8 10*3/uL (ref 0.7–3.1)
Lymphs: 17 %
MCH: 27.2 pg (ref 26.6–33.0)
MCHC: 32.3 g/dL (ref 31.5–35.7)
MCV: 84 fL (ref 79–97)
Monocytes Absolute: 0.8 10*3/uL (ref 0.1–0.9)
Monocytes: 8 %
Neutrophils Absolute: 7.2 10*3/uL — ABNORMAL HIGH (ref 1.4–7.0)
Neutrophils: 71 %
Platelets: 324 10*3/uL (ref 150–450)
RBC: 4.74 x10E6/uL (ref 3.77–5.28)
RDW: 13.6 % (ref 11.7–15.4)
WBC: 10.1 10*3/uL (ref 3.4–10.8)

## 2022-04-21 ENCOUNTER — Ambulatory Visit
Admission: RE | Admit: 2022-04-21 | Discharge: 2022-04-21 | Disposition: A | Discharge status: Home / Self Care | Payer: Managed Care, Other (non HMO) | Source: Ambulatory Visit | Attending: Internal Medicine | Admitting: Internal Medicine

## 2022-04-21 DIAGNOSIS — Z136 Encounter for screening for cardiovascular disorders: Secondary | ICD-10-CM | POA: Insufficient documentation

## 2022-04-28 ENCOUNTER — Other Ambulatory Visit: Payer: Self-pay | Admitting: Internal Medicine

## 2022-05-12 ENCOUNTER — Other Ambulatory Visit: Payer: Self-pay | Admitting: Internal Medicine

## 2022-05-15 ENCOUNTER — Other Ambulatory Visit: Payer: Self-pay | Admitting: Family

## 2022-05-31 ENCOUNTER — Encounter: Payer: Self-pay | Admitting: Gastroenterology

## 2022-05-31 NOTE — H&P (Signed)
Pre-Procedure H&P   Patient ID: Kristine Hendricks is a 57 y.o. female.  Gastroenterology Provider: Annamaria Helling, DO  Referring Provider: Dawson Bills, NP PCP: Einar Pheasant, MD  Date: 06/01/2022  HPI Ms. Kristine Hendricks is a 57 y.o. female who presents today for Colonoscopy for screening; fhx colon polyps and colon cancer .  Patient with a family history of colon polyps and colon cancer.  She last underwent colonoscopy in November 2017 with internal hemorrhoids and hyperplastic polyps noted.  A tortuous colon, sigmoid diverticulosis and internal hemorrhoids were also noted.  She underwent EGD in 2016 with esophagitis noted.  Status post hysterectomy cholecystectomy and gastric lap band. Most recent hemoglobin 12.9 MCV 84 platelets 324,000 creatinine 0.74   Past Medical History:  Diagnosis Date   Anemia    Asthma    Basal cell carcinoma 04/09/2019   left lateral calf   GERD (gastroesophageal reflux disease)    History of chicken pox    Hypercholesterolemia    Hypertension    Hypothyroidism    Seasonal allergies    Sleep apnea     Past Surgical History:  Procedure Laterality Date   arthroscopic surgery     Dr Sabra Heck   BREAST CYST ASPIRATION Right 2005   BREAST SURGERY  4 /05   biopsy   CHOLECYSTECTOMY  2007   COLONOSCOPY WITH PROPOFOL N/A 02/14/2016   Procedure: COLONOSCOPY WITH PROPOFOL;  Surgeon: Manya Silvas, MD;  Location: [place];  Service: Endoscopy;  Laterality: N/A;   DILATION AND CURETTAGE OF UTERUS  8/04   LAPAROSCOPIC GASTRIC BANDING  01/29/09   MYOMECTOMY  9/04   VAGINAL HYSTERECTOMY  06/11/03    Family History Mother- colon polyps MGF and MGM with colon cancer No h/o GI disease or malignancy  Review of Systems  Constitutional:  Negative for activity change, appetite change, chills, diaphoresis, fatigue, fever and unexpected weight change.  HENT:  Negative for trouble swallowing and voice change.   Respiratory:  Negative  for shortness of breath and wheezing.   Cardiovascular:  Negative for chest pain, palpitations and leg swelling.  Gastrointestinal:  Negative for abdominal distention, abdominal pain, anal bleeding, blood in stool, constipation, diarrhea, nausea, rectal pain and vomiting.  Musculoskeletal:  Negative for arthralgias and myalgias.  Skin:  Negative for color change and pallor.  Neurological:  Negative for dizziness, syncope and weakness.  Psychiatric/Behavioral:  Negative for confusion.   All other systems reviewed and are negative.    Medications No current facility-administered medications on file prior to encounter.   Current Outpatient Medications on File Prior to Encounter  Medication Sig Dispense Refill   albuterol (VENTOLIN HFA) 108 (90 Base) MCG/ACT inhaler INHALE 2 PUFFS INTO THE LUNGS EVERY 6 HOURS AS NEEDED FOR WHEEZING OR SHORTNESS OF BREATH 8.5 g 1   atorvastatin (LIPITOR) 10 MG tablet TAKE 1 TABLET BY MOUTH  DAILY 90 tablet 3   CALCIUM PO Take 300 mg by mouth 4 (four) times daily.     levothyroxine (SYNTHROID) 75 MCG tablet TAKE 1 TABLET(75 MCG) BY MOUTH DAILY 90 tablet 2   losartan (COZAAR) 100 MG tablet TAKE 1 TABLET BY MOUTH  DAILY 90 tablet 3   Multiple Vitamin (MULTIVITAMIN PO) Take by mouth daily.     pantoprazole (PROTONIX) 40 MG tablet TAKE 1 TABLET BY MOUTH  DAILY 90 tablet 3   SINGULAIR 10 MG tablet  (Patient not taking: Reported on 06/01/2022)      Pertinent medications related to  GI and procedure were reviewed by me with the patient prior to the procedure   Current Facility-Administered Medications:    0.9 %  sodium chloride infusion, , Intravenous, Continuous, Annamaria Helling, DO, Last Rate: 20 mL/hr at 06/01/22 0709, New Bag at 06/01/22 0709      Allergies  Allergen Reactions   Sulfa Antibiotics Rash    All over the body.   Sulfasalazine Rash    All over the body.   Codeine Nausea And Vomiting   Allergies were reviewed by me prior to the  procedure  Objective   Body mass index is 56.99 kg/m. Vitals:   06/01/22 0657  BP: (!) 141/85  Pulse: 64  Resp: 18  Temp: (!) 96.3 F (35.7 C)  TempSrc: Temporal  SpO2: 97%  Weight: (!) 150.6 kg  Height: '5\' 4"'$  (1.626 m)     Physical Exam Vitals and nursing note reviewed.  Constitutional:      General: She is not in acute distress.    Appearance: Normal appearance. She is obese. She is not ill-appearing, toxic-appearing or diaphoretic.  HENT:     Head: Normocephalic and atraumatic.     Nose: Nose normal.     Mouth/Throat:     Mouth: Mucous membranes are moist.     Pharynx: Oropharynx is clear.  Eyes:     General: No scleral icterus.    Extraocular Movements: Extraocular movements intact.  Cardiovascular:     Rate and Rhythm: Normal rate and regular rhythm.     Heart sounds: Normal heart sounds. No murmur heard.    No friction rub. No gallop.  Pulmonary:     Effort: Pulmonary effort is normal. No respiratory distress.     Breath sounds: Normal breath sounds. No wheezing, rhonchi or rales.  Abdominal:     General: Bowel sounds are normal. There is no distension.     Palpations: Abdomen is soft.     Tenderness: There is no abdominal tenderness. There is no guarding or rebound.  Musculoskeletal:     Cervical back: Neck supple.     Right lower leg: No edema.     Left lower leg: No edema.  Skin:    General: Skin is warm and dry.     Coloration: Skin is not jaundiced or pale.  Neurological:     General: No focal deficit present.     Mental Status: She is alert and oriented to person, place, and time. Mental status is at baseline.  Psychiatric:        Mood and Affect: Mood normal.        Behavior: Behavior normal.        Thought Content: Thought content normal.        Judgment: Judgment normal.      Assessment:  Ms. Kristine Hendricks is a 57 y.o. female  who presents today for Colonoscopy for screening; fhx colon polyps and colon cancer .  Plan:   Colonoscopy with possible intervention today  Colonoscopy with possible biopsy, control of bleeding, polypectomy, and interventions as necessary has been discussed with the patient/patient representative. Informed consent was obtained from the patient/patient representative after explaining the indication, nature, and risks of the procedure including but not limited to death, bleeding, perforation, missed neoplasm/lesions, cardiorespiratory compromise, and reaction to medications. Opportunity for questions was given and appropriate answers were provided. Patient/patient representative has verbalized understanding is amenable to undergoing the procedure.   Annamaria Helling, DO  [facility]  Portions  of the record may have been created with voice recognition software. Occasional wrong-word or 'sound-a-like' substitutions may have occurred due to the inherent limitations of voice recognition software.  Read the chart carefully and recognize, using context, where substitutions may have occurred.

## 2022-06-01 ENCOUNTER — Ambulatory Visit: Payer: Managed Care, Other (non HMO) | Admitting: Anesthesiology

## 2022-06-01 ENCOUNTER — Ambulatory Visit
Admission: RE | Admit: 2022-06-01 | Discharge: 2022-06-01 | Disposition: A | Discharge status: Home / Self Care | Payer: Managed Care, Other (non HMO) | Attending: Gastroenterology | Admitting: Gastroenterology

## 2022-06-01 ENCOUNTER — Encounter
Admission: RE | Disposition: A | Discharge status: Home / Self Care | Payer: Self-pay | Source: Home / Self Care | Attending: Gastroenterology

## 2022-06-01 ENCOUNTER — Encounter: Payer: Self-pay | Admitting: Gastroenterology

## 2022-06-01 ENCOUNTER — Other Ambulatory Visit: Payer: Self-pay

## 2022-06-01 DIAGNOSIS — Z85828 Personal history of other malignant neoplasm of skin: Secondary | ICD-10-CM | POA: Insufficient documentation

## 2022-06-01 DIAGNOSIS — Z1211 Encounter for screening for malignant neoplasm of colon: Secondary | ICD-10-CM | POA: Diagnosis present

## 2022-06-01 DIAGNOSIS — J45909 Unspecified asthma, uncomplicated: Secondary | ICD-10-CM | POA: Insufficient documentation

## 2022-06-01 DIAGNOSIS — K5730 Diverticulosis of large intestine without perforation or abscess without bleeding: Secondary | ICD-10-CM | POA: Diagnosis not present

## 2022-06-01 DIAGNOSIS — G4730 Sleep apnea, unspecified: Secondary | ICD-10-CM | POA: Diagnosis not present

## 2022-06-01 DIAGNOSIS — I10 Essential (primary) hypertension: Secondary | ICD-10-CM | POA: Insufficient documentation

## 2022-06-01 DIAGNOSIS — K640 First degree hemorrhoids: Secondary | ICD-10-CM | POA: Diagnosis not present

## 2022-06-01 DIAGNOSIS — Z6841 Body Mass Index (BMI) 40.0 and over, adult: Secondary | ICD-10-CM | POA: Diagnosis not present

## 2022-06-01 DIAGNOSIS — E039 Hypothyroidism, unspecified: Secondary | ICD-10-CM | POA: Insufficient documentation

## 2022-06-01 DIAGNOSIS — Z9049 Acquired absence of other specified parts of digestive tract: Secondary | ICD-10-CM | POA: Insufficient documentation

## 2022-06-01 DIAGNOSIS — Z90710 Acquired absence of both cervix and uterus: Secondary | ICD-10-CM | POA: Insufficient documentation

## 2022-06-01 DIAGNOSIS — Q438 Other specified congenital malformations of intestine: Secondary | ICD-10-CM | POA: Insufficient documentation

## 2022-06-01 DIAGNOSIS — Z800 Family history of malignant neoplasm of digestive organs: Secondary | ICD-10-CM | POA: Insufficient documentation

## 2022-06-01 DIAGNOSIS — K2100 Gastro-esophageal reflux disease with esophagitis, without bleeding: Secondary | ICD-10-CM | POA: Insufficient documentation

## 2022-06-01 DIAGNOSIS — K635 Polyp of colon: Secondary | ICD-10-CM | POA: Insufficient documentation

## 2022-06-01 DIAGNOSIS — E7800 Pure hypercholesterolemia, unspecified: Secondary | ICD-10-CM | POA: Diagnosis not present

## 2022-06-01 DIAGNOSIS — Z83719 Family history of colon polyps, unspecified: Secondary | ICD-10-CM | POA: Insufficient documentation

## 2022-06-01 HISTORY — PX: COLONOSCOPY: SHX5424

## 2022-06-01 SURGERY — COLONOSCOPY
Anesthesia: General

## 2022-06-01 MED ORDER — PROPOFOL 500 MG/50ML IV EMUL
INTRAVENOUS | Status: DC | PRN
  Administered 2022-06-01: 100 ug/kg/min via INTRAVENOUS

## 2022-06-01 MED ORDER — PROPOFOL 1000 MG/100ML IV EMUL
INTRAVENOUS | Status: AC
  Filled 2022-06-01: qty 100

## 2022-06-01 MED ORDER — DEXMEDETOMIDINE HCL IN NACL 200 MCG/50ML IV SOLN
INTRAVENOUS | Status: DC | PRN
  Administered 2022-06-01: 8 ug via INTRAVENOUS
  Administered 2022-06-01: 12 ug via INTRAVENOUS

## 2022-06-01 MED ORDER — PROPOFOL 10 MG/ML IV BOLUS
INTRAVENOUS | Status: DC | PRN
  Administered 2022-06-01: 50 mg via INTRAVENOUS

## 2022-06-01 MED ORDER — SODIUM CHLORIDE 0.9 % IV SOLN: INTRAVENOUS | Status: DC

## 2022-06-01 MED ORDER — LIDOCAINE HCL (CARDIAC) PF 100 MG/5ML IV SOSY
PREFILLED_SYRINGE | INTRAVENOUS | Status: DC | PRN
  Administered 2022-06-01: 80 mg via INTRAVENOUS

## 2022-06-01 NOTE — Anesthesia Postprocedure Evaluation (Signed)
Anesthesia Post Note  Patient: Kristine Hendricks  Procedure(s) Performed: COLONOSCOPY  Patient location during evaluation: PACU Anesthesia Type: General Level of consciousness: awake and alert Pain management: pain level controlled Vital Signs Assessment: post-procedure vital signs reviewed and stable Respiratory status: spontaneous breathing, nonlabored ventilation and respiratory function stable Cardiovascular status: blood pressure returned to baseline and stable Postop Assessment: no apparent nausea or vomiting Anesthetic complications: no   No notable events documented.   Last Vitals:  Vitals:   06/01/22 0657 06/01/22 0801  BP: (!) 141/85 112/69  Pulse: 64   Resp: 18   Temp: (!) 35.7 C (!) 35.6 C  SpO2: 97% 97%    Last Pain:  Vitals:   06/01/22 0811  TempSrc:   PainSc: 0-No pain                 [place] Ouch

## 2022-06-01 NOTE — Op Note (Addendum)
[facility] Gastroenterology Patient Name: Kristine Hendricks Procedure Date: 06/01/2022 7:07 AM MRN: [id number] Account #: [id number] Date of Birth: 25-Jun-1965 Admit Type: Outpatient Age: 57 Room: [facility] ENDO ROOM 1 Gender: Female Note Status: Supervisor Override Instrument Name: Jasper Riling [id number] Procedure:             Colonoscopy Indications:           Family history of colon polyps Providers:             Annamaria Helling DO, DO Referring MD:          Einar Pheasant, MD (Referring MD) Medicines:             Monitored Anesthesia Care Complications:         No immediate complications. Estimated blood loss:                         Minimal. Procedure:             Pre-Anesthesia Assessment:                        - Prior to the procedure, a History and Physical was                         performed, and patient medications and allergies were                         reviewed. The patient is competent. The risks and                         benefits of the procedure and the sedation options and                         risks were discussed with the patient. All questions                         were answered and informed consent was obtained.                         Patient identification and proposed procedure were                         verified by the physician, the nurse, the anesthetist                         and the technician in the endoscopy suite. Mental                         Status Examination: alert and oriented. Airway                         Examination: normal oropharyngeal airway and neck                         mobility. Respiratory Examination: clear to                         auscultation. CV Examination: RRR, no murmurs, no S3  or S4. Prophylactic Antibiotics: The patient does not                         require prophylactic antibiotics. Prior                         Anticoagulants: The patient has taken no anticoagulant                          or antiplatelet agents. ASA Grade Assessment: III - A                         patient with severe systemic disease. After reviewing                         the risks and benefits, the patient was deemed in                         satisfactory condition to undergo the procedure. The                         anesthesia plan was to use monitored anesthesia care                         (MAC). Immediately prior to administration of                         medications, the patient was re-assessed for adequacy                         to receive sedatives. The heart rate, respiratory                         rate, oxygen saturations, blood pressure, adequacy of                         pulmonary ventilation, and response to care were                         monitored throughout the procedure. The physical                         status of the patient was re-assessed after the                         procedure.                        After obtaining informed consent, the colonoscope was                         passed under direct vision. Throughout the procedure,                         the patient's blood pressure, pulse, and oxygen                         saturations were monitored continuously. The  Colonoscope was introduced through the anus and                         advanced to the the cecum, identified by appendiceal                         orifice and ileocecal valve. The colonoscopy was                         performed without difficulty. The patient tolerated                         the procedure well. The quality of the bowel                         preparation was evaluated using the BBPS [facility] Bowel                         Preparation Scale) with scores of: Right Colon = 3,                         Transverse Colon = 3 and Left Colon = 3 (entire mucosa                         seen well with no residual staining, small fragments                          of stool or opaque liquid). The total BBPS score                         equals 9. The ileocecal valve, appendiceal orifice,                         and rectum were photographed. Findings:      The perianal and digital rectal examinations were normal. Pertinent       negatives include normal sphincter tone.      A 1 to 2 mm polyp was found in the cecum. The polyp was sessile. The       polyp was removed with a jumbo cold forceps. Resection and retrieval       were complete. Estimated blood loss was minimal.      Multiple small-mouthed diverticula were found in the left colon.       Estimated blood loss: none.      The colon (entire examined portion) was mildly tortuous. Estimated blood       loss: none.      Non-bleeding internal hemorrhoids were found during retroflexion. The       hemorrhoids were Grade I (internal hemorrhoids that do not prolapse).       Estimated blood loss: none.      Anal papilla(e) were hypertrophied. Estimated blood loss: none.      thickened, edematous folds in sigmoid colon around diverticula      The exam was otherwise without abnormality on direct and retroflexion       views. Impression:            - One 1 to 2 mm polyp in the cecum, removed with a  jumbo cold forceps. Resected and retrieved.                        - Diverticulosis in the left colon.                        - Tortuous colon.                        - Non-bleeding internal hemorrhoids.                        - Anal papilla(e) were hypertrophied.                        - The examination was otherwise normal on direct and                         retroflexion views. Recommendation:        - Patient has a contact number available for                         emergencies. The signs and symptoms of potential                         delayed complications were discussed with the patient.                         Return to normal activities tomorrow. Written                          discharge instructions were provided to the patient.                        - Discharge patient to home.                        - Resume previous diet.                        - Continue present medications.                        - Await pathology results.                        - Repeat colonoscopy for surveillance based on                         pathology results.                        - Return to referring physician as previously                         scheduled.                        - The findings and recommendations were discussed with                         the patient. Procedure Code(s):     --- Professional ---  42706, Colonoscopy, flexible; with biopsy, single or                         multiple Diagnosis Code(s):     --- Professional ---                        Z80.0, Family history of malignant neoplasm of                         digestive organs                        D12.0, Benign neoplasm of cecum                        K64.0, First degree hemorrhoids                        K62.89, Other specified diseases of anus and rectum                        K57.30, Diverticulosis of large intestine without                         perforation or abscess without bleeding                        Q43.8, Other specified congenital malformations of                         intestine CPT copyright 2022 American Medical Association. All rights reserved. The codes documented in this report are preliminary and upon coder review may  be revised to meet current compliance requirements. Attending Participation:      I personally performed the entire procedure. Elfredia Nevins, DO Jaynie Collins DO, DO 06/01/2022 8:03:23 AM This report has been signed electronically. Number of Addenda: 0 Note Initiated On: 06/01/2022 7:07 AM Scope Withdrawal Time: 0 hours 10 minutes 15 seconds  Total Procedure Duration: 0 hours 16 minutes 38 seconds  Estimated Blood Loss:   Estimated blood loss was minimal.      [facility]

## 2022-06-01 NOTE — Interval H&P Note (Signed)
History and Physical Interval Note: Preprocedure H&P from 06/01/22  was reviewed and there was no interval change after seeing and examining the patient.  Written consent was obtained from the patient after discussion of risks, benefits, and alternatives. Patient has consented to proceed with Colonoscopy with possible intervention   06/01/2022 7:31 AM  Jerrye Beavers  has presented today for surgery, with the diagnosis of FH: colon polyps (Z83.719).  The various methods of treatment have been discussed with the patient and family. After consideration of risks, benefits and other options for treatment, the patient has consented to  Procedure(s): COLONOSCOPY (N/A) as a surgical intervention.  The patient's history has been reviewed, patient examined, no change in status, stable for surgery.  I have reviewed the patient's chart and labs.  Questions were answered to the patient's satisfaction.     Annamaria Helling

## 2022-06-01 NOTE — Transfer of Care (Signed)
Immediate Anesthesia Transfer of Care Note  Patient: Kristine Hendricks  Procedure(s) Performed: COLONOSCOPY  Patient Location: PACU  Anesthesia Type:General  Level of Consciousness: awake  Airway & Oxygen Therapy: Patient Spontanous Breathing  Post-op Assessment: Report given to RN and Post -op Vital signs reviewed and stable  Post vital signs: Reviewed and stable  Last Vitals:  Vitals Value Taken Time  BP 112/69 06/01/22 0801  Temp 35.6 C 06/01/22 0801  Pulse 62 06/01/22 0801  Resp 12 06/01/22 0801  SpO2 97 % 06/01/22 0801  Vitals shown include unvalidated device data.  Last Pain:  Vitals:   06/01/22 0801  TempSrc: Temporal  PainSc: 0-No pain         Complications: No notable events documented.

## 2022-06-01 NOTE — Anesthesia Preprocedure Evaluation (Signed)
Anesthesia Evaluation  Patient identified by MRN, date of birth, ID band Patient awake    Reviewed: Allergy & Precautions, H&P , NPO status , Patient's Chart, lab work & pertinent test results  Airway Mallampati: IV  TM Distance: >3 FB Neck ROM: full    Dental no notable dental hx.    Pulmonary shortness of breath, sleep apnea    Pulmonary exam normal        Cardiovascular Exercise Tolerance: Poor hypertension, + Cardiac Defibrillator  + Systolic murmurs ECHo [id number]:  1. Left ventricular ejection fraction, by estimation, is 55 to 60%. The  left ventricle has normal function. The left ventricle has no regional  wall motion abnormalities. There is mild left ventricular hypertrophy.  Left ventricular diastolic parameters  are consistent with Grade II diastolic dysfunction (pseudonormalization).   2. Right ventricular systolic function is normal. The right ventricular  size is normal.   3. Left atrial size was mildly dilated.   4. The mitral valve is normal in structure. No evidence of mitral valve  regurgitation.   5. The aortic valve was not well visualized. Aortic valve regurgitation  is not visualized.   6. The inferior vena cava is normal in size with greater than 50%  respiratory variability, suggesting right atrial pressure of 3 mmHg.     Neuro/Psych negative neurological ROS  negative psych ROS   GI/Hepatic Neg liver ROS,GERD  Controlled and Medicated,,  Endo/Other  Hypothyroidism  Morbid obesity  Renal/GU negative Renal ROS  negative genitourinary   Musculoskeletal   Abdominal  (+) + obese  Peds  Hematology negative hematology ROS (+)   Anesthesia Other Findings Past Medical History: No date: Anemia No date: Asthma 04/09/2019: Basal cell carcinoma     Comment:  left lateral calf No date: GERD (gastroesophageal reflux disease) No date: History of chicken pox No date: Hypercholesterolemia No date:  Hypertension No date: Hypothyroidism No date: Seasonal allergies No date: Sleep apnea  Past Surgical History: No date: arthroscopic surgery     Comment:  Dr Sabra Heck 2005: [place]; Right 4 /05: BREAST SURGERY     Comment:  biopsy 2007: CHOLECYSTECTOMY 02/14/2016: COLONOSCOPY WITH PROPOFOL; N/A     Comment:  Procedure: COLONOSCOPY WITH PROPOFOL;  Surgeon: Manya Silvas, MD;  Location: [place];  Service:               Endoscopy;  Laterality: N/A; 8/04: DILATION AND CURETTAGE OF UTERUS 01/29/09: LAPAROSCOPIC GASTRIC BANDING 9/04: MYOMECTOMY 06/11/03: VAGINAL HYSTERECTOMY  BMI    Body Mass Index: 56.99 kg/m      Reproductive/Obstetrics negative OB ROS                             Anesthesia Physical Anesthesia Plan  ASA: 3  Anesthesia Plan: General   Post-op Pain Management: Minimal or no pain anticipated   Induction: Intravenous  PONV Risk Score and Plan: Propofol infusion and TIVA  Airway Management Planned: Natural Airway and Mask  Additional Equipment:   Intra-op Plan:   Post-operative Plan:   Informed Consent: I have reviewed the patients History and Physical, chart, labs and discussed the procedure including the risks, benefits and alternatives for the proposed anesthesia with the patient or authorized representative who has indicated his/her understanding and acceptance.     Dental Advisory Given  Plan Discussed with: CRNA and Surgeon  Anesthesia  Plan Comments:         Anesthesia Quick Evaluation

## 2022-06-02 ENCOUNTER — Encounter: Payer: Self-pay | Admitting: Gastroenterology

## 2022-06-02 LAB — SURGICAL PATHOLOGY

## 2022-08-03 ENCOUNTER — Encounter: Payer: Self-pay | Admitting: Dermatology

## 2022-08-03 ENCOUNTER — Ambulatory Visit: Payer: Managed Care, Other (non HMO) | Admitting: Dermatology

## 2022-08-03 VITALS — BP 127/82 | HR 61

## 2022-08-03 DIAGNOSIS — L988 Other specified disorders of the skin and subcutaneous tissue: Secondary | ICD-10-CM

## 2022-08-03 DIAGNOSIS — L814 Other melanin hyperpigmentation: Secondary | ICD-10-CM | POA: Diagnosis not present

## 2022-08-03 DIAGNOSIS — D2372 Other benign neoplasm of skin of left lower limb, including hip: Secondary | ICD-10-CM

## 2022-08-03 DIAGNOSIS — Z1283 Encounter for screening for malignant neoplasm of skin: Secondary | ICD-10-CM | POA: Diagnosis not present

## 2022-08-03 DIAGNOSIS — L578 Other skin changes due to chronic exposure to nonionizing radiation: Secondary | ICD-10-CM

## 2022-08-03 DIAGNOSIS — L821 Other seborrheic keratosis: Secondary | ICD-10-CM

## 2022-08-03 DIAGNOSIS — W908XXA Exposure to other nonionizing radiation, initial encounter: Secondary | ICD-10-CM

## 2022-08-03 DIAGNOSIS — D1801 Hemangioma of skin and subcutaneous tissue: Secondary | ICD-10-CM | POA: Diagnosis not present

## 2022-08-03 DIAGNOSIS — X32XXXA Exposure to sunlight, initial encounter: Secondary | ICD-10-CM

## 2022-08-03 DIAGNOSIS — Z85828 Personal history of other malignant neoplasm of skin: Secondary | ICD-10-CM

## 2022-08-03 NOTE — Progress Notes (Signed)
   Follow-Up Visit   Subjective  Kristine Hendricks is a 57 y.o. female who presents for the following: Skin Cancer Screening and Full Body Skin Exam Bottom lip looks bruised  Spot at nose  At spot at left upper arm  The patient presents for Total-Body Skin Exam (TBSE) for skin cancer screening and mole check. The patient has spots, moles and lesions to be evaluated, some may be new or changing and the patient has concerns that these could be cancer.  The following portions of the chart were reviewed this encounter and updated as appropriate: medications, allergies, medical history  Review of Systems:  No other skin or systemic complaints except as noted in HPI or Assessment and Plan.  Objective  Well appearing patient in no apparent distress; mood and affect are within normal limits.  A full examination was performed including scalp, head, eyes, ears, nose, lips, neck, chest, axillae, abdomen, back, buttocks, bilateral upper extremities, bilateral lower extremities, hands, feet, fingers, toes, fingernails, and toenails. All findings within normal limits unless otherwise noted below.   Relevant physical exam findings are noted in the Assessment and Plan.    Assessment & Plan   LENTIGINES, SEBORRHEIC KERATOSES, HEMANGIOMAS - Benign normal skin lesions - Benign-appearing - Call for any changes  MELANOCYTIC NEVI - Tan-brown and/or pink-flesh-colored symmetric macules and papules - Benign appearing on exam today - Observation - Call clinic for new or changing moles - Recommend daily use of broad spectrum spf 30+ sunscreen to sun-exposed areas.   [place] Exam: red or purple papule at vermilion lip  Treatment Plan: Benign-appearing. Observe   DERMATOFIBROMA Right ankle Exam: Firm pink/brown papulenodule with dimple sign. Treatment Plan: A dermatofibroma is a benign growth possibly related to trauma, such as an insect bite, cut from shaving, or inflamed acne-type bump.   Treatment options to remove include shave or excision with resulting scar and risk of recurrence.  Since benign-appearing and not bothersome, will observe for now.    ACTINIC DAMAGE - Chronic condition, secondary to cumulative UV/sun exposure - diffuse scaly erythematous macules with underlying dyspigmentation - Recommend daily broad spectrum sunscreen SPF 30+ to sun-exposed areas, reapply every 2 hours as needed.  - Staying in the shade or wearing long sleeves, sun glasses (UVA+UVB protection) and wide brim hats (4-inch brim around the entire circumference of the hat) are also recommended for sun protection.  - Call for new or changing lesions.  HISTORY OF BASAL CELL CARCINOMA OF THE SKIN Left lateral calf 2021 - No evidence of recurrence today - Recommend regular full body skin exams - Recommend daily broad spectrum sunscreen SPF 30+ to sun-exposed areas, reapply every 2 hours as needed.  - Call if any new or changing lesions are noted between office visits   SKIN CANCER SCREENING PERFORMED TODAY.    Return in about 1 year (around 08/03/2023) for TBSE.  I, Asher Muir, CMA, am acting as scribe for Darden Dates, MD.   Documentation: I have reviewed the above documentation for accuracy and completeness, and I agree with the above.  Darden Dates, MD

## 2022-08-03 NOTE — Progress Notes (Deleted)
   Follow-Up Visit   Subjective  Kristine Hendricks is a 57 y.o. female who presents for the following: Skin Cancer Screening and Full Body Skin Exam  The patient presents for Total-Body Skin Exam (TBSE) for skin cancer screening and mole check. The patient has spots, moles and lesions to be evaluated, some may be new or changing and the patient has concerns that these could be cancer.    The following portions of the chart were reviewed this encounter and updated as appropriate: medications, allergies, medical history  Review of Systems:  No other skin or systemic complaints except as noted in HPI or Assessment and Plan.  Objective  Well appearing patient in no apparent distress; mood and affect are within normal limits.  A full examination was performed including scalp, head, eyes, ears, nose, lips, neck, chest, axillae, abdomen, back, buttocks, bilateral upper extremities, bilateral lower extremities, hands, feet, fingers, toes, fingernails, and toenails. All findings within normal limits unless otherwise noted below.   Relevant physical exam findings are noted in the Assessment and Plan.    Assessment & Plan   LENTIGINES, SEBORRHEIC KERATOSES, HEMANGIOMAS - Benign normal skin lesions - Benign-appearing - Call for any changes  MELANOCYTIC NEVI - Tan-brown and/or pink-flesh-colored symmetric macules and papules - Benign appearing on exam today - Observation - Call clinic for new or changing moles - Recommend daily use of broad spectrum spf 30+ sunscreen to sun-exposed areas.   ACTINIC DAMAGE - Chronic condition, secondary to cumulative UV/sun exposure - diffuse scaly erythematous macules with underlying dyspigmentation - Recommend daily broad spectrum sunscreen SPF 30+ to sun-exposed areas, reapply every 2 hours as needed.  - Staying in the shade or wearing long sleeves, sun glasses (UVA+UVB protection) and wide brim hats (4-inch brim around the entire circumference of the  hat) are also recommended for sun protection.  - Call for new or changing lesions.  SKIN CANCER SCREENING PERFORMED TODAY.     No follow-ups on file.  I, Lawson Radar, CMA, am acting as scribe for Darden Dates, MD.   Documentation: I have reviewed the above documentation for accuracy and completeness, and I agree with the above.  Darden Dates, MD

## 2022-08-03 NOTE — Patient Instructions (Addendum)
Recommend taking Heliocare sun protection supplement daily in sunny weather for additional sun protection. For maximum protection on the sunniest days, you can take up to 2 capsules of regular Heliocare OR take 1 capsule of Heliocare Ultra. For prolonged exposure (such as a full day in the sun), you can repeat your dose of the supplement 4 hours after your first dose. Heliocare can be purchased at [facility], at some [facility] or at www.heliocare.com.      Melanoma ABCDEs  Melanoma is the most dangerous type of skin cancer, and is the leading cause of death from skin disease.  You are more likely to develop melanoma if you: Have light-colored skin, light-colored eyes, or red or blond hair Spend a lot of time in the sun Tan regularly, either outdoors or in a tanning bed Have had blistering sunburns, especially during childhood Have a close family member who has had a melanoma Have atypical moles or large birthmarks  Early detection of melanoma is key since treatment is typically straightforward and cure rates are extremely high if we catch it early.   The first sign of melanoma is often a change in a mole or a new dark spot.  The ABCDE system is a way of remembering the signs of melanoma.  A for asymmetry:  The two halves do not match. B for border:  The edges of the growth are irregular. C for color:  A mixture of colors are present instead of an even brown color. D for diameter:  Melanomas are usually (but not always) greater than 6mm - the size of a pencil eraser. E for evolution:  The spot keeps changing in size, shape, and color.  Please check your skin once per month between visits. You can use a small mirror in front and a large mirror behind you to keep an eye on the back side or your body.   If you see any new or changing lesions before your next follow-up, please call to schedule a visit.  Please continue daily skin protection including broad spectrum sunscreen SPF 30+  to sun-exposed areas, reapplying every 2 hours as needed when you're outdoors.   Staying in the shade or wearing long sleeves, sun glasses (UVA+UVB protection) and wide brim hats (4-inch brim around the entire circumference of the hat) are also recommended for sun protection.    Due to recent changes in healthcare laws, you may see results of your pathology and/or laboratory studies on MyChart before the doctors have had a chance to review them. We understand that in some cases there may be results that are confusing or concerning to you. Please understand that not all results are received at the same time and often the doctors may need to interpret multiple results in order to provide you with the best plan of care or course of treatment. Therefore, we ask that you please give us 2 business days to thoroughly review all your results before contacting the office for clarification. Should we see a critical lab result, you will be contacted sooner.   If You Need Anything After Your Visit  If you have any questions or concerns for your doctor, please call our main line at [phone] and press option 4 to reach your doctor's medical assistant. If no one answers, please leave a voicemail as directed and we will return your call as soon as possible. Messages left after 4 pm will be answered the following business day.   You may also send us   a message via MyChart. We typically respond to MyChart messages within 1-2 business days.  For prescription refills, please ask your pharmacy to contact our office. Our fax number is [phone].  If you have an urgent issue when the clinic is closed that cannot wait until the next business day, you can page your doctor at the number below.    Please note that while we do our best to be available for urgent issues outside of office hours, we are not available 24/7.   If you have an urgent issue and are unable to reach us, you may choose to seek medical care at  your doctor's office, retail clinic, urgent care center, or emergency room.  If you have a medical emergency, please immediately call 911 or go to the emergency department.  Pager Numbers  - Dr. Kowalski: [phone]  - Dr. Moye: [phone]  - Dr. Stewart: [phone]  In the event of inclement weather, please call our main line at [phone] for an update on the status of any delays or closures.  Dermatology Medication Tips: Please keep the boxes that topical medications come in in order to help keep track of the instructions about where and how to use these. Pharmacies typically print the medication instructions only on the boxes and not directly on the medication tubes.   If your medication is too expensive, please contact our office at [phone] option 4 or send us a message through MyChart.   We are unable to tell what your co-pay for medications will be in advance as this is different depending on your insurance coverage. However, we may be able to find a substitute medication at lower cost or fill out paperwork to get insurance to cover a needed medication.   If a prior authorization is required to get your medication covered by your insurance company, please allow us 1-2 business days to complete this process.  Drug prices often vary depending on where the prescription is filled and some pharmacies may offer cheaper prices.  The website www.goodrx.com contains coupons for medications through different pharmacies. The prices here do not account for what the cost may be with help from insurance (it may be cheaper with your insurance), but the website can give you the price if you did not use any insurance.  - You can print the associated coupon and take it with your prescription to the pharmacy.  - You may also stop by our office during regular business hours and pick up a GoodRx coupon card.  - If you need your prescription sent electronically to a different pharmacy,  notify our office through [facility] MyChart or by phone at [phone] option 4.     Si Usted Necesita Algo Despus de Su Visita  Tambin puede enviarnos un mensaje a travs de MyChart. Por lo general respondemos a los mensajes de MyChart en el transcurso de 1 a 2 das hbiles.  Para renovar recetas, por favor pida a su farmacia que se ponga en contacto con nuestra oficina. Nuestro nmero de fax es el [phone].  Si tiene un asunto urgente cuando la clnica est cerrada y que no puede esperar hasta el siguiente da hbil, puede llamar/localizar a su doctor(a) al nmero que aparece a continuacin.   Por favor, tenga en cuenta que aunque hacemos todo lo posible para estar disponibles para asuntos urgentes fuera del horario de oficina, no estamos disponibles las 24 horas del da, los 7 das de la semana.   Si tiene un problema   urgente y no puede comunicarse con nosotros, puede optar por buscar atencin mdica  en el consultorio de su doctor(a), en una clnica privada, en un centro de atencin urgente o en una sala de emergencias.  Si tiene una emergencia mdica, por favor llame inmediatamente al 911 o vaya a la sala de emergencias.  Nmeros de bper  - Dr. Kowalski: [phone]  - Dra. Moye: [phone]  - Dra. Stewart: [phone]  En caso de inclemencias del tiempo, por favor llame a nuestra lnea principal al [phone] para una actualizacin sobre el estado de cualquier retraso o cierre.  Consejos para la medicacin en dermatologa: Por favor, guarde las cajas en las que vienen los medicamentos de uso tpico para ayudarle a seguir las instrucciones sobre dnde y cmo usarlos. Las farmacias generalmente imprimen las instrucciones del medicamento slo en las cajas y no directamente en los tubos del medicamento.   Si su medicamento es muy caro, por favor, pngase en contacto con nuestra oficina llamando al [phone] y presione la opcin 4 o envenos un mensaje a travs de  MyChart.   No podemos decirle cul ser su copago por los medicamentos por adelantado ya que esto es diferente dependiendo de la cobertura de su seguro. Sin embargo, es posible que podamos encontrar un medicamento sustituto a menor costo o llenar un formulario para que el seguro cubra el medicamento que se considera necesario.   Si se requiere una autorizacin previa para que su compaa de seguros cubra su medicamento, por favor permtanos de 1 a 2 das hbiles para completar este proceso.  Los precios de los medicamentos varan con frecuencia dependiendo del lugar de dnde se surte la receta y alguna farmacias pueden ofrecer precios ms baratos.  El sitio web www.goodrx.com tiene cupones para medicamentos de diferentes farmacias. Los precios aqu no tienen en cuenta lo que podra costar con la ayuda del seguro (puede ser ms barato con su seguro), pero el sitio web puede darle el precio si no utiliz ningn seguro.  - Puede imprimir el cupn correspondiente y llevarlo con su receta a la farmacia.  - Tambin puede pasar por nuestra oficina durante el horario de atencin regular y recoger una tarjeta de cupones de GoodRx.  - Si necesita que su receta se enve electrnicamente a una farmacia diferente, informe a nuestra oficina a travs de MyChart de [place] o por telfono llamando al [phone] y presione la opcin 4.  

## 2022-08-09 ENCOUNTER — Ambulatory Visit: Payer: Managed Care, Other (non HMO) | Admitting: Internal Medicine

## 2022-08-09 ENCOUNTER — Other Ambulatory Visit: Payer: Self-pay | Admitting: Internal Medicine

## 2022-08-09 VITALS — BP 126/72 | HR 69 | Temp 97.9°F | Resp 16 | Ht 64.0 in | Wt 330.0 lb

## 2022-08-09 DIAGNOSIS — E039 Hypothyroidism, unspecified: Secondary | ICD-10-CM

## 2022-08-09 DIAGNOSIS — R1012 Left upper quadrant pain: Secondary | ICD-10-CM

## 2022-08-09 DIAGNOSIS — Z86010 Personal history of colonic polyps: Secondary | ICD-10-CM

## 2022-08-09 DIAGNOSIS — R928 Other abnormal and inconclusive findings on diagnostic imaging of breast: Secondary | ICD-10-CM

## 2022-08-09 DIAGNOSIS — I10 Essential (primary) hypertension: Secondary | ICD-10-CM

## 2022-08-09 DIAGNOSIS — G4730 Sleep apnea, unspecified: Secondary | ICD-10-CM

## 2022-08-09 DIAGNOSIS — K2100 Gastro-esophageal reflux disease with esophagitis, without bleeding: Secondary | ICD-10-CM

## 2022-08-09 DIAGNOSIS — E7800 Pure hypercholesterolemia, unspecified: Secondary | ICD-10-CM

## 2022-08-09 MED ORDER — HYDROCHLOROTHIAZIDE 12.5 MG PO CAPS: ORAL_CAPSULE | ORAL | 3 refills | Status: DC

## 2022-08-09 MED ORDER — LOSARTAN POTASSIUM 100 MG PO TABS: 100.0000 mg | ORAL_TABLET | Freq: Every day | ORAL | 3 refills | Status: DC

## 2022-08-09 NOTE — Progress Notes (Signed)
Subjective:    Patient ID: Shivangi Boddicker, female    DOB: [birth year], 57 y.o.   MRN: [id number]  Patient here for  Chief Complaint  Patient presents with   Medical Management of Chronic Issues    HPI Here to follow up regarding hypercholesterolemia and hypertension.  Remains on losartan and lipitor. Recent calcium score - 0.  Trying to stay active.  No chest pain or sob reported.  No abdominal pain or bowel change reported.  Some LUQ tenderness to palpation.  No nausea or vomiting. No bowel change reported.  Blood pressures doing well - 120s/60-72.    Past Medical History:  Diagnosis Date   Anemia    Asthma    Basal cell carcinoma 04/09/2019   left lateral calf   GERD (gastroesophageal reflux disease)    History of chicken pox    Hypercholesterolemia    Hypertension    Hypothyroidism    Seasonal allergies    Sleep apnea    Past Surgical History:  Procedure Laterality Date   arthroscopic surgery     Dr Hyacinth Meeker   BREAST CYST ASPIRATION Right 2005   BREAST SURGERY  4 /05   biopsy   CHOLECYSTECTOMY  2007   COLONOSCOPY N/A 06/01/2022   Procedure: COLONOSCOPY;  Surgeon: Jaynie Collins, DO;  Location: [facility] ENDOSCOPY;  Service: Gastroenterology;  Laterality: N/A;   COLONOSCOPY WITH PROPOFOL N/A 02/14/2016   Procedure: COLONOSCOPY WITH PROPOFOL;  Surgeon: Scot Jun, MD;  Location: [facility] ENDOSCOPY;  Service: Endoscopy;  Laterality: N/A;   DILATION AND CURETTAGE OF UTERUS  8/04   LAPAROSCOPIC GASTRIC BANDING  01/29/09   MYOMECTOMY  9/04   VAGINAL HYSTERECTOMY  06/11/03   Family History  Problem Relation Age of Onset   Lung cancer Mother    Arthritis Mother    Stroke Mother    Hypertension Mother    Colon polyps Mother    Thyroid disease Sister        graves disease   Colon cancer Other        maternal grandparent   Hyperlipidemia Father    Stroke Maternal Aunt    Stroke Maternal Uncle    Cancer Maternal Grandmother        colon   Alcohol abuse Cousin     Breast cancer Neg Hx    Heart disease Neg Hx    Social History   Socioeconomic History   Marital status: Single    Spouse name: Not on file   Number of children: 0   Years of education: Not on file   Highest education level: Bachelor's degree (e.g., BA, AB, BS)  Occupational History    Employer: [employer]  Tobacco Use   Smoking status: Never   Smokeless tobacco: Never  Vaping Use   Vaping Use: Never used  Substance and Sexual Activity   Alcohol use: No    Alcohol/week: 0.0 standard drinks of alcohol    Comment: 1 drink/month   Drug use: No   Sexual activity: Not on file  Other Topics Concern   Not on file  Social History Narrative   Not on file   Social Determinants of Health   Financial Resource Strain: Low Risk  (08/06/2022)   Overall Financial Resource Strain (CARDIA)    Difficulty of Paying Living Expenses: Not hard at all  Food Insecurity: No Food Insecurity (08/06/2022)   Hunger Vital Sign    Worried About Running Out of Food in the Last Year: Never true  Ran Out of Food in the Last Year: Never true  Transportation Needs: No Transportation Needs (08/06/2022)   PRAPARE - Administrator, Civil Service (Medical): No    Lack of Transportation (Non-Medical): No  Physical Activity: Unknown (08/06/2022)   Exercise Vital Sign    Days of Exercise per Week: 4 days    Minutes of Exercise per Session: Not on file  Stress: No Stress Concern Present (08/06/2022)   [employer] of Occupational Health - Occupational Stress Questionnaire    Feeling of Stress : Not at all  Social Connections: Moderately Integrated (08/06/2022)   Social Connection and Isolation Panel [NHANES]    Frequency of Communication with Friends and Family: More than three times a week    Frequency of Social Gatherings with Friends and Family: Once a week    Attends Religious Services: More than 4 times per year    Active Member of [employer] or Organizations: No    Attends Museum/gallery exhibitions officer: Not on file    Marital Status: Living with partner     Review of Systems  Constitutional:  Negative for appetite change and unexpected weight change.  HENT:  Negative for congestion and sinus pressure.   Respiratory:  Negative for cough, chest tightness and shortness of breath.   Cardiovascular:  Negative for chest pain and palpitations.  Gastrointestinal:  Negative for diarrhea, nausea and vomiting.  Genitourinary:  Negative for difficulty urinating and dysuria.  Musculoskeletal:  Negative for joint swelling and myalgias.  Skin:  Negative for color change and rash.  Neurological:  Negative for dizziness and headaches.  Psychiatric/Behavioral:  Negative for agitation and dysphoric mood.        Objective:     BP 126/72   Pulse 69   Temp 97.9 F (36.6 C)   Resp 16   Ht 5\' 4"  (1.626 m)   Wt (!) 330 lb (149.7 kg)   LMP 06/11/2003   SpO2 98%   BMI 56.64 kg/m  Wt Readings from Last 3 Encounters:  08/09/22 (!) 330 lb (149.7 kg)  06/01/22 (!) 332 lb (150.6 kg)  04/10/22 (!) 330 lb (149.7 kg)    Physical Exam Vitals reviewed.  Constitutional:      General: She is not in acute distress.    Appearance: Normal appearance.  HENT:     Head: Normocephalic and atraumatic.     Right Ear: External ear normal.     Left Ear: External ear normal.  Eyes:     General: No scleral icterus.       Right eye: No discharge.        Left eye: No discharge.     Conjunctiva/sclera: Conjunctivae normal.  Neck:     Thyroid: No thyromegaly.  Cardiovascular:     Rate and Rhythm: Normal rate and regular rhythm.  Pulmonary:     Effort: No respiratory distress.     Breath sounds: Normal breath sounds. No wheezing.  Abdominal:     General: Bowel sounds are normal.     Palpations: Abdomen is soft.     Tenderness: There is no abdominal tenderness.  Musculoskeletal:        General: No swelling or tenderness.     Cervical back: Neck supple. No tenderness.   Lymphadenopathy:     Cervical: No cervical adenopathy.  Skin:    Findings: No erythema or rash.  Neurological:     Mental Status: She is alert.  Psychiatric:  Mood and Affect: Mood normal.        Behavior: Behavior normal.      Outpatient Encounter Medications as of 08/09/2022  Medication Sig   albuterol (VENTOLIN HFA) 108 (90 Base) MCG/ACT inhaler INHALE 2 PUFFS INTO THE LUNGS EVERY 6 HOURS AS NEEDED FOR WHEEZING OR SHORTNESS OF BREATH   atorvastatin (LIPITOR) 10 MG tablet TAKE 1 TABLET BY MOUTH  DAILY   CALCIUM PO Take 300 mg by mouth 4 (four) times daily.   hydrochlorothiazide (MICROZIDE) 12.5 MG capsule TAKE 1 CAPSULE(12.5 MG) BY MOUTH DAILY   levothyroxine (SYNTHROID) 75 MCG tablet TAKE 1 TABLET(75 MCG) BY MOUTH DAILY   losartan (COZAAR) 100 MG tablet Take 1 tablet (100 mg total) by mouth daily.   Multiple Vitamin (MULTIVITAMIN PO) Take by mouth daily.   pantoprazole (PROTONIX) 40 MG tablet TAKE 1 TABLET BY MOUTH  DAILY   venlafaxine XR (EFFEXOR-XR) 37.5 MG 24 hr capsule TAKE 1 CAPSULE BY MOUTH DAILY  WITH BREAKFAST   [DISCONTINUED] hydrochlorothiazide (MICROZIDE) 12.5 MG capsule TAKE 1 CAPSULE(12.5 MG) BY MOUTH DAILY   [DISCONTINUED] losartan (COZAAR) 100 MG tablet TAKE 1 TABLET BY MOUTH  DAILY   No facility-administered encounter medications on file as of 08/09/2022.     Lab Results  Component Value Date   WBC 10.1 04/19/2022   HGB 12.9 04/19/2022   HCT 39.9 04/19/2022   PLT 324 04/19/2022   GLUCOSE 92 04/19/2022   CHOL 172 04/19/2022   TRIG 78 04/19/2022   HDL 50 04/19/2022   LDLCALC 107 (H) 04/19/2022   ALT 11 04/19/2022   AST 14 04/19/2022   NA 142 04/19/2022   K 4.4 04/19/2022   CL 102 04/19/2022   CREATININE 0.74 04/19/2022   BUN 15 04/19/2022   CO2 27 04/19/2022   TSH 0.751 11/14/2021   HGBA1C 5.3 03/19/2015    No results found.     Assessment & Plan:  Primary hypertension Assessment & Plan: On losartan.  Blood pressure as outlined.   Continue current medication regimen. Follow metabolic panel.   Orders: -     Basic metabolic panel  Hypercholesterolemia Assessment & Plan: On lipitor.  Low cholesterol diet and exercise.  Follow lipid panel and liver function tests.    Orders: -     Hepatic function panel -     Lipid panel  Hypothyroidism, unspecified type Assessment & Plan: On thyroid replacement.  Follow tsh.   Orders: -     TSH  Abnormal mammogram Assessment & Plan: Saw Dr Lemar Livings - evaluation abnormal mammogram (02/22/21 - f/u views 03/09/21) - recommended observation.  F/u left breast diagnostic mammogram in 08/2021.  Left breast diagnostic mammogram - area of concern less prominent.  Recommended 6 month f/u. Bilateral diagnostic mammogram 03/27/22 - Birads I. Recommended screening mammogram in one year.    Gastroesophageal reflux disease with esophagitis without hemorrhage Assessment & Plan: Reports no acid reflux.  On protonix.    History of colonic polyps Assessment & Plan: Colonoscopy 06/01/22 - hyperplastic polyp (cecum)   Sleep apnea, unspecified type Assessment & Plan: Had been doing well with weight loss.  Follow.     LUQ pain Assessment & Plan: Pain LUQ.  No change with eating.  No bowel change.  Recent abdominal ultrasound - ok.  Discussed.  Will monitor.  Follow. Notify me if persistent problems.    Other orders -     hydroCHLOROthiazide; TAKE 1 CAPSULE(12.5 MG) BY MOUTH DAILY  Dispense: 90 capsule; Refill: 3 -  Losartan Potassium; Take 1 tablet (100 mg total) by mouth daily.  Dispense: 90 tablet; Refill: 3     Dale Tribune, MD

## 2022-08-14 ENCOUNTER — Encounter: Payer: Self-pay | Admitting: Internal Medicine

## 2022-08-14 DIAGNOSIS — R1012 Left upper quadrant pain: Secondary | ICD-10-CM | POA: Insufficient documentation

## 2022-08-14 NOTE — Assessment & Plan Note (Signed)
On losartan.  Blood pressure as outlined.  Continue current medication regimen. Follow metabolic panel.  

## 2022-08-14 NOTE — Assessment & Plan Note (Signed)
Pain LUQ.  No change with eating.  No bowel change.  Recent abdominal ultrasound - ok.  Discussed.  Will monitor.  Follow. Notify me if persistent problems.

## 2022-08-14 NOTE — Assessment & Plan Note (Signed)
Saw Dr Byrnett - evaluation abnormal mammogram (02/22/21 - f/u views 03/09/21) - recommended observation.  F/u left breast diagnostic mammogram in 08/2021.  Left breast diagnostic mammogram - area of concern less prominent.  Recommended 6 month f/u. Bilateral diagnostic mammogram 03/27/22 - Birads I. Recommended screening mammogram in one year.  

## 2022-08-14 NOTE — Assessment & Plan Note (Signed)
Colonoscopy 06/01/22 - hyperplastic polyp (cecum)

## 2022-08-14 NOTE — Assessment & Plan Note (Signed)
Reports no acid reflux.  On protonix.  

## 2022-08-14 NOTE — Assessment & Plan Note (Signed)
On thyroid replacement.  Follow tsh.  

## 2022-08-14 NOTE — Assessment & Plan Note (Signed)
Had been doing well with weight loss.  Follow.   

## 2022-08-14 NOTE — Assessment & Plan Note (Signed)
On lipitor.  Low cholesterol diet and exercise.  Follow lipid panel and liver function tests.   

## 2022-08-28 ENCOUNTER — Other Ambulatory Visit: Payer: Self-pay

## 2022-08-28 ENCOUNTER — Emergency Department: Payer: Managed Care, Other (non HMO)

## 2022-08-28 ENCOUNTER — Encounter: Payer: Self-pay | Admitting: Emergency Medicine

## 2022-08-28 ENCOUNTER — Telehealth: Payer: Self-pay | Admitting: Internal Medicine

## 2022-08-28 ENCOUNTER — Emergency Department
Admission: EM | Admit: 2022-08-28 | Discharge: 2022-08-28 | Disposition: A | Discharge status: Home / Self Care | Payer: Managed Care, Other (non HMO) | Attending: Emergency Medicine | Admitting: Emergency Medicine

## 2022-08-28 DIAGNOSIS — R1012 Left upper quadrant pain: Secondary | ICD-10-CM | POA: Insufficient documentation

## 2022-08-28 DIAGNOSIS — R112 Nausea with vomiting, unspecified: Secondary | ICD-10-CM | POA: Diagnosis not present

## 2022-08-28 DIAGNOSIS — I10 Essential (primary) hypertension: Secondary | ICD-10-CM | POA: Insufficient documentation

## 2022-08-28 LAB — CBC
HCT: 38.8 % (ref 36.0–46.0)
Hemoglobin: 12 g/dL (ref 12.0–15.0)
MCH: 25.6 pg — ABNORMAL LOW (ref 26.0–34.0)
MCHC: 30.9 g/dL (ref 30.0–36.0)
MCV: 82.9 fL (ref 80.0–100.0)
Platelets: 315 10*3/uL (ref 150–400)
RBC: 4.68 MIL/uL (ref 3.87–5.11)
RDW: 14.6 % (ref 11.5–15.5)
WBC: 9.7 10*3/uL (ref 4.0–10.5)
nRBC: 0 % (ref 0.0–0.2)

## 2022-08-28 LAB — COMPREHENSIVE METABOLIC PANEL
ALT: 15 U/L (ref 0–44)
AST: 21 U/L (ref 15–41)
Albumin: 3.8 g/dL (ref 3.5–5.0)
Alkaline Phosphatase: 81 U/L (ref 38–126)
Anion gap: 11 (ref 5–15)
BUN: 17 mg/dL (ref 6–20)
CO2: 26 mmol/L (ref 22–32)
Calcium: 8.7 mg/dL — ABNORMAL LOW (ref 8.9–10.3)
Chloride: 102 mmol/L (ref 98–111)
Creatinine, Ser: 0.79 mg/dL (ref 0.44–1.00)
GFR, Estimated: >60 mL/min (ref >60)
Glucose, Bld: 98 mg/dL (ref 70–99)
Potassium: 3.8 mmol/L (ref 3.5–5.1)
Sodium: 139 mmol/L (ref 135–145)
Total Bilirubin: 0.5 mg/dL (ref 0.3–1.2)
Total Protein: 6.5 g/dL (ref 6.5–8.1)

## 2022-08-28 LAB — URINALYSIS, ROUTINE W REFLEX MICROSCOPIC
Bilirubin Urine: NEGATIVE
Glucose, UA: NEGATIVE mg/dL
Hgb urine dipstick: NEGATIVE
Ketones, ur: NEGATIVE mg/dL
Leukocytes,Ua: NEGATIVE
Nitrite: NEGATIVE
Protein, ur: NEGATIVE mg/dL
Specific Gravity, Urine: 1.016 (ref 1.005–1.030)
pH: 7 (ref 5.0–8.0)

## 2022-08-28 LAB — LIPASE, BLOOD: Lipase: 33 U/L (ref 11–51)

## 2022-08-28 MED ORDER — ONDANSETRON 4 MG PO TBDP: 4.0000 mg | ORAL_TABLET | Freq: Three times a day (TID) | ORAL | 0 refills | Status: DC | PRN

## 2022-08-28 MED ORDER — SUCRALFATE 1 G PO TABS: 1.0000 g | ORAL_TABLET | Freq: Four times a day (QID) | ORAL | 0 refills | Status: DC

## 2022-08-28 MED ORDER — IOHEXOL 300 MG/ML  SOLN
100.0000 mL | Freq: Once | INTRAMUSCULAR | Status: AC | PRN
  Administered 2022-08-28: 100 mL via INTRAVENOUS

## 2022-08-28 NOTE — ED Provider Triage Note (Signed)
Emergency Medicine Provider Triage Evaluation Note  Kristine Hendricks , a 57 y.o. female  was evaluated in triage.  Pt complains of epigastric pain on and off since April, worsening over last 2 days, causes vomting after eating, no gallbladder.  Review of Systems  Positive:  Negative:   Physical Exam  BP 115/74 (BP Location: Left Arm)   Pulse 60   Temp 98.4 F (36.9 C) (Oral)   Resp 16   Ht 5\' 4"  (1.626 m)   Wt (!) 149.6 kg   LMP 06/11/2003   SpO2 98%   BMI 56.61 kg/m  Gen:   Awake, no distress   Resp:  Normal effort  MSK:   Moves extremities without difficulty  Other:    Medical Decision Making  Medically screening exam initiated at 1:02 PM.  Appropriate orders placed.  Amarylis Rovito was informed that the remainder of the evaluation will be completed by another provider, this initial triage assessment does not replace that evaluation, and the importance of remaining in the ED until their evaluation is complete.     Faythe Ghee, PA-C 08/28/22 1303

## 2022-08-28 NOTE — Telephone Encounter (Signed)
Discussed with Dr Lorin Picket. Will f/u with patient after ED eval

## 2022-08-28 NOTE — ED Provider Notes (Signed)
   [facility] Provider Note    Event Date/Time   First MD Initiated Contact with Patient 08/28/22 1355     (approximate)  History   Chief Complaint: Left upper quadrant abdominal pain  HPI  Kristine Hendricks is a 57 y.o. female with a past medical history of anemia, gastric reflux, hypertension, hyperlipidemia presents to the emergency department for left upper quadrant abdominal pain patient states since April she has been experiencing pain in the left upper quadrant of her abdomen.  Patient states over the weekend she was nauseated with frequent episodes of vomiting which has since worsened the pain.  Patient states the pain is now extending down to the left mid abdomen.  Denies any urinary symptoms.  Denies any diarrhea black or bloody stool.   Physical Exam   Triage Vital Signs: ED Triage Vitals  Enc Vitals Group     BP 08/28/22 1258 115/74     Pulse Rate 08/28/22 1258 60     Resp 08/28/22 1258 16     Temp 08/28/22 1258 98.4 F (36.9 C)     Temp Source 08/28/22 1258 Oral     SpO2 08/28/22 1258 98 %     Weight 08/28/22 1257 (!) 329 lb 12.9 oz (149.6 kg)     Height 08/28/22 1257 5\' 4"  (1.626 m)     Head Circumference --      Peak Flow --      Pain Score 08/28/22 1257 2     Pain Loc --      Pain Edu? --      Excl. in GC? --     Most recent vital signs: Vitals:   08/28/22 1258  BP: 115/74  Pulse: 60  Resp: 16  Temp: 98.4 F (36.9 C)  SpO2: 98%    General: Awake, no distress.  CV:  Good peripheral perfusion.  Regular rate and rhythm  Resp:  Normal effort.  Equal breath sounds bilaterally.  Abd:  No distention.  Soft, nontender.  No rebound or guarding.  ED Results / Procedures / Treatments   RADIOLOGY  CT pending   MEDICATIONS ORDERED IN ED: Medications - No data to display   IMPRESSION / MDM / ASSESSMENT AND PLAN / ED COURSE  I reviewed the triage vital signs and the nursing notes.  Patient's presentation is most consistent  with acute presentation with potential threat to life or bodily function.  Patient presents emergency department left upper quadrant to left mid abdominal pain ongoing since April but worse due to nausea and vomiting over the weekend.  Differential is quite broad but would include gastritis, peptic ulcer disease, esophagitis, colitis or diverticulitis.  Patient states she does take Protonix but has not noticed any difference.  Believe the patient would likely benefit from endoscopy to evaluate.  Will check labs we will proceed with CT scan abdomen/pelvis.  Patient agreeable to plan.  Lab work is reassuring with a normal chemistry including normal LFTs, normal lipase, normal CBC, normal urinalysis.  CT scan is pending.  If CT is normal anticipate likely discharge home with GI follow-up.  Will prescribe Zofran as well as sucralfate to be used in addition to the patient's Protonix.  FINAL CLINICAL IMPRESSION(S) / ED DIAGNOSES   Left upper quadrant abdominal pain  Note:  This document was prepared using Dragon voice recognition software and may include unintentional dictation errors.   Minna Antis, MD 08/28/22 1525

## 2022-08-28 NOTE — ED Triage Notes (Signed)
C/O to Left upper abdominal pain under left breast since fhte beginning of April. Over the weekend, patient had vomiting.  AAOx3.  Skin warm and dry. NAD

## 2022-08-28 NOTE — Telephone Encounter (Signed)
Patient called stating that her lower left breast pain has excalated and she has experienced vomiting on Fri, Sat.and Sun. Transferred patient to Access Nurse.

## 2022-08-28 NOTE — Telephone Encounter (Signed)
FYI- patient has arrived at the ED.

## 2022-08-28 NOTE — Discharge Instructions (Addendum)
Please call the number provided to arrange a follow-up appointment with GI medicine.   Please take your sucralfate before breakfast lunch dinner and before going to bed for the next 2 weeks.  Please continue to take your Protonix as prescribed by your doctor.  You have been prescribed a nausea medication you may take if needed.  Return to the emergency department for any worsening pain or any other symptom personally concerning to yourself.

## 2022-08-28 NOTE — Telephone Encounter (Signed)
Please call and confirm current symptoms.

## 2022-08-28 NOTE — ED Provider Notes (Signed)
I was asked to follow-up on CT scan which was negative.  Patient to be discharged.   Pilar Jarvis, MD 08/28/22 1739

## 2022-08-29 NOTE — Telephone Encounter (Signed)
Called patient to confirm ok. Patient stated that symptoms have resolved and she is seeing Dr Lorin Picket this week.

## 2022-08-29 NOTE — Telephone Encounter (Signed)
LM for patient. Needs Ed f/u with Dr Lorin Picket. Can use appt spot this week.

## 2022-08-30 ENCOUNTER — Telehealth: Payer: Self-pay

## 2022-08-30 NOTE — Transitions of Care (Post Inpatient/ED Visit) (Signed)
Unable to reach pt and left v/m requesting pt to call [phone].     08/30/2022  Name: Kristine Hendricks MRN: [id number] DOB: [birth year]  Today's TOC FU Call Status: Today's TOC FU Call Status:: Unsuccessul Call (1st Attempt) Unsuccessful Call (1st Attempt) Date: 08/30/22  Attempted to reach the patient regarding the most recent Inpatient/ED visit.  Follow Up Plan: Additional outreach attempts will be made to reach the patient to complete the Transitions of Care (Post Inpatient/ED visit) call.   Signature Lewanda Rife, LPN

## 2022-08-30 NOTE — Telephone Encounter (Signed)
Patient returned transitions of care call to rena,would like a call back.

## 2022-08-31 NOTE — Transitions of Care (Post Inpatient/ED Visit) (Cosign Needed)
I spoke with pt; pt was seen at [facility] ED on 08/28/22 with lt upper quad pain;today pt having achiness in ltn upper quad today with pain level 2. No N&V today. Pt has not had to take ondansetron. after initial assessment pt did not see ED physician to go over test findings. pt said nurse came in and removed IV, gave pt papers with instruction but pt said nurse did not go over the discharge instructions. Pt expressed she was not pleased with the lack of info given to pt.Pt experience # [phone] given to pt. I also apologized to pt and pt said she appreciated my call. Pt already has appt scheduled to see Dr Dale Halaula on 09/01/22 at 2:30 and GI appt with Dr Lannette Donath on 09/19/22 at 8:45. Sending note to Dr Dale [place].       08/31/2022  Name: Kristine Hendricks MRN: [id number] DOB: [birth year]  Today's TOC FU Call Status: Today's TOC FU Call Status:: Successful TOC FU Call Competed Unsuccessful Call (1st Attempt) Date: 08/30/22 [facility] FU Call Complete Date: 08/31/22  Transition Care Management Follow-up Telephone Call Date of Discharge: 08/28/22 Discharge Facility: [facility]) Type of Discharge: Emergency Department Reason for ED Visit: Other: (left upper quad pain; dx thinks gastritis) How have you been since you were released from the hospital?: Same (still lt upper quad achiness;pain level now is 2. pt not eating alot of food.no dietary changes given at ED visit.) Any questions or concerns?: No  Items Reviewed: Did you receive and understand the discharge instructions provided?: Yes (after initial assessment pt did not see ED physician to go over test findings. pt said nurse came in and removed IV, gave pt papers with instruction but pt said nurse did not go over the discharge instructions. Pt experience # [phone] given to pt.) Medications obtained,verified, and reconciled?: Yes (Medications Reviewed) (pt is familiar and aware of meds taking with their  instructions.) Any new allergies since your discharge?: No Dietary orders reviewed?: NA (no dietary changes given at ED visit.) Do you have support at home?: Yes People in Home: significant other Name of Support/Comfort Primary Source: Chuck  Medications Reviewed Today: Medications Reviewed Today     Reviewed by Lucas Mallow, RN (Registered Nurse) on 08/28/22 at 1722  Med List Status: <None>   Medication Order Taking? Sig Documenting Provider Last Dose Status Informant  albuterol (VENTOLIN HFA) 108 (90 Base) MCG/ACT inhaler [id number]  INHALE 2 PUFFS INTO THE LUNGS EVERY 6 HOURS AS NEEDED FOR WHEEZING OR SHORTNESS OF Juleen Starr, Westley Hummer, MD  Active   atorvastatin (LIPITOR) 10 MG tablet [id number]  TAKE 1 TABLET BY MOUTH  DAILY Dale [place], MD  Active   CALCIUM PO [id number]  Take 300 mg by mouth 4 (four) times daily. [provider]  Active   hydrochlorothiazide (MICROZIDE) 12.5 MG capsule [id number]  TAKE 1 CAPSULE(12.5 MG) BY MOUTH DAILY Dale [place], MD  Active   levothyroxine (SYNTHROID) 75 MCG tablet [id number]  TAKE 1 TABLET(75 MCG) BY MOUTH DAILY Worthy Rancher B, FNP  Active   losartan (COZAAR) 100 MG tablet [id number]  Take 1 tablet (100 mg total) by mouth daily. Dale [place], MD  Active   Multiple Vitamin (MULTIVITAMIN PO) [id number]  Take by mouth daily. [provider]  Active   pantoprazole (PROTONIX) 40 MG tablet [id number]  TAKE 1 TABLET BY MOUTH  DAILY Dale [place]igma, MD  Active   venlafaxine XR (EFFEXOR-XR) 37.5 MG 24 hr capsule [id number]  TAKE 1 CAPSULE BY MOUTH DAILY  WITH BREAKFAST Dale Malin, MD  Active             Home Care and Equipment/Supplies: Were Home Health Services Ordered?: NA Any new equipment or medical supplies ordered?: NA  Functional Questionnaire: Do you need assistance with bathing/showering or dressing?: No Do you need assistance with meal preparation?: No Do you need assistance with eating?: No Do you have  difficulty maintaining continence: No Do you need assistance with getting out of bed/getting out of a chair/moving?: No Do you have difficulty managing or taking your medications?: No  Follow up appointments reviewed: PCP Follow-up appointment confirmed?: Yes Date of PCP follow-up appointment?: 09/01/22 Follow-up Provider: Dr Westley Hummer [facility] Follow-up appointment confirmed?: Yes Date of Specialist follow-up appointment?: 09/19/22 Follow-Up Specialty Provider:: Dr Lannette Donath GI Do you need transportation to your follow-up appointment?: No Do you understand care options if your condition(s) worsen?: Yes-patient verbalized understanding    SIGNATURE Lewanda Rife, LPN

## 2022-09-01 ENCOUNTER — Ambulatory Visit: Payer: Managed Care, Other (non HMO) | Admitting: Internal Medicine

## 2022-09-01 VITALS — BP 126/74 | HR 66 | Temp 98.0°F | Resp 16 | Ht 64.0 in | Wt 332.0 lb

## 2022-09-01 DIAGNOSIS — I10 Essential (primary) hypertension: Secondary | ICD-10-CM | POA: Diagnosis not present

## 2022-09-01 DIAGNOSIS — K2100 Gastro-esophageal reflux disease with esophagitis, without bleeding: Secondary | ICD-10-CM

## 2022-09-01 DIAGNOSIS — R1013 Epigastric pain: Secondary | ICD-10-CM

## 2022-09-01 NOTE — Progress Notes (Unsigned)
Subjective:    Patient ID: Kristine Hendricks, female    DOB: [birth year], 57 y.o.   MRN: [id number]  Patient here for  Chief Complaint  Patient presents with   Medical Management of Chronic Issues    ED follow up    HPI Here as a work in appt - ER follow up.  Was seen 08/28/22 - with left upper abdominal pain and vomiting.  Reports that symptoms started 08/25/22.  States she ate and then she vomited up - undigested food.  When she would stand up - felt like a wave and would have more vomiting.  This continued - multiple episodes of emesis - over the weekend.  Was able to eat soup on Sunday - no vomiting.  Increased soreness.  Has been trying to eat more bland foods and small amounts.  No more vomiting, but when she does eat - feels fullness top of stomach.  Bowels ok.  No blood.  No diarrhea.  May be more often.  Labs in ER unrevealing.  Lipase wnl.  CT revealed gastric lap band to be in appropriate position.  No acute findings.  Was discharged to follow up with GI.  Has an appt with [facility] 09/19/22.    Past Medical History:  Diagnosis Date   Anemia    Asthma    Basal cell carcinoma 04/09/2019   left lateral calf   GERD (gastroesophageal reflux disease)    History of chicken pox    Hypercholesterolemia    Hypertension    Hypothyroidism    Seasonal allergies    Sleep apnea    Past Surgical History:  Procedure Laterality Date   arthroscopic surgery     Dr Hyacinth Meeker   BREAST CYST ASPIRATION Right 2005   BREAST SURGERY  4 /05   biopsy   CHOLECYSTECTOMY  2007   COLONOSCOPY N/A 06/01/2022   Procedure: COLONOSCOPY;  Surgeon: Jaynie Collins, DO;  Location: [facility] ENDOSCOPY;  Service: Gastroenterology;  Laterality: N/A;   COLONOSCOPY WITH PROPOFOL N/A 02/14/2016   Procedure: COLONOSCOPY WITH PROPOFOL;  Surgeon: Scot Jun, MD;  Location: [facility] ENDOSCOPY;  Service: Endoscopy;  Laterality: N/A;   DILATION AND CURETTAGE OF UTERUS  8/04   LAPAROSCOPIC GASTRIC BANDING  01/29/09    MYOMECTOMY  9/04   VAGINAL HYSTERECTOMY  06/11/03   Family History  Problem Relation Age of Onset   Lung cancer Mother    Arthritis Mother    Stroke Mother    Hypertension Mother    Colon polyps Mother    Thyroid disease Sister        graves disease   Colon cancer Other        maternal grandparent   Hyperlipidemia Father    Stroke Maternal Aunt    Stroke Maternal Uncle    Cancer Maternal Grandmother        colon   Alcohol abuse Cousin    Breast cancer Neg Hx    Heart disease Neg Hx    Social History   Socioeconomic History   Marital status: Single    Spouse name: Not on file   Number of children: 0   Years of education: Not on file   Highest education level: Bachelor's degree (e.g., BA, AB, BS)  Occupational History    Employer: [employer]  Tobacco Use   Smoking status: Never   Smokeless tobacco: Never  Vaping Use   Vaping Use: Never used  Substance and Sexual Activity   Alcohol use:  No    Alcohol/week: 0.0 standard drinks of alcohol    Comment: 1 drink/month   Drug use: No   Sexual activity: Not on file  Other Topics Concern   Not on file  Social History Narrative   Not on file   Social Determinants of Health   Financial Resource Strain: Low Risk  (08/06/2022)   Overall Financial Resource Strain (CARDIA)    Difficulty of Paying Living Expenses: Not hard at all  Food Insecurity: No Food Insecurity (08/06/2022)   Hunger Vital Sign    Worried About Running Out of Food in the Last Year: Never true    Ran Out of Food in the Last Year: Never true  Transportation Needs: No Transportation Needs (08/06/2022)   PRAPARE - Administrator, Civil Service (Medical): No    Lack of Transportation (Non-Medical): No  Physical Activity: Unknown (08/06/2022)   Exercise Vital Sign    Days of Exercise per Week: 4 days    Minutes of Exercise per Session: Not on file  Stress: No Stress Concern Present (08/06/2022)   [employer] of Occupational Health - Occupational  Stress Questionnaire    Feeling of Stress : Not at all  Social Connections: Moderately Integrated (08/06/2022)   Social Connection and Isolation Panel [NHANES]    Frequency of Communication with Friends and Family: More than three times a week    Frequency of Social Gatherings with Friends and Family: Once a week    Attends Religious Services: More than 4 times per year    Active Member of [employer] or Organizations: No    Attends Engineer, structural: Not on file    Marital Status: Living with partner     Review of Systems  Constitutional:  Negative for fever and unexpected weight change.  HENT:  Negative for congestion and sinus pressure.   Respiratory:  Negative for cough, chest tightness and shortness of breath.   Cardiovascular:  Negative for chest pain, palpitations and leg swelling.  Gastrointestinal:        GI symptoms as outlined.   Genitourinary:  Negative for difficulty urinating and dysuria.  Musculoskeletal:  Negative for joint swelling and myalgias.  Skin:  Negative for color change and rash.  Neurological:  Negative for dizziness and headaches.  Psychiatric/Behavioral:  Negative for agitation and dysphoric mood.        Objective:     BP 126/74   Pulse 66   Temp 98 F (36.7 C)   Resp 16   Ht 5\' 4"  (1.626 m)   Wt (!) 332 lb (150.6 kg)   LMP 06/11/2003   SpO2 98%   BMI 56.99 kg/m  Wt Readings from Last 3 Encounters:  09/01/22 (!) 332 lb (150.6 kg)  08/28/22 (!) 329 lb 12.9 oz (149.6 kg)  08/09/22 (!) 330 lb (149.7 kg)    Physical Exam Vitals reviewed.  Constitutional:      General: She is not in acute distress.    Appearance: Normal appearance.  HENT:     Head: Normocephalic and atraumatic.     Right Ear: External ear normal.     Left Ear: External ear normal.  Eyes:     General: No scleral icterus.       Right eye: No discharge.        Left eye: No discharge.     Conjunctiva/sclera: Conjunctivae normal.  Neck:     Thyroid: No  thyromegaly.  Cardiovascular:     Rate and  Rhythm: Normal rate and regular rhythm.  Pulmonary:     Effort: No respiratory distress.     Breath sounds: Normal breath sounds. No wheezing.  Abdominal:     General: Bowel sounds are normal.     Palpations: Abdomen is soft.     Comments: Increased tenderness to palpation - epigastric region.    Musculoskeletal:        General: No swelling or tenderness.     Cervical back: Neck supple. No tenderness.  Lymphadenopathy:     Cervical: No cervical adenopathy.  Skin:    Findings: No erythema or rash.  Neurological:     Mental Status: She is alert.  Psychiatric:        Mood and Affect: Mood normal.        Behavior: Behavior normal.      Outpatient Encounter Medications as of 09/01/2022  Medication Sig   albuterol (VENTOLIN HFA) 108 (90 Base) MCG/ACT inhaler INHALE 2 PUFFS INTO THE LUNGS EVERY 6 HOURS AS NEEDED FOR WHEEZING OR SHORTNESS OF BREATH   atorvastatin (LIPITOR) 10 MG tablet TAKE 1 TABLET BY MOUTH  DAILY   CALCIUM PO Take 300 mg by mouth 4 (four) times daily.   hydrochlorothiazide (MICROZIDE) 12.5 MG capsule TAKE 1 CAPSULE(12.5 MG) BY MOUTH DAILY   levothyroxine (SYNTHROID) 75 MCG tablet TAKE 1 TABLET(75 MCG) BY MOUTH DAILY   losartan (COZAAR) 100 MG tablet Take 1 tablet (100 mg total) by mouth daily.   Multiple Vitamin (MULTIVITAMIN PO) Take by mouth daily.   ondansetron (ZOFRAN-ODT) 4 MG disintegrating tablet Take 1 tablet (4 mg total) by mouth every 8 (eight) hours as needed for nausea or vomiting.   pantoprazole (PROTONIX) 40 MG tablet TAKE 1 TABLET BY MOUTH  DAILY   sucralfate (CARAFATE) 1 g tablet Take 1 tablet (1 g total) by mouth 4 (four) times daily.   venlafaxine XR (EFFEXOR-XR) 37.5 MG 24 hr capsule TAKE 1 CAPSULE BY MOUTH DAILY  WITH BREAKFAST   No facility-administered encounter medications on file as of 09/01/2022.     Lab Results  Component Value Date   WBC 9.7 08/28/2022   HGB 12.0 08/28/2022   HCT 38.8  08/28/2022   PLT 315 08/28/2022   GLUCOSE 98 08/28/2022   CHOL 172 04/19/2022   TRIG 78 04/19/2022   HDL 50 04/19/2022   LDLCALC 107 (H) 04/19/2022   ALT 15 08/28/2022   AST 21 08/28/2022   NA 139 08/28/2022   K 3.8 08/28/2022   CL 102 08/28/2022   CREATININE 0.79 08/28/2022   BUN 17 08/28/2022   CO2 26 08/28/2022   TSH 0.751 11/14/2021   HGBA1C 5.3 03/19/2015    CT ABDOMEN PELVIS W CONTRAST  Result Date: 08/28/2022 CLINICAL DATA:  Worsening left upper quadrant pain for several months. Nausea and vomiting. EXAM: CT ABDOMEN AND PELVIS WITH CONTRAST TECHNIQUE: Multidetector CT imaging of the abdomen and pelvis was performed using the standard protocol following bolus administration of intravenous contrast. RADIATION DOSE REDUCTION: This exam was performed according to the departmental dose-optimization program which includes automated exposure control, adjustment of the mA and/or kV according to patient size and/or use of iterative reconstruction technique. CONTRAST:  OMNIPAQUE IOHEXOL 300 MG/ML  SOLN COMPARISON:  None Available. FINDINGS: Lower Chest: No acute findings. Hepatobiliary: No hepatic masses identified. Prior cholecystectomy. No evidence of biliary obstruction. Pancreas:  No mass or inflammatory changes. Spleen: Within normal limits in size and appearance. Adrenals/Urinary Tract: No suspicious masses identified. No evidence of  ureteral calculi or hydronephrosis. Stomach/Bowel: Gastric lap band is seen in appropriate position. No evidence of obstruction, inflammatory process or abnormal fluid collections. Normal appendix visualized. Diverticulosis is seen mainly involving the descending and sigmoid colon, however there is no evidence of diverticulitis. Vascular/Lymphatic: No pathologically enlarged lymph nodes. No acute vascular findings. Reproductive: Prior hysterectomy noted. Adnexal regions are unremarkable in appearance. Other:  None. Musculoskeletal:  No suspicious bone  lesions identified. IMPRESSION: No acute findings. Colonic diverticulosis, without radiographic evidence of diverticulitis. Gastric lap band in appropriate position. Electronically Signed   By: Danae Orleans M.D.   On: 08/28/2022 16:37       Assessment & Plan:  Epigastric pain Assessment & Plan: Recent ER evaluation as outlined.  Emesis and abdominal fullness and discomfort.  CT revealed no acute findings. Colonic diverticulosis, without radiographic evidence of diverticulitis. Gastric lap band in appropriate position. The emesis has stopped.  Has adjusted diet - bland diet.  Still feels fullness after eating and some tenderness to palpation - epigastric region.  Has appt with GI 09/19/22.  Will keep appt - evaluation - question of need for EGD.     Gastroesophageal reflux disease with esophagitis without hemorrhage Assessment & Plan: Reports no acid reflux.  On protonix.    Primary hypertension Assessment & Plan: On losartan.  Blood pressure as outlined.  Continue current medication regimen. Follow metabolic panel.       Dale Gumlog, MD

## 2022-09-03 ENCOUNTER — Encounter: Payer: Self-pay | Admitting: Internal Medicine

## 2022-09-03 DIAGNOSIS — R109 Unspecified abdominal pain: Secondary | ICD-10-CM | POA: Insufficient documentation

## 2022-09-03 NOTE — Assessment & Plan Note (Signed)
Recent ER evaluation as outlined.  Emesis and abdominal fullness and discomfort.  CT revealed no acute findings. Colonic diverticulosis, without radiographic evidence of diverticulitis. Gastric lap band in appropriate position. The emesis has stopped.  Has adjusted diet - bland diet.  Still feels fullness after eating and some tenderness to palpation - epigastric region.  Has appt with GI 09/19/22.  Will keep appt - evaluation - question of need for EGD.

## 2022-09-03 NOTE — Assessment & Plan Note (Signed)
On losartan.  Blood pressure as outlined.  Continue current medication regimen. Follow metabolic panel.  

## 2022-09-03 NOTE — Assessment & Plan Note (Signed)
Reports no acid reflux.  On protonix.  

## 2022-09-14 ENCOUNTER — Other Ambulatory Visit: Payer: Self-pay

## 2022-09-18 NOTE — Progress Notes (Unsigned)
Celso Amy, PA-C [address]  [address]  [place], [place] [address]  Main: [phone]  Fax: [phone]   Gastroenterology Consultation  Referring Provider:     Dale Gowrie, MD Primary Care Physician:  Dale Bartlett, MD Primary Gastroenterologist:  Celso Amy, PA-C / Dr. Wyline Mood  Reason for Consultation:     ED follow-up Epigastric & LUQ pain, nausea, vomiting, GERD        HPI:   Kristine Hendricks is a 57 y.o. y/o female referred for consultation & management  by Dale [place], MD.    Patient states she has had intermittent episodes of epigastric and LUQ pain with nausea and vomiting which started acutely April 2024.  Prior to that she had no symptoms.  She feels like her stomach is spasming.  She has shooting sharp epigastric pain episodes which is relieved after vomiting.  Her epigastrium and left upper quadrant is sore to palpation.  Last episode occurred 08/28/2022.  Her upper abdominal pain has currently improved.  ED follow-up LUQ pain.  Went to ED 08/28/2022.  History of gastric lap band in 2010, anemia, acid reflux, CCY, Hysterectomy.  Has had LUQ pain since 06/2022.  Episodes of nausea and vomiting. Taking pantoprazole 40 Mg daily.  She was started on sucralfate 1 g 4 times daily which did not help, and she discontinued sucralfate.  Labs 08/28/2022 showed normal CBC, CMP, and lipase. Abdominal pelvic CT 08/28/2022 showed no acute abnormality.  Gastric lap band in appropriate position.  Complete abdominal ultrasound 09/2021 was normal.  Previous cholecystectomy.  Previous patient at [facility], transferring care to our office.  -Last colonoscopy 05/2022 by Dr. Timothy Lasso showed 1 small 2 mm hyperplastic polyp removed from cecum.  Diverticulosis, tortuous colon, internal hemorrhoids. -Colonoscopy 01/2016 -2  diminutive hyperplastic rectal polyps removed. -Last EGD 04/2014 - Normal.    Past Medical History:  Diagnosis Date   Anemia    Asthma     Basal cell carcinoma 04/09/2019   left lateral calf   GERD (gastroesophageal reflux disease)    History of chicken pox    Hypercholesterolemia    Hypertension    Hypothyroidism    Seasonal allergies    Sleep apnea     Past Surgical History:  Procedure Laterality Date   arthroscopic surgery     Dr Hyacinth Meeker   BREAST CYST ASPIRATION Right 2005   BREAST SURGERY  4 /05   biopsy   CHOLECYSTECTOMY  2007   COLONOSCOPY N/A 06/01/2022   Procedure: COLONOSCOPY;  Surgeon: Jaynie Collins, DO;  Location: [facility] ENDOSCOPY;  Service: Gastroenterology;  Laterality: N/A;   COLONOSCOPY WITH PROPOFOL N/A 02/14/2016   Procedure: COLONOSCOPY WITH PROPOFOL;  Surgeon: Scot Jun, MD;  Location[facility] ENDOSCOPY;  Service: Endoscopy;  Laterality: N/A;   DILATION AND CURETTAGE OF UTERUS  8/04   LAPAROSCOPIC GASTRIC BANDING  01/29/09   MYOMECTOMY  9/04   VAGINAL HYSTERECTOMY  06/11/03    Prior to Admission medications   Medication Sig Start Date End Date Taking? Authorizing Provider  albuterol (VENTOLIN HFA) 108 (90 Base) MCG/ACT inhaler INHALE 2 PUFFS INTO THE LUNGS EVERY 6 HOURS AS NEEDED FOR WHEEZING OR SHORTNESS OF BREATH 10/14/21   Dale [place], MD  atorvastatin (LIPITOR) 10 MG tablet TAKE 1 TABLET BY MOUTH  DAILY 02/03/22   Dale Neabsco, MD  CALCIUM PO Take 300 mg by mouth 4 (four) times daily.    [provider]  hydrochlorothiazide (MICROZIDE) 12.5 MG  capsule TAKE 1 CAPSULE(12.5 MG) BY MOUTH DAILY 08/09/22   Dale [place], MD  levothyroxine (SYNTHROID) 75 MCG tablet TAKE 1 TABLET(75 MCG) BY MOUTH DAILY 11/08/21   Worthy Rancher B, FNP  losartan (COZAAR) 100 MG tablet Take 1 tablet (100 mg total) by mouth daily. 08/09/22   Dale Clayton, MD  Multiple Vitamin (MULTIVITAMIN PO) Take by mouth daily.    [provider]  ondansetron (ZOFRAN-ODT) 4 MG disintegrating tablet Take 1 tablet (4 mg total) by mouth every 8 (eight) hours as needed for nausea or vomiting. 08/28/22    Minna Antis, MD  pantoprazole (PROTONIX) 40 MG tablet TAKE 1 TABLET BY MOUTH  DAILY 02/03/22   Dale Kelford, MD  sucralfate (CARAFATE) 1 g tablet Take 1 tablet (1 g total) by mouth 4 (four) times daily. 08/28/22 08/28/23  Minna Antis, MD  venlafaxine XR (EFFEXOR-XR) 37.5 MG 24 hr capsule TAKE 1 CAPSULE BY MOUTH DAILY  WITH BREAKFAST 05/01/22   Dale Ecru, MD    Family History  Problem Relation Age of Onset   Lung cancer Mother    Arthritis Mother    Stroke Mother    Hypertension Mother    Colon polyps Mother    Thyroid disease Sister        graves disease   Colon cancer Other        maternal grandparent   Hyperlipidemia Father    Stroke Maternal Aunt    Stroke Maternal Uncle    Cancer Maternal Grandmother        colon   Alcohol abuse Cousin    Breast cancer Neg Hx    Heart disease Neg Hx      Social History   Tobacco Use   Smoking status: Never   Smokeless tobacco: Never  Vaping Use   Vaping Use: Never used  Substance Use Topics   Alcohol use: No    Alcohol/week: 0.0 standard drinks of alcohol    Comment: 1 drink/month   Drug use: No    Allergies as of 09/19/2022 - Review Complete 09/19/2022  Allergen Reaction Noted   Sulfa antibiotics Rash 11/25/2010   Sulfasalazine Rash 11/25/2010   Codeine Nausea And Vomiting 11/25/2010    Review of Systems:    All systems reviewed and negative except where noted in HPI.   Physical Exam:  BP 124/83   Pulse 61   Temp 98.4 F (36.9 C)   Ht 5\' 4"  (1.626 m)   Wt (!) 330 lb 9.6 oz (150 kg)   LMP 06/11/2003   BMI 56.75 kg/m  Patient's last menstrual period was 06/11/2003. Psych:  Alert and cooperative. Normal mood and affect. General:   Alert,  Well-developed, well-nourished, pleasant and cooperative in NAD Head:  Normocephalic and atraumatic. Eyes:  Sclera clear, no icterus.   Conjunctiva pink. Neck:  Supple; no masses or thyromegaly. Lungs:  Respirations even and unlabored.  Clear throughout to  auscultation.   No wheezes, crackles, or rhonchi. No acute distress. Heart:  Regular rate and rhythm; no murmurs, clicks, rubs, or gallops. Abdomen:  Normal bowel sounds.  No bruits.  Soft, and non-distended without masses, hepatosplenomegaly or hernias noted.  No Tenderness.  No guarding or rebound tenderness.    Neurologic:  Alert and oriented x3;  grossly normal neurologically. Psych:  Alert and cooperative. Normal mood and affect.  Imaging Studies: CT ABDOMEN PELVIS W CONTRAST  Result Date: 08/28/2022 CLINICAL DATA:  Worsening left upper quadrant pain for several months. Nausea and vomiting. EXAM: CT  ABDOMEN AND PELVIS WITH CONTRAST TECHNIQUE: Multidetector CT imaging of the abdomen and pelvis was performed using the standard protocol following bolus administration of intravenous contrast. RADIATION DOSE REDUCTION: This exam was performed according to the departmental dose-optimization program which includes automated exposure control, adjustment of the mA and/or kV according to patient size and/or use of iterative reconstruction technique. CONTRAST:  OMNIPAQUE IOHEXOL 300 MG/ML  SOLN COMPARISON:  None Available. FINDINGS: Lower Chest: No acute findings. Hepatobiliary: No hepatic masses identified. Prior cholecystectomy. No evidence of biliary obstruction. Pancreas:  No mass or inflammatory changes. Spleen: Within normal limits in size and appearance. Adrenals/Urinary Tract: No suspicious masses identified. No evidence of ureteral calculi or hydronephrosis. Stomach/Bowel: Gastric lap band is seen in appropriate position. No evidence of obstruction, inflammatory process or abnormal fluid collections. Normal appendix visualized. Diverticulosis is seen mainly involving the descending and sigmoid colon, however there is no evidence of diverticulitis. Vascular/Lymphatic: No pathologically enlarged lymph nodes. No acute vascular findings. Reproductive: Prior hysterectomy noted. Adnexal regions are  unremarkable in appearance. Other:  None. Musculoskeletal:  No suspicious bone lesions identified. IMPRESSION: No acute findings. Colonic diverticulosis, without radiographic evidence of diverticulitis. Gastric lap band in appropriate position. Electronically Signed   By: Danae Orleans M.D.   On: 08/28/2022 16:37    Assessment and Plan:   Aithana Guin is a 57 y.o. y/o female has been referred for epigastric pain, LUQ pain, nausea, and vomiting episodes which started acutely 06/2022.  Currently she is feeling better.  Previous cholecystectomy and lap band surgery.  History of GERD.  Recent labs and abdominal pelvic CT were normal.  I am scheduling EGD to evaluate for gastritis, peptic ulcer, and H. pylori.  1.  Epigastric and LUQ pain since 06/2022 2.  Nausea and vomiting since 06/2022 3.  History of gastric lap band placed in 2010.  Cholecystectomy in 2007. 4.  GERD  Plan:  1.)  Scheduling EGD I discussed risks of EGD with patient to include risk of bleeding, perforation, and risk of sedation.  Patient expressed understanding and agrees to proceed with EGD.   2.)  Continue pantoprazole 40mg  1 tablet once daily.  She is no longer taking sucralfate.  It did not help.   Follow up will be based on EGD results and GI symptoms.  Celso Amy, PA-C

## 2022-09-19 ENCOUNTER — Ambulatory Visit: Payer: Managed Care, Other (non HMO) | Admitting: Physician Assistant

## 2022-09-19 ENCOUNTER — Encounter: Payer: Self-pay | Admitting: Physician Assistant

## 2022-09-19 VITALS — BP 124/83 | HR 61 | Temp 98.4°F | Ht 64.0 in | Wt 330.6 lb

## 2022-09-19 DIAGNOSIS — R112 Nausea with vomiting, unspecified: Secondary | ICD-10-CM | POA: Diagnosis not present

## 2022-09-19 DIAGNOSIS — R1013 Epigastric pain: Secondary | ICD-10-CM | POA: Diagnosis not present

## 2022-09-19 DIAGNOSIS — Z9884 Bariatric surgery status: Secondary | ICD-10-CM | POA: Diagnosis not present

## 2022-10-11 LAB — LAB REPORT - SCANNED
A1c: 5.8
EGFR: 76

## 2022-10-25 ENCOUNTER — Encounter: Payer: Self-pay | Admitting: Gastroenterology

## 2022-11-01 ENCOUNTER — Ambulatory Visit: Payer: Managed Care, Other (non HMO) | Admitting: Anesthesiology

## 2022-11-01 ENCOUNTER — Encounter
Admission: RE | Disposition: A | Discharge status: Home / Self Care | Payer: Self-pay | Source: Ambulatory Visit | Attending: Gastroenterology

## 2022-11-01 ENCOUNTER — Encounter: Payer: Self-pay | Admitting: Gastroenterology

## 2022-11-01 ENCOUNTER — Other Ambulatory Visit: Payer: Self-pay

## 2022-11-01 ENCOUNTER — Ambulatory Visit
Admission: RE | Admit: 2022-11-01 | Discharge: 2022-11-01 | Disposition: A | Discharge status: Home / Self Care | Payer: Managed Care, Other (non HMO) | Source: Ambulatory Visit | Attending: Gastroenterology | Admitting: Gastroenterology

## 2022-11-01 DIAGNOSIS — E039 Hypothyroidism, unspecified: Secondary | ICD-10-CM | POA: Diagnosis not present

## 2022-11-01 DIAGNOSIS — G4730 Sleep apnea, unspecified: Secondary | ICD-10-CM | POA: Diagnosis not present

## 2022-11-01 DIAGNOSIS — K317 Polyp of stomach and duodenum: Secondary | ICD-10-CM

## 2022-11-01 DIAGNOSIS — K219 Gastro-esophageal reflux disease without esophagitis: Secondary | ICD-10-CM | POA: Insufficient documentation

## 2022-11-01 DIAGNOSIS — K2960 Other gastritis without bleeding: Secondary | ICD-10-CM | POA: Insufficient documentation

## 2022-11-01 DIAGNOSIS — E7800 Pure hypercholesterolemia, unspecified: Secondary | ICD-10-CM | POA: Insufficient documentation

## 2022-11-01 DIAGNOSIS — I10 Essential (primary) hypertension: Secondary | ICD-10-CM | POA: Insufficient documentation

## 2022-11-01 DIAGNOSIS — R1013 Epigastric pain: Secondary | ICD-10-CM

## 2022-11-01 DIAGNOSIS — Z85828 Personal history of other malignant neoplasm of skin: Secondary | ICD-10-CM | POA: Insufficient documentation

## 2022-11-01 DIAGNOSIS — Z79899 Other long term (current) drug therapy: Secondary | ICD-10-CM | POA: Diagnosis not present

## 2022-11-01 DIAGNOSIS — J45909 Unspecified asthma, uncomplicated: Secondary | ICD-10-CM | POA: Insufficient documentation

## 2022-11-01 HISTORY — PX: ESOPHAGOGASTRODUODENOSCOPY (EGD) WITH PROPOFOL: SHX5813

## 2022-11-01 HISTORY — PX: POLYPECTOMY: SHX5525

## 2022-11-01 HISTORY — PX: BIOPSY: SHX5522

## 2022-11-01 SURGERY — ESOPHAGOGASTRODUODENOSCOPY (EGD) WITH PROPOFOL
Anesthesia: General

## 2022-11-01 MED ORDER — DEXMEDETOMIDINE HCL IN NACL 200 MCG/50ML IV SOLN
INTRAVENOUS | Status: DC | PRN
Start: 2022-11-01 — End: 2022-11-01
  Administered 2022-11-01 (×2): 8 ug via INTRAVENOUS
  Administered 2022-11-01: 4 ug via INTRAVENOUS

## 2022-11-01 MED ORDER — GLYCOPYRROLATE 0.2 MG/ML IJ SOLN
INTRAMUSCULAR | Status: DC | PRN
  Administered 2022-11-01: .2 mg via INTRAVENOUS

## 2022-11-01 MED ORDER — SODIUM CHLORIDE 0.9 % IV SOLN: INTRAVENOUS | Status: DC

## 2022-11-01 MED ORDER — PROPOFOL 10 MG/ML IV BOLUS
INTRAVENOUS | Status: DC | PRN
Start: 2022-11-01 — End: 2022-11-01
  Administered 2022-11-01: 30 mg via INTRAVENOUS
  Administered 2022-11-01: 70 mg via INTRAVENOUS

## 2022-11-01 MED ORDER — PROPOFOL 500 MG/50ML IV EMUL
INTRAVENOUS | Status: DC | PRN
  Administered 2022-11-01: 145 ug/kg/min via INTRAVENOUS

## 2022-11-01 MED ORDER — LIDOCAINE HCL (CARDIAC) PF 100 MG/5ML IV SOSY
PREFILLED_SYRINGE | INTRAVENOUS | Status: DC | PRN
  Administered 2022-11-01: 100 mg via INTRAVENOUS

## 2022-11-01 NOTE — Anesthesia Procedure Notes (Signed)
Procedure Name: General with mask airway Date/Time: 11/01/2022 10:08 AM  Performed by: Mohammed Kindle, CRNAPre-anesthesia Checklist: Patient identified, Emergency Drugs available, Suction available and Patient being monitored Patient Re-evaluated:Patient Re-evaluated prior to induction Oxygen Delivery Method: Simple face mask Induction Type: IV induction Placement Confirmation: positive ETCO2, CO2 detector and breath sounds checked- equal and bilateral Dental Injury: Teeth and Oropharynx as per pre-operative assessment

## 2022-11-01 NOTE — Anesthesia Preprocedure Evaluation (Signed)
Anesthesia Evaluation  Patient identified by MRN, date of birth, ID band Patient awake    Reviewed: Allergy & Precautions, H&P , NPO status , Patient's Chart, lab work & pertinent test results, reviewed documented beta blocker date and time   Airway Mallampati: II   Neck ROM: full    Dental  (+) Poor Dentition   Pulmonary asthma , sleep apnea    Pulmonary exam normal        Cardiovascular Exercise Tolerance: Good hypertension, On Medications negative cardio ROS Normal cardiovascular exam Rhythm:regular Rate:Normal     Neuro/Psych  Headaches  negative psych ROS   GI/Hepatic Neg liver ROS,GERD  Medicated,,  Endo/Other  Hypothyroidism    Renal/GU negative Renal ROS  negative genitourinary   Musculoskeletal   Abdominal   Peds  Hematology  (+) Blood dyscrasia, anemia   Anesthesia Other Findings Past Medical History: No date: Anemia No date: Asthma 04/09/2019: Basal cell carcinoma     Comment:  left lateral calf No date: GERD (gastroesophageal reflux disease) No date: History of chicken pox No date: Hypercholesterolemia No date: Hypertension No date: Hypothyroidism No date: Seasonal allergies No date: Sleep apnea Past Surgical History: No date: arthroscopic surgery     Comment:  Dr Hyacinth Meeker 2005: BREAST CYST ASPIRATION; Right 4 /05: BREAST SURGERY     Comment:  biopsy 2007: CHOLECYSTECTOMY 06/01/2022: COLONOSCOPY; N/A     Comment:  Procedure: COLONOSCOPY;  Surgeon: Jaynie Collins,              DO;  Location: [facility] ENDOSCOPY;  Service:               Gastroenterology;  Laterality: N/A; 02/14/2016: COLONOSCOPY WITH PROPOFOL; N/A     Comment:  Procedure: COLONOSCOPY WITH PROPOFOL;  Surgeon: Scot Jun, MD;  Location: [facility] ENDOSCOPY;  Service:               Endoscopy;  Laterality: N/A; 8/04: DILATION AND CURETTAGE OF UTERUS 01/29/09: LAPAROSCOPIC GASTRIC BANDING 9/04:  MYOMECTOMY 06/11/03: VAGINAL HYSTERECTOMY   Reproductive/Obstetrics negative OB ROS                             Anesthesia Physical Anesthesia Plan  ASA: 3  Anesthesia Plan: General   Post-op Pain Management:    Induction:   PONV Risk Score and Plan:   Airway Management Planned:   Additional Equipment:   Intra-op Plan:   Post-operative Plan:   Informed Consent: I have reviewed the patients History and Physical, chart, labs and discussed the procedure including the risks, benefits and alternatives for the proposed anesthesia with the patient or authorized representative who has indicated his/her understanding and acceptance.     Dental Advisory Given  Plan Discussed with: CRNA  Anesthesia Plan Comments:        Anesthesia Quick Evaluation

## 2022-11-01 NOTE — Op Note (Signed)
[facility] Gastroenterology Patient Name: Kristine Hendricks Procedure Date: 11/01/2022 9:56 AM MRN: [id number] Account #: [id number] Date of Birth: 08/12/1965 Admit Type: Outpatient Age: 57 Room: [facility] ENDO ROOM 3 Gender: Female Note Status: Finalized Instrument Name: Upper Endoscope [id number] Procedure:             Upper GI endoscopy Indications:           Dyspepsia Providers:             Wyline Mood MD, MD Referring MD:          Dale Colwell, MD (Referring MD) Medicines:             Monitored Anesthesia Care Complications:         No immediate complications. Procedure:             Pre-Anesthesia Assessment:                        - Prior to the procedure, a History and Physical was                         performed, and patient medications, allergies and                         sensitivities were reviewed. The patient's tolerance                         of previous anesthesia was reviewed.                        - The risks and benefits of the procedure and the                         sedation options and risks were discussed with the                         patient. All questions were answered and informed                         consent was obtained.                        - ASA Grade Assessment: II - A patient with mild                         systemic disease.                        After obtaining informed consent, the endoscope was                         passed under direct vision. Throughout the procedure,                         the patient's blood pressure, pulse, and oxygen                         saturations were monitored continuously. The Endoscope                         was introduced through  the mouth, and advanced to the                         third part of duodenum. The upper GI endoscopy was                         accomplished with ease. The patient tolerated the                         procedure well. Findings:      The examined duodenum was  normal.      The esophagus was normal.      Multiple 6 to 12 mm semi-sessile polyps with no bleeding and no stigmata       of recent bleeding were found in the gastric fundus. The polyp was       removed with a hot snare. Resection and retrieval were complete. 3 of       thelarge polyps rescted      The cardia and gastric fundus were normal on retroflexion.      Normal mucosa was found in the gastric antrum. Biopsies were taken with       a cold forceps for histology. Impression:            - Normal examined duodenum.                        - Normal esophagus.                        - Multiple gastric polyps. Resected and retrieved. Recommendation:        - Discharge patient to home (with escort).                        - Resume previous diet.                        - Continue present medications.                        - Await pathology results.                        - Return to GI office as previously scheduled. Procedure Code(s):     --- Professional ---                        [id number], Esophagogastroduodenoscopy, flexible,                         transoral; with removal of tumor(s), polyp(s), or                         other lesion(s) by snare technique Diagnosis Code(s):     --- Professional ---                        K31.7, Polyp of stomach and duodenum                        R10.13, Epigastric pain CPT copyright 2022 American Medical Association. All rights reserved. The codes documented in this report are preliminary and upon coder  review may  be revised to meet current compliance requirements. Wyline Mood, MD Wyline Mood MD, MD 11/01/2022 10:20:27 AM This report has been signed electronically. Number of Addenda: 0 Note Initiated On: 11/01/2022 9:56 AM Estimated Blood Loss:  Estimated blood loss: none.      [facility]

## 2022-11-01 NOTE — H&P (Signed)
Wyline Mood, MD [address], [address], [place], [place], [address] [address], [place], [place], [address] Phone: [phone]  Fax: [phone]  Primary Care Physician:  Dale [place], MD   Pre-Procedure History & Physical: HPI:  Kristine Hendricks is a 57 y.o. female is here for an endoscopy    Past Medical History:  Diagnosis Date   Anemia    Asthma    Basal cell carcinoma 04/09/2019   left lateral calf   GERD (gastroesophageal reflux disease)    History of chicken pox    Hypercholesterolemia    Hypertension    Hypothyroidism    Seasonal allergies    Sleep apnea     Past Surgical History:  Procedure Laterality Date   arthroscopic surgery     Dr Hyacinth Meeker   BREAST CYST ASPIRATION Right 2005   BREAST SURGERY  4 /05   biopsy   CHOLECYSTECTOMY  2007   COLONOSCOPY N/A 06/01/2022   Procedure: COLONOSCOPY;  Surgeon: Jaynie Collins, DO;  Location: [facility] ENDOSCOPY;  Service: Gastroenterology;  Laterality: N/A;   COLONOSCOPY WITH PROPOFOL N/A 02/14/2016   Procedure: COLONOSCOPY WITH PROPOFOL;  Surgeon: Scot Jun, MD;  Location: [facility] ENDOSCOPY;  Service: Endoscopy;  Laterality: N/A;   DILATION AND CURETTAGE OF UTERUS  8/04   LAPAROSCOPIC GASTRIC BANDING  01/29/09   MYOMECTOMY  9/04   VAGINAL HYSTERECTOMY  06/11/03    Prior to Admission medications   Medication Sig Start Date End Date Taking? Authorizing Provider  albuterol (VENTOLIN HFA) 108 (90 Base) MCG/ACT inhaler INHALE 2 PUFFS INTO THE LUNGS EVERY 6 HOURS AS NEEDED FOR WHEEZING OR SHORTNESS OF BREATH 10/14/21   Dale [place], MD  atorvastatin (LIPITOR) 10 MG tablet TAKE 1 TABLET BY MOUTH  DAILY 02/03/22   Dale [place], MD  CALCIUM PO Take 300 mg by mouth 4 (four) times daily.    [provider]  hydrochlorothiazide (MICROZIDE) 12.5 MG capsule TAKE 1 CAPSULE(12.5 MG) BY MOUTH DAILY 08/09/22   Dale [place], MD  levothyroxine (SYNTHROID) 75 MCG tablet TAKE 1 TABLET(75 MCG) BY  MOUTH DAILY 11/08/21   Worthy Rancher B, FNP  losartan (COZAAR) 100 MG tablet Take 1 tablet (100 mg total) by mouth daily. 08/09/22   Dale [place]bud, MD  Multiple Vitamin (MULTIVITAMIN PO) Take by mouth daily.    [provider]  pantoprazole (PROTONIX) 40 MG tablet TAKE 1 TABLET BY MOUTH  DAILY 02/03/22   Dale Heber, MD  venlafaxine XR (EFFEXOR-XR) 37.5 MG 24 hr capsule TAKE 1 CAPSULE BY MOUTH DAILY  WITH BREAKFAST 05/01/22   Dale , MD    Allergies as of 09/19/2022 - Review Complete 09/19/2022  Allergen Reaction Noted   Sulfa antibiotics Rash 11/25/2010   Sulfasalazine Rash 11/25/2010   Codeine Nausea And Vomiting 11/25/2010    Family History  Problem Relation Age of Onset   Lung cancer Mother    Arthritis Mother    Stroke Mother    Hypertension Mother    Colon polyps Mother    Thyroid disease Sister        graves disease   Colon cancer Other        maternal grandparent   Hyperlipidemia Father    Stroke Maternal Aunt    Stroke Maternal Uncle    Cancer Maternal Grandmother        colon   Alcohol abuse Cousin    Breast cancer Neg Hx    Heart disease Neg Hx  Social History   Socioeconomic History   Marital status: Single    Spouse name: Not on file   Number of children: 0   Years of education: Not on file   Highest education level: Bachelor's degree (e.g., BA, AB, BS)  Occupational History    Employer: [employer]  Tobacco Use   Smoking status: Never   Smokeless tobacco: Never  Vaping Use   Vaping status: Never Used  Substance and Sexual Activity   Alcohol use: No    Alcohol/week: 0.0 standard drinks of alcohol    Comment: 1 drink/month   Drug use: No   Sexual activity: Not on file  Other Topics Concern   Not on file  Social History Narrative   Not on file   Social Determinants of Health   Financial Resource Strain: Low Risk  (08/06/2022)   Overall Financial Resource Strain (CARDIA)    Difficulty of Paying Living Expenses: Not hard at  all  Food Insecurity: No Food Insecurity (08/06/2022)   Hunger Vital Sign    Worried About Running Out of Food in the Last Year: Never true    Ran Out of Food in the Last Year: Never true  Transportation Needs: No Transportation Needs (08/06/2022)   PRAPARE - Administrator, Civil Service (Medical): No    Lack of Transportation (Non-Medical): No  Physical Activity: Unknown (08/06/2022)   Exercise Vital Sign    Days of Exercise per Week: 4 days    Minutes of Exercise per Session: Not on file  Stress: No Stress Concern Present (08/06/2022)   [employer] of Occupational Health - Occupational Stress Questionnaire    Feeling of Stress : Not at all  Social Connections: Moderately Integrated (08/06/2022)   Social Connection and Isolation Panel [NHANES]    Frequency of Communication with Friends and Family: More than three times a week    Frequency of Social Gatherings with Friends and Family: Once a week    Attends Religious Services: More than 4 times per year    Active Member of [employer] or Organizations: No    Attends Engineer, structural: Not on file    Marital Status: Living with partner  Intimate Partner Violence: Not on file    Review of Systems: See HPI, otherwise negative ROS  Physical Exam: LMP 06/11/2003  General:   Alert,  pleasant and cooperative in NAD Head:  Normocephalic and atraumatic. Neck:  Supple; no masses or thyromegaly. Lungs:  Clear throughout to auscultation, normal respiratory effort.    Heart:  +S1, +S2, Regular rate and rhythm, No edema. Abdomen:  Soft, nontender and nondistended. Normal bowel sounds, without guarding, and without rebound.   Neurologic:  Alert and  oriented x4;  grossly normal neurologically.  Impression/Plan: Kristine Hendricks is here for an endoscopy  to be performed for  evaluation of abdominal pain    Risks, benefits, limitations, and alternatives regarding endoscopy have been reviewed with the patient.  Questions  have been answered.  All parties agreeable.   Wyline Mood, MD  11/01/2022, 9:18 AM

## 2022-11-01 NOTE — Transfer of Care (Signed)
Immediate Anesthesia Transfer of Care Note  Patient: LAKYIA FURMANSKI  Procedure(s) Performed: ESOPHAGOGASTRODUODENOSCOPY (EGD) WITH PROPOFOL BIOPSY POLYPECTOMY  Patient Location: Endoscopy Unit  Anesthesia Type:General  Level of Consciousness: drowsy and patient cooperative  Airway & Oxygen Therapy: Patient Spontanous Breathing and Patient connected to face mask oxygen  Post-op Assessment: Report given to RN and Post -op Vital signs reviewed and stable  Post vital signs: Reviewed and stable  Last Vitals:  Vitals Value Taken Time  BP 113/75 11/01/22 1024  Temp 35.1 C 11/01/22 1023  Pulse 69 11/01/22 1028  Resp 19 11/01/22 1028  SpO2 94 % 11/01/22 1028  Vitals shown include unfiled device data.  Last Pain:  Vitals:   11/01/22 1023  TempSrc: Temporal  PainSc: Asleep         Complications: No notable events documented.

## 2022-11-02 ENCOUNTER — Encounter (INDEPENDENT_AMBULATORY_CARE_PROVIDER_SITE_OTHER): Payer: Self-pay

## 2022-11-02 ENCOUNTER — Encounter: Payer: Self-pay | Admitting: Gastroenterology

## 2022-11-02 NOTE — Anesthesia Postprocedure Evaluation (Signed)
Anesthesia Post Note  Patient: Kristine Hendricks  Procedure(s) Performed: ESOPHAGOGASTRODUODENOSCOPY (EGD) WITH PROPOFOL BIOPSY POLYPECTOMY  Patient location during evaluation: PACU Anesthesia Type: General Level of consciousness: awake and alert Pain management: pain level controlled Vital Signs Assessment: post-procedure vital signs reviewed and stable Respiratory status: spontaneous breathing, nonlabored ventilation, respiratory function stable and patient connected to nasal cannula oxygen Cardiovascular status: blood pressure returned to baseline and stable Postop Assessment: no apparent nausea or vomiting Anesthetic complications: no   No notable events documented.   Last Vitals:  Vitals:   11/01/22 1035 11/01/22 1044  BP: 107/71 111/80  Pulse: 60 (!) 58  Resp: 15 18  Temp:    SpO2: 94% 94%    Last Pain:  Vitals:   11/02/22 0741  TempSrc:   PainSc: 0-No pain                 Yevette Edwards

## 2022-11-09 ENCOUNTER — Encounter: Payer: Self-pay | Admitting: Gastroenterology

## 2022-12-12 ENCOUNTER — Encounter: Payer: Managed Care, Other (non HMO) | Admitting: Internal Medicine

## 2022-12-18 ENCOUNTER — Encounter: Payer: Self-pay | Admitting: Internal Medicine

## 2022-12-18 ENCOUNTER — Other Ambulatory Visit: Payer: Self-pay

## 2022-12-18 MED ORDER — LEVOTHYROXINE SODIUM 75 MCG PO TABS: 75.0000 ug | ORAL_TABLET | Freq: Every day | ORAL | 2 refills | Status: DC

## 2023-01-02 ENCOUNTER — Ambulatory Visit: Payer: Managed Care, Other (non HMO) | Admitting: Internal Medicine

## 2023-01-02 ENCOUNTER — Encounter: Payer: Self-pay | Admitting: Internal Medicine

## 2023-01-02 VITALS — BP 120/82 | HR 62 | Temp 97.5°F | Ht 64.0 in | Wt 326.6 lb

## 2023-01-02 DIAGNOSIS — R739 Hyperglycemia, unspecified: Secondary | ICD-10-CM

## 2023-01-02 DIAGNOSIS — R1013 Epigastric pain: Secondary | ICD-10-CM

## 2023-01-02 DIAGNOSIS — E7800 Pure hypercholesterolemia, unspecified: Secondary | ICD-10-CM

## 2023-01-02 DIAGNOSIS — Z0000 Encounter for general adult medical examination without abnormal findings: Secondary | ICD-10-CM | POA: Diagnosis not present

## 2023-01-02 DIAGNOSIS — Z23 Encounter for immunization: Secondary | ICD-10-CM

## 2023-01-02 DIAGNOSIS — I10 Essential (primary) hypertension: Secondary | ICD-10-CM | POA: Diagnosis not present

## 2023-01-02 DIAGNOSIS — Z860100 Personal history of colon polyps, unspecified: Secondary | ICD-10-CM

## 2023-01-02 DIAGNOSIS — E039 Hypothyroidism, unspecified: Secondary | ICD-10-CM | POA: Diagnosis not present

## 2023-01-02 DIAGNOSIS — K2100 Gastro-esophageal reflux disease with esophagitis, without bleeding: Secondary | ICD-10-CM

## 2023-01-02 NOTE — Assessment & Plan Note (Signed)
Elevated sugar noticed on screening labs at work.  Check A1c.

## 2023-01-02 NOTE — Assessment & Plan Note (Signed)
Colonoscopy 06/01/22 - hyperplastic polyp (cecum)

## 2023-01-02 NOTE — Assessment & Plan Note (Signed)
On lipitor.  Low cholesterol diet and exercise.  Follow lipid panel and liver function tests.   

## 2023-01-02 NOTE — Assessment & Plan Note (Signed)
CT revealed gastric lap band to be in appropriate position. No acute findings. Saw GI 09/19/22.  Recommended continuing protonix and EGD. EGD (11/01/22) - revealed multiple gastric polyps with normal duodenum and esophagus. Pathology - gastritis. On protonix.  Feeling better.  No pain.  No acid reflux.  Has had one emesis episode since her EGD.  No further episodes.  Eating.  Swallowing ok.  No nausea or vomiting.

## 2023-01-02 NOTE — Assessment & Plan Note (Signed)
Saw GI 09/19/22.  Recommended continuing protonix and EGD. EGD (11/01/22) - revealed multiple gastric polyps with normal duodenum and esophagus. Pathology - gastritis. On protonix.  Feeling better.  No pain.  No acid reflux.

## 2023-01-02 NOTE — Assessment & Plan Note (Signed)
Reports no acid reflux.  On protonix.

## 2023-01-02 NOTE — Assessment & Plan Note (Signed)
On losartan.  Blood pressure as outlined.  Continue current medication regimen. Follow metabolic panel.

## 2023-01-02 NOTE — Progress Notes (Signed)
Subjective:    Patient ID: Kristine Hendricks, female    DOB: 04-06-1965, 57 y.o.   MRN: [id number]  Patient here for  Chief Complaint  Patient presents with   Annual Exam    HPI Here for a physical exam. Recently seen in ER - 08/28/22 - with left upper abdominal pain and vomiting. See last note for details. CT revealed gastric lap band to be in appropriate position. No acute findings. Saw GI 09/19/22.  Recommended continuing protonix and EGD. EGD (11/01/22) - revealed multiple gastric polyps with normal duodenum and esophagus. Pathology - gastritis. On protonix.  Feeling better.  No pain.  No acid reflux.  Has had one emesis episode since her EGD.  No further episodes.  Eating.  Swallowing ok.  No nausea or vomiting.  No chest pain or sob.  Bowels moving. Discussed exercise.  Has been walking.    Past Medical History:  Diagnosis Date   Anemia    Asthma    Basal cell carcinoma 04/09/2019   left lateral calf   GERD (gastroesophageal reflux disease)    History of chicken pox    Hypercholesterolemia    Hypertension    Hypothyroidism    Seasonal allergies    Sleep apnea    Past Surgical History:  Procedure Laterality Date   arthroscopic surgery     Dr Hyacinth Meeker   BIOPSY  11/01/2022   Procedure: BIOPSY;  Surgeon: Wyline Mood, MD;  Location: [facility] ENDOSCOPY;  Service: Gastroenterology;;   BREAST CYST ASPIRATION Right 2005   BREAST SURGERY  4 /05   biopsy   CHOLECYSTECTOMY  2007   COLONOSCOPY N/A 06/01/2022   Procedure: COLONOSCOPY;  Surgeon: Jaynie Collins, DO;  Location: [facility] ENDOSCOPY;  Service: Gastroenterology;  Laterality: N/A;   COLONOSCOPY WITH PROPOFOL N/A 02/14/2016   Procedure: COLONOSCOPY WITH PROPOFOL;  Surgeon: Scot Jun, MD;  Location: [facility] ENDOSCOPY;  Service: Endoscopy;  Laterality: N/A;   DILATION AND CURETTAGE OF UTERUS  8/04   ESOPHAGOGASTRODUODENOSCOPY (EGD) WITH PROPOFOL N/A 11/01/2022   Procedure: ESOPHAGOGASTRODUODENOSCOPY (EGD) WITH PROPOFOL;  Surgeon:  Wyline Mood, MD;  Location: [facility] ENDOSCOPY;  Service: Gastroenterology;  Laterality: N/A;   LAPAROSCOPIC GASTRIC BANDING  01/29/09   MYOMECTOMY  9/04   POLYPECTOMY  11/01/2022   Procedure: POLYPECTOMY;  Surgeon: Wyline Mood, MD;  Location: [facility] ENDOSCOPY;  Service: Gastroenterology;;   VAGINAL HYSTERECTOMY  06/11/03   Family History  Problem Relation Age of Onset   Lung cancer Mother    Arthritis Mother    Stroke Mother    Hypertension Mother    Colon polyps Mother    Thyroid disease Sister        graves disease   Colon cancer Other        maternal grandparent   Hyperlipidemia Father    Stroke Maternal Aunt    Stroke Maternal Uncle    Cancer Maternal Grandmother        colon   Alcohol abuse Cousin    Breast cancer Neg Hx    Heart disease Neg Hx    Social History   Socioeconomic History   Marital status: Single    Spouse name: Not on file   Number of children: 0   Years of education: Not on file   Highest education level: Bachelor's degree (e.g., BA, AB, BS)  Occupational History    Employer: [employer]  Tobacco Use   Smoking status: Never   Smokeless tobacco: Never  Vaping Use   Vaping  status: Never Used  Substance and Sexual Activity   Alcohol use: No    Alcohol/week: 0.0 standard drinks of alcohol    Comment: 1 drink/month   Drug use: No   Sexual activity: Not on file  Other Topics Concern   Not on file  Social History Narrative   Not on file   Social Determinants of Health   Financial Resource Strain: Low Risk  (01/01/2023)   Overall Financial Resource Strain (CARDIA)    Difficulty of Paying Living Expenses: Not hard at all  Food Insecurity: No Food Insecurity (01/01/2023)   Hunger Vital Sign    Worried About Running Out of Food in the Last Year: Never true    Ran Out of Food in the Last Year: Never true  Transportation Needs: No Transportation Needs (01/01/2023)   PRAPARE - Administrator, Civil Service (Medical): No    Lack of  Transportation (Non-Medical): No  Physical Activity: Insufficiently Active (01/01/2023)   Exercise Vital Sign    Days of Exercise per Week: 3 days    Minutes of Exercise per Session: 30 min  Stress: No Stress Concern Present (01/01/2023)   [employer] of Occupational Health - Occupational Stress Questionnaire    Feeling of Stress : Not at all  Social Connections: Socially Integrated (01/01/2023)   Social Connection and Isolation Panel [NHANES]    Frequency of Communication with Friends and Family: More than three times a week    Frequency of Social Gatherings with Friends and Family: Once a week    Attends Religious Services: More than 4 times per year    Active Member of [employer] or Organizations: Yes    Attends Engineer, structural: More than 4 times per year    Marital Status: Living with partner     Review of Systems  Constitutional:  Negative for appetite change and unexpected weight change.  HENT:  Negative for congestion and sinus pressure.   Respiratory:  Negative for cough, chest tightness and shortness of breath.   Cardiovascular:  Negative for chest pain, palpitations and leg swelling.  Gastrointestinal:  Negative for abdominal pain, diarrhea, nausea and vomiting.  Genitourinary:  Negative for difficulty urinating and dysuria.  Musculoskeletal:  Negative for joint swelling and myalgias.  Skin:  Negative for color change and rash.  Neurological:  Negative for dizziness and headaches.  Psychiatric/Behavioral:  Negative for agitation and dysphoric mood.        Objective:     BP 120/82   Pulse 62   Temp (!) 97.5 F (36.4 C) (Oral)   Ht 5\' 4"  (1.626 m)   Wt (!) 326 lb 9.6 oz (148.1 kg)   LMP 06/11/2003   SpO2 98%   BMI 56.06 kg/m  Wt Readings from Last 3 Encounters:  01/02/23 (!) 326 lb 9.6 oz (148.1 kg)  11/01/22 (!) 331 lb 6.4 oz (150.3 kg)  09/19/22 (!) 330 lb 9.6 oz (150 kg)    Physical Exam Vitals reviewed.  Constitutional:      General:  She is not in acute distress.    Appearance: Normal appearance. She is well-developed.  HENT:     Head: Normocephalic and atraumatic.     Right Ear: External ear normal.     Left Ear: External ear normal.  Eyes:     General: No scleral icterus.       Right eye: No discharge.        Left eye: No discharge.  Conjunctiva/sclera: Conjunctivae normal.  Neck:     Thyroid: No thyromegaly.  Cardiovascular:     Rate and Rhythm: Normal rate and regular rhythm.  Pulmonary:     Effort: No tachypnea, accessory muscle usage or respiratory distress.     Breath sounds: Normal breath sounds. No decreased breath sounds or wheezing.  Chest:  Breasts:    Right: No inverted nipple, mass, nipple discharge or tenderness (no axillary adenopathy).     Left: No inverted nipple, mass, nipple discharge or tenderness (no axilarry adenopathy).  Abdominal:     General: Bowel sounds are normal.     Palpations: Abdomen is soft.     Tenderness: There is no abdominal tenderness.  Musculoskeletal:        General: No swelling or tenderness.     Cervical back: Neck supple. No tenderness.  Lymphadenopathy:     Cervical: No cervical adenopathy.  Skin:    Findings: No erythema or rash.  Neurological:     Mental Status: She is alert and oriented to person, place, and time.  Psychiatric:        Mood and Affect: Mood normal.        Behavior: Behavior normal.      Outpatient Encounter Medications as of 01/02/2023  Medication Sig   albuterol (VENTOLIN HFA) 108 (90 Base) MCG/ACT inhaler INHALE 2 PUFFS INTO THE LUNGS EVERY 6 HOURS AS NEEDED FOR WHEEZING OR SHORTNESS OF BREATH   atorvastatin (LIPITOR) 10 MG tablet TAKE 1 TABLET BY MOUTH  DAILY   CALCIUM PO Take 300 mg by mouth 4 (four) times daily.   hydrochlorothiazide (MICROZIDE) 12.5 MG capsule TAKE 1 CAPSULE(12.5 MG) BY MOUTH DAILY   levothyroxine (SYNTHROID) 75 MCG tablet Take 1 tablet (75 mcg total) by mouth daily before breakfast.   losartan (COZAAR) 100  MG tablet Take 1 tablet (100 mg total) by mouth daily.   Multiple Vitamin (MULTIVITAMIN PO) Take by mouth daily.   pantoprazole (PROTONIX) 40 MG tablet TAKE 1 TABLET BY MOUTH  DAILY   venlafaxine XR (EFFEXOR-XR) 37.5 MG 24 hr capsule TAKE 1 CAPSULE BY MOUTH DAILY  WITH BREAKFAST   No facility-administered encounter medications on file as of 01/02/2023.     Lab Results  Component Value Date   WBC 9.7 08/28/2022   HGB 12.0 08/28/2022   HCT 38.8 08/28/2022   PLT 315 08/28/2022   GLUCOSE 98 08/28/2022   CHOL 172 04/19/2022   TRIG 78 04/19/2022   HDL 50 04/19/2022   LDLCALC 107 (H) 04/19/2022   ALT 15 08/28/2022   AST 21 08/28/2022   NA 139 08/28/2022   K 3.8 08/28/2022   CL 102 08/28/2022   CREATININE 0.79 08/28/2022   BUN 17 08/28/2022   CO2 26 08/28/2022   TSH 0.751 11/14/2021   HGBA1C 5.3 03/19/2015       Assessment & Plan:  Routine general medical examination at a health care facility  Health care maintenance Assessment & Plan: Physical today 01/02/23  Colonoscopy 06/01/22 - non bleeding hemorrhoids, tortuous colon, hyperplastic polyp (cecum)  Recommended f/u in 5 years. S/p hysterectomy.  Mammogram 03/27/22 - Birads I.    Hypothyroidism, unspecified type Assessment & Plan: On thyroid replacement.  Follow tsh.    Primary hypertension Assessment & Plan: On losartan.  Blood pressure as outlined.  Continue current medication regimen. Follow metabolic panel.   Orders: -     CBC with Differential/Platelet  Hypercholesterolemia Assessment & Plan: On lipitor.  Low cholesterol diet and exercise.  Follow lipid panel and liver function tests.     History of colonic polyps Assessment & Plan: Colonoscopy 06/01/22 - hyperplastic polyp (cecum)   Gastroesophageal reflux disease with esophagitis without hemorrhage Assessment & Plan: Reports no acid reflux.  On protonix.    Hyperglycemia Assessment & Plan: Elevated sugar noticed on screening labs at work.  Check A1c.    Orders: -     Hemoglobin A1c  Epigastric pain Assessment & Plan: CT revealed gastric lap band to be in appropriate position. No acute findings. Saw GI 09/19/22.  Recommended continuing protonix and EGD. EGD (11/01/22) - revealed multiple gastric polyps with normal duodenum and esophagus. Pathology - gastritis. On protonix.  Feeling better.  No pain.  No acid reflux.  Has had one emesis episode since her EGD.  No further episodes.  Eating.  Swallowing ok.  No nausea or vomiting.   Need for influenza vaccination -     Flu vaccine trivalent PF, 6mos and older(Flulaval,Afluria,Fluarix,Fluzone)     Dale Stigler, MD

## 2023-01-02 NOTE — Assessment & Plan Note (Addendum)
Physical today 01/02/23  Colonoscopy 06/01/22 - non bleeding hemorrhoids, tortuous colon, hyperplastic polyp (cecum)  Recommended f/u in 5 years. S/p hysterectomy.  Mammogram 03/27/22 - Birads I.

## 2023-01-02 NOTE — Assessment & Plan Note (Signed)
On thyroid replacement.  Follow tsh.  

## 2023-01-13 LAB — HEMOGLOBIN A1C
Est. average glucose Bld gHb Est-mCnc: 114 mg/dL
Hgb A1c MFr Bld: 5.6 % (ref 4.8–5.6)

## 2023-01-13 LAB — CBC WITH DIFFERENTIAL/PLATELET
Basophils Absolute: 0.1 10*3/uL (ref 0.0–0.2)
Basos: 1 %
EOS (ABSOLUTE): 0.2 10*3/uL (ref 0.0–0.4)
Eos: 2 %
Hematocrit: 38.8 % (ref 34.0–46.6)
Hemoglobin: 11.7 g/dL (ref 11.1–15.9)
Immature Grans (Abs): 0 10*3/uL (ref 0.0–0.1)
Immature Granulocytes: 0 %
Lymphocytes Absolute: 2.1 10*3/uL (ref 0.7–3.1)
Lymphs: 20 %
MCH: 24.5 pg — ABNORMAL LOW (ref 26.6–33.0)
MCHC: 30.2 g/dL — ABNORMAL LOW (ref 31.5–35.7)
MCV: 81 fL (ref 79–97)
Monocytes Absolute: 0.7 10*3/uL (ref 0.1–0.9)
Monocytes: 7 %
Neutrophils Absolute: 7.3 10*3/uL — ABNORMAL HIGH (ref 1.4–7.0)
Neutrophils: 70 %
Platelets: 331 10*3/uL (ref 150–450)
RBC: 4.77 x10E6/uL (ref 3.77–5.28)
RDW: 15.2 % (ref 11.7–15.4)
WBC: 10.4 10*3/uL (ref 3.4–10.8)

## 2023-01-16 ENCOUNTER — Encounter: Payer: Self-pay | Admitting: Internal Medicine

## 2023-01-17 ENCOUNTER — Other Ambulatory Visit: Payer: Self-pay

## 2023-01-17 ENCOUNTER — Telehealth: Payer: Self-pay

## 2023-01-17 DIAGNOSIS — R7989 Other specified abnormal findings of blood chemistry: Secondary | ICD-10-CM

## 2023-01-17 NOTE — Telephone Encounter (Signed)
Iron studies ordered to [employer] [facility]

## 2023-01-17 NOTE — Addendum Note (Signed)
Addended by: Rita Ohara D on: 01/17/2023 10:55 AM   Modules accepted: Orders

## 2023-01-18 ENCOUNTER — Other Ambulatory Visit: Payer: Self-pay | Admitting: Internal Medicine

## 2023-01-19 LAB — LIPID PANEL
Chol/HDL Ratio: 3.6 ratio (ref 0.0–4.4)
Cholesterol, Total: 157 mg/dL (ref 100–199)
HDL: 44 mg/dL (ref >39)
LDL Chol Calc (NIH): 89 mg/dL (ref 0–99)
Triglycerides: 135 mg/dL (ref 0–149)
VLDL Cholesterol Cal: 24 mg/dL (ref 5–40)

## 2023-01-19 LAB — HEPATIC FUNCTION PANEL
ALT: 12 [IU]/L (ref 0–32)
AST: 19 [IU]/L (ref 0–40)
Albumin: 4.1 g/dL (ref 3.8–4.9)
Alkaline Phosphatase: 111 [IU]/L (ref 44–121)
Bilirubin Total: 0.4 mg/dL (ref 0.0–1.2)
Bilirubin, Direct: 0.12 mg/dL (ref 0.00–0.40)
Total Protein: 6.2 g/dL (ref 6.0–8.5)

## 2023-01-19 LAB — BASIC METABOLIC PANEL
BUN/Creatinine Ratio: 11 (ref 9–23)
BUN: 11 mg/dL (ref 6–24)
CO2: 25 mmol/L (ref 20–29)
Calcium: 9.4 mg/dL (ref 8.7–10.2)
Chloride: 102 mmol/L (ref 96–106)
Creatinine, Ser: 0.98 mg/dL (ref 0.57–1.00)
Glucose: 85 mg/dL (ref 70–99)
Potassium: 4.3 mmol/L (ref 3.5–5.2)
Sodium: 141 mmol/L (ref 134–144)
eGFR: 67 mL/min/{1.73_m2} (ref >59)

## 2023-01-19 LAB — IRON,TIBC AND FERRITIN PANEL
Ferritin: 12 ng/mL — ABNORMAL LOW (ref 15–150)
Iron Saturation: 11 % — ABNORMAL LOW (ref 15–55)
Iron: 34 ug/dL (ref 27–159)
Total Iron Binding Capacity: 301 ug/dL (ref 250–450)
UIBC: 267 ug/dL (ref 131–425)

## 2023-01-19 LAB — TSH: TSH: 4.07 u[IU]/mL (ref 0.450–4.500)

## 2023-01-30 ENCOUNTER — Ambulatory Visit
Admission: RE | Admit: 2023-01-30 | Discharge: 2023-01-30 | Disposition: A | Discharge status: Home / Self Care | Payer: Managed Care, Other (non HMO) | Source: Ambulatory Visit

## 2023-01-30 VITALS — BP 138/80 | HR 75 | Temp 98.2°F | Resp 18

## 2023-01-30 DIAGNOSIS — L03011 Cellulitis of right finger: Secondary | ICD-10-CM

## 2023-01-30 MED ORDER — CEPHALEXIN 500 MG PO CAPS
500.0000 mg | ORAL_CAPSULE | Freq: Four times a day (QID) | ORAL | 0 refills | Status: DC
Start: 2023-01-30 — End: 2023-08-06

## 2023-01-30 NOTE — ED Triage Notes (Signed)
Patient to Urgent Care with complaints of right sided middle finger pain. Reports pain is concentrated around her nail and finger tip.  No drainage.

## 2023-01-30 NOTE — Discharge Instructions (Addendum)
Take the cephalexin as directed.  Follow-up with your primary care provider tomorrow.  Go to the emergency department if you have worsening symptoms.

## 2023-01-30 NOTE — ED Provider Notes (Signed)
Renaldo Fiddler    CSN: [id number] Arrival date & time: 01/30/23  1844      History   Chief Complaint Chief Complaint  Patient presents with   Finger Injury    Infection under nail, painful to touch swollen and red - Entered by patient    HPI Kristine Hendricks is a 57 y.o. female.  Patient presents with 1 week history of tender, red, swollen right middle fingertip.  Her symptoms started after she got a Forensic scientist.  No open wounds or drainage.  No fever, chills, numbness, weakness, or other symptoms.  No treatments at home.  Her medical history includes hypertension, hyperglycemia, hypercholesterolemia.  The history is provided by the patient and medical records.    Past Medical History:  Diagnosis Date   Anemia    Asthma    Basal cell carcinoma 04/09/2019   left lateral calf   GERD (gastroesophageal reflux disease)    History of chicken pox    Hypercholesterolemia    Hypertension    Hypothyroidism    Seasonal allergies    Sleep apnea     Patient Active Problem List   Diagnosis Date Noted   Hyperglycemia 01/02/2023   Abdominal pain, epigastric 11/01/2022   Gastric polyp 11/01/2022   Abdominal pain 09/03/2022   LUQ pain 08/14/2022   Encounter for screening for coronary artery disease 04/16/2022   Leukocytosis 04/10/2022   Eustachian tube dysfunction 11/12/2021   Chronic sinusitis 11/12/2021   Abdominal fullness 09/23/2021   Cough 07/14/2021   Abnormal mammogram 05/08/2021   Hypothyroid 07/12/2018   Palpitations 12/13/2017   PAC (premature atrial contraction) 12/13/2017   Rapid heart beat 11/25/2017   Back pain 05/24/2017   Ear fullness 12/09/2016   Elevated TSH 12/09/2016   History of colonic polyps 02/17/2016   Rash 07/03/2015   Health care maintenance 04/01/2015   Subacute cutaneous lupus erythematosus 05/25/2014   Malaise 04/29/2013   Headache 11/06/2012   Menopausal syndrome 06/09/2012   Symptomatic menopausal or female climacteric states  06/09/2012   Sleep apnea 02/17/2012   GERD (gastroesophageal reflux disease) 02/17/2012   Hypertension 02/12/2012   Hypercholesterolemia 02/12/2012    Past Surgical History:  Procedure Laterality Date   arthroscopic surgery     Dr Hyacinth Meeker   BIOPSY  11/01/2022   Procedure: BIOPSY;  Surgeon: Wyline Mood, MD;  Location: [facility] ENDOSCOPY;  Service: Gastroenterology;;   BREAST CYST ASPIRATION Right 2005   BREAST SURGERY  4 /05   biopsy   CHOLECYSTECTOMY  2007   COLONOSCOPY N/A 06/01/2022   Procedure: COLONOSCOPY;  Surgeon: Jaynie Collins, DO;  Location: [facility] ENDOSCOPY;  Service: Gastroenterology;  Laterality: N/A;   COLONOSCOPY WITH PROPOFOL N/A 02/14/2016   Procedure: COLONOSCOPY WITH PROPOFOL;  Surgeon: Scot Jun, MD;  Location: [facility] ENDOSCOPY;  Service: Endoscopy;  Laterality: N/A;   DILATION AND CURETTAGE OF UTERUS  8/04   ESOPHAGOGASTRODUODENOSCOPY (EGD) WITH PROPOFOL N/A 11/01/2022   Procedure: ESOPHAGOGASTRODUODENOSCOPY (EGD) WITH PROPOFOL;  Surgeon: Wyline Mood, MD;  Location: [facility] ENDOSCOPY;  Service: Gastroenterology;  Laterality: N/A;   LAPAROSCOPIC GASTRIC BANDING  01/29/09   MYOMECTOMY  9/04   POLYPECTOMY  11/01/2022   Procedure: POLYPECTOMY;  Surgeon: Wyline Mood, MD;  Location: [facility] ENDOSCOPY;  Service: Gastroenterology;;   VAGINAL HYSTERECTOMY  06/11/03    OB History   No obstetric history on file.      Home Medications    Prior to Admission medications   Medication Sig Start Date End Date Taking? Authorizing Provider  cephALEXin (KEFLEX) 500 MG capsule Take 1 capsule (500 mg total) by mouth 4 (four) times daily. 01/30/23  Yes Mickie Bail, NP  ZEPBOUND 5 MG/0.5ML Pen SMARTSIG:5 Milligram(s) SUB-Q Once a Week 01/12/23  Yes [provider]  albuterol (VENTOLIN HFA) 108 (90 Base) MCG/ACT inhaler INHALE 2 PUFFS INTO THE LUNGS EVERY 6 HOURS AS NEEDED FOR WHEEZING OR SHORTNESS OF BREATH 10/14/21   Dale [place], MD  atorvastatin (LIPITOR) 10 MG  tablet TAKE 1 TABLET BY MOUTH DAILY 01/19/23   Dale [place], MD  CALCIUM PO Take 300 mg by mouth 4 (four) times daily.    [provider]  hydrochlorothiazide (MICROZIDE) 12.5 MG capsule TAKE 1 CAPSULE(12.5 MG) BY MOUTH DAILY 08/09/22   Dale [place], MD  levothyroxine (SYNTHROID) 75 MCG tablet Take 1 tablet (75 mcg total) by mouth daily before breakfast. 12/18/22   Dale [place], MD  losartan (COZAAR) 100 MG tablet Take 1 tablet (100 mg total) by mouth daily. 08/09/22   Dale [place], MD  Multiple Vitamin (MULTIVITAMIN PO) Take by mouth daily.    [provider]  pantoprazole (PROTONIX) 40 MG tablet TAKE 1 TABLET BY MOUTH DAILY 01/19/23   Dale [place], MD  venlafaxine XR (EFFEXOR-XR) 37.5 MG 24 hr capsule TAKE 1 CAPSULE BY MOUTH DAILY  WITH BREAKFAST 05/01/22   Dale [place], MD    Family History Family History  Problem Relation Age of Onset   Lung cancer Mother    Arthritis Mother    Stroke Mother    Hypertension Mother    Colon polyps Mother    Thyroid disease Sister        graves disease   Colon cancer Other        maternal grandparent   Hyperlipidemia Father    Stroke Maternal Aunt    Stroke Maternal Uncle    Cancer Maternal Grandmother        colon   Alcohol abuse Cousin    Breast cancer Neg Hx    Heart disease Neg Hx     Social History Social History   Tobacco Use   Smoking status: Never   Smokeless tobacco: Never  Vaping Use   Vaping status: Never Used  Substance Use Topics   Alcohol use: No    Alcohol/week: 0.0 standard drinks of alcohol    Comment: 1 drink/month   Drug use: No     Allergies   Sulfa antibiotics, Sulfasalazine, and Codeine   Review of Systems Review of Systems  Constitutional:  Negative for chills and fever.  Musculoskeletal:  Positive for arthralgias and joint swelling.  Skin:  Positive for color change. Negative for wound.  Neurological:  Negative for weakness and numbness.     Physical Exam Triage  Vital Signs ED Triage Vitals  Encounter Vitals Group     BP --      Systolic BP Percentile --      Diastolic BP Percentile --      Pulse Rate 01/30/23 1858 75     Resp 01/30/23 1858 18     Temp 01/30/23 1858 98.2 F (36.8 C)     Temp src --      SpO2 01/30/23 1858 98 %     Weight --      Height --      Head Circumference --      Peak Flow --      Pain Score 01/30/23 1911 2     Pain Loc --  Pain Education --      Exclude from Growth Chart --    No data found.  Updated Vital Signs BP 138/80   Pulse 75   Temp 98.2 F (36.8 C)   Resp 18   LMP 06/11/2003   SpO2 98%   Visual Acuity Right Eye Distance:   Left Eye Distance:   Bilateral Distance:    Right Eye Near:   Left Eye Near:    Bilateral Near:     Physical Exam Constitutional:      General: She is not in acute distress. HENT:     Mouth/Throat:     Mouth: Mucous membranes are moist.  Cardiovascular:     Rate and Rhythm: Normal rate and regular rhythm.  Pulmonary:     Effort: Pulmonary effort is normal. No respiratory distress.  Musculoskeletal:        General: Tenderness present. No deformity. Normal range of motion.  Skin:    General: Skin is warm and dry.     Findings: Erythema present. No lesion.     Comments: Pad of right middle finger tender, erythematous, mildly edematous from just below the PIP to tip.  No wounds or drainage.  No indication of paronychia.  This does not appear to be a felon at this time.  See pictures for details.  Neurological:     General: No focal deficit present.     Mental Status: She is alert and oriented to person, place, and time.     Sensory: No sensory deficit.     Motor: No weakness.  Psychiatric:        Mood and Affect: Mood normal.        Behavior: Behavior normal.         UC Treatments / Results  Labs (all labs ordered are listed, but only abnormal results are displayed) Labs Reviewed - No data to display  EKG   Radiology No results  found.  Procedures Procedures (including critical care time)  Medications Ordered in UC Medications - No data to display  Initial Impression / Assessment and Plan / UC Course  I have reviewed the triage vital signs and the nursing notes.  Pertinent labs & imaging results that were available during my care of the patient were reviewed by me and considered in my medical decision making (see chart for details).    Cellulitis of right middle finger.  Afebrile and vital signs are stable.  Right middle finger pad is tender and erythematous but does not appear to be a felon at this time.  Treating with cephalexin.  Discussed finger felon with patient at length.  Strict ED precautions given.  Instructed her to follow-up with her PCP tomorrow.  Education provided on fingertip infection.  Patient agrees to plan of care.  Final Clinical Impressions(s) / UC Diagnoses   Final diagnoses:  Cellulitis of right middle finger     Discharge Instructions      Take the cephalexin as directed.  Follow-up with your primary care provider tomorrow.  Go to the emergency department if you have worsening symptoms.     ED Prescriptions     Medication Sig Dispense Auth. Provider   cephALEXin (KEFLEX) 500 MG capsule Take 1 capsule (500 mg total) by mouth 4 (four) times daily. 28 capsule Mickie Bail, NP      PDMP not reviewed this encounter.   Mickie Bail, NP 01/30/23 [phone]

## 2023-01-31 ENCOUNTER — Emergency Department
Admission: EM | Admit: 2023-01-31 | Discharge: 2023-01-31 | Disposition: A | Discharge status: Home / Self Care | Payer: Managed Care, Other (non HMO) | Attending: Emergency Medicine | Admitting: Emergency Medicine

## 2023-01-31 ENCOUNTER — Other Ambulatory Visit: Payer: Self-pay

## 2023-01-31 ENCOUNTER — Encounter: Payer: Self-pay | Admitting: Emergency Medicine

## 2023-01-31 DIAGNOSIS — L03011 Cellulitis of right finger: Secondary | ICD-10-CM | POA: Diagnosis not present

## 2023-01-31 DIAGNOSIS — I10 Essential (primary) hypertension: Secondary | ICD-10-CM | POA: Insufficient documentation

## 2023-01-31 DIAGNOSIS — M7989 Other specified soft tissue disorders: Secondary | ICD-10-CM | POA: Diagnosis present

## 2023-01-31 NOTE — ED Provider Notes (Signed)
[facility] Provider Note    Event Date/Time   First MD Initiated Contact with Patient 01/31/23 [phone]     (approximate)   History   Finger Swelling   HPI  Kristine Hendricks is a 57 y.o. female history of hypertension hypercholesterolemia   Since about Thursday patient has noticed some tenderness and slight redness at the tip of her right middle finger.  When she reports she went to urgent care yesterday and they started her on antibiotic.  They told her that if it look any worse that she should get it looked at at the emergency room.  She reports that got a little bit worse and that she saw a tiny little pustule or slight increase in redness just underneath the nail today.  She reports that overall though the finger is just slightly swollen.  It is tender to touch things with.  No fevers.  No nausea or vomiting    Physical Exam   Triage Vital Signs: ED Triage Vitals  Encounter Vitals Group     BP 01/31/23 0704 116/77     Systolic BP Percentile --      Diastolic BP Percentile --      Pulse Rate 01/31/23 0704 64     Resp 01/31/23 0704 18     Temp 01/31/23 0704 98 F (36.7 C)     Temp Source 01/31/23 0704 Oral     SpO2 01/31/23 0704 96 %     Weight 01/31/23 0703 (!) 315 lb (142.9 kg)     Height 01/31/23 0703 5\' 4"  (1.626 m)     Head Circumference --      Peak Flow --      Pain Score 01/31/23 0703 3     Pain Loc --      Pain Education --      Exclude from Growth Chart --     Most recent vital signs: Vitals:   01/31/23 0704  BP: 116/77  Pulse: 64  Resp: 18  Temp: 98 F (36.7 C)  SpO2: 96%     General: Awake, no distress.  CV:  Good peripheral perfusion.  Resp:  Normal effort.  Abd:  No distention.  Other:  Right hand warm well-perfused in all digits.  She demonstrates normal use of the right hand.  The distal portion of the right middle finger has a mild erythema and slight induration to it primarily at the tip of the finger.  There  does not appear to be any deep pulp space infection, there is no felon present.  There viral lesions such of herpetic whitlow.  There is no paronychia.  The nailbed is normal in appearance.  She has a very small amount of perhaps a very early pustule at the distal tip of the right fingernail.  The digit is normal proximally.  There is no involvement of the distal IP joint.  There is no sausage digit.   ED Results / Procedures / Treatments   Labs (all labs ordered are listed, but only abnormal results are displayed) Labs Reviewed - No data to display   EKG     RADIOLOGY     PROCEDURES:  Critical Care performed: No  Procedures   MEDICATIONS ORDERED IN ED: Medications - No data to display   IMPRESSION / MDM / ASSESSMENT AND PLAN / ED COURSE  I reviewed the triage vital signs and the nursing notes.  Differential diagnosis includes, but is not limited to, cellulitis of the right middle finger, herpetic whitlow, felon, abscess, paronychia etc.  Based on her clinical examination she appears to have a very mild cellulitis at the tip of the right middle finger.  It does not have associated complication or indication that would suggest need for surgical procedure or I&D.  There is no findings of tenosynovitis.  Discussed with the patient, and she will continue to monitor her symptoms.  I do think that it is possible she could develop a felon, but at this point she certainly does not have 1.  She will continue with antibiotics and monitor.  If symptoms are not improving or if she sees significant worsening she will return to the emergency department.  Return precautions and treatment recommendations and follow-up discussed with the patient who is agreeable with the plan.   Patient's presentation is most consistent with acute, uncomplicated illness.          FINAL CLINICAL IMPRESSION(S) / ED DIAGNOSES   Final diagnoses:  Cellulitis of right  middle finger     Rx / DC Orders   ED Discharge Orders     None        Note:  This document was prepared using Dragon voice recognition software and may include unintentional dictation errors.   Sharyn Creamer, MD 01/31/23 [phone]

## 2023-01-31 NOTE — ED Triage Notes (Signed)
Pt via POV from home. Pt c/o R middle finger infection. Reports she was seen at UC last night and prescribed abx which she has only taken 1 dose of. Reports that the swelling and pain got worse so she came. Minimal swelling and redness noted to the R middle finger. Pt is A&Ox4 and NAD

## 2023-01-31 NOTE — Discharge Instructions (Signed)
Please continue your current antibiotic and monitor for signs of improvement or worsening.  If you have worsening symptoms such as a very swollen finger pad, extending redness, pain over the joints, difficulty bending the finger or other concerns arise please return to the ER right away.

## 2023-02-07 ENCOUNTER — Other Ambulatory Visit: Payer: Self-pay

## 2023-02-12 ENCOUNTER — Encounter: Payer: Self-pay | Admitting: Gastroenterology

## 2023-02-19 ENCOUNTER — Ambulatory Visit
Admission: RE | Admit: 2023-02-19 | Discharge: 2023-02-19 | Disposition: A | Discharge status: Home / Self Care | Payer: Managed Care, Other (non HMO) | Source: Ambulatory Visit | Attending: Gastroenterology | Admitting: Gastroenterology

## 2023-02-19 ENCOUNTER — Encounter
Admission: RE | Disposition: A | Discharge status: Home / Self Care | Payer: Self-pay | Source: Ambulatory Visit | Attending: Gastroenterology

## 2023-02-19 DIAGNOSIS — D509 Iron deficiency anemia, unspecified: Secondary | ICD-10-CM | POA: Diagnosis present

## 2023-02-19 HISTORY — PX: GIVENS CAPSULE STUDY: SHX5432

## 2023-02-19 SURGERY — GIVENS CAPSULE STUDY

## 2023-02-20 ENCOUNTER — Encounter: Payer: Self-pay | Admitting: Gastroenterology

## 2023-04-05 ENCOUNTER — Encounter: Payer: Self-pay | Admitting: Internal Medicine

## 2023-04-05 DIAGNOSIS — E611 Iron deficiency: Secondary | ICD-10-CM

## 2023-04-06 NOTE — Telephone Encounter (Signed)
Ok to order iron studies for her? She does [facility]

## 2023-04-09 NOTE — Telephone Encounter (Signed)
Patient would like these labs ordered. I could not remember which iron studies you order for [facility].

## 2023-04-09 NOTE — Telephone Encounter (Signed)
Orders placed for labs to be drawn at [facility]. Pt notified via my chart.

## 2023-04-13 ENCOUNTER — Other Ambulatory Visit: Payer: Self-pay | Admitting: Internal Medicine

## 2023-04-13 DIAGNOSIS — Z1231 Encounter for screening mammogram for malignant neoplasm of breast: Secondary | ICD-10-CM

## 2023-04-14 LAB — IRON,TIBC AND FERRITIN PANEL
Ferritin: 9 ng/mL — ABNORMAL LOW (ref 15–150)
Iron Saturation: 6 % — CL (ref 15–55)
Iron: 20 ug/dL — ABNORMAL LOW (ref 27–159)
Total Iron Binding Capacity: 322 ug/dL (ref 250–450)
UIBC: 302 ug/dL (ref 131–425)

## 2023-04-14 LAB — CBC WITH DIFFERENTIAL/PLATELET
Basophils Absolute: 0.1 10*3/uL (ref 0.0–0.2)
Basos: 1 %
EOS (ABSOLUTE): 0.1 10*3/uL (ref 0.0–0.4)
Eos: 1 %
Hematocrit: 35.5 % (ref 34.0–46.6)
Hemoglobin: 10.8 g/dL — ABNORMAL LOW (ref 11.1–15.9)
Immature Grans (Abs): 0 10*3/uL (ref 0.0–0.1)
Immature Granulocytes: 0 %
Lymphocytes Absolute: 2.1 10*3/uL (ref 0.7–3.1)
Lymphs: 22 %
MCH: 23.4 pg — ABNORMAL LOW (ref 26.6–33.0)
MCHC: 30.4 g/dL — ABNORMAL LOW (ref 31.5–35.7)
MCV: 77 fL — ABNORMAL LOW (ref 79–97)
Monocytes Absolute: 0.8 10*3/uL (ref 0.1–0.9)
Monocytes: 8 %
Neutrophils Absolute: 6.5 10*3/uL (ref 1.4–7.0)
Neutrophils: 68 %
Platelets: 398 10*3/uL (ref 150–450)
RBC: 4.61 x10E6/uL (ref 3.77–5.28)
RDW: 14.4 % (ref 11.7–15.4)
WBC: 9.6 10*3/uL (ref 3.4–10.8)

## 2023-04-14 LAB — BASIC METABOLIC PANEL
BUN/Creatinine Ratio: 16 (ref 9–23)
BUN: 15 mg/dL (ref 6–24)
CO2: 23 mmol/L (ref 20–29)
Calcium: 9.3 mg/dL (ref 8.7–10.2)
Chloride: 102 mmol/L (ref 96–106)
Creatinine, Ser: 0.91 mg/dL (ref 0.57–1.00)
Glucose: 90 mg/dL (ref 70–99)
Potassium: 4.2 mmol/L (ref 3.5–5.2)
Sodium: 143 mmol/L (ref 134–144)
eGFR: 73 mL/min/{1.73_m2} (ref >59)

## 2023-04-18 ENCOUNTER — Ambulatory Visit
Admission: RE | Admit: 2023-04-18 | Discharge: 2023-04-18 | Disposition: A | Discharge status: Home / Self Care | Payer: Managed Care, Other (non HMO) | Source: Ambulatory Visit | Attending: Internal Medicine | Admitting: Internal Medicine

## 2023-04-18 ENCOUNTER — Telehealth: Payer: Self-pay | Admitting: Internal Medicine

## 2023-04-18 DIAGNOSIS — Z1231 Encounter for screening mammogram for malignant neoplasm of breast: Secondary | ICD-10-CM | POA: Diagnosis present

## 2023-04-18 DIAGNOSIS — D509 Iron deficiency anemia, unspecified: Secondary | ICD-10-CM

## 2023-04-18 NOTE — Telephone Encounter (Signed)
Order placed for f/u [facility].

## 2023-05-14 ENCOUNTER — Ambulatory Visit: Payer: Managed Care, Other (non HMO) | Admitting: Internal Medicine

## 2023-05-28 ENCOUNTER — Other Ambulatory Visit: Payer: Self-pay | Admitting: Internal Medicine

## 2023-06-16 ENCOUNTER — Other Ambulatory Visit: Payer: Self-pay | Admitting: Internal Medicine

## 2023-07-10 ENCOUNTER — Ambulatory Visit: Payer: Managed Care, Other (non HMO) | Admitting: Internal Medicine

## 2023-07-29 ENCOUNTER — Other Ambulatory Visit: Payer: Self-pay | Admitting: Internal Medicine

## 2023-07-30 ENCOUNTER — Other Ambulatory Visit: Payer: Self-pay | Admitting: Internal Medicine

## 2023-08-06 ENCOUNTER — Ambulatory Visit: Payer: Managed Care, Other (non HMO) | Admitting: Dermatology

## 2023-08-06 ENCOUNTER — Encounter: Payer: Self-pay | Admitting: Dermatology

## 2023-08-06 DIAGNOSIS — D1801 Hemangioma of skin and subcutaneous tissue: Secondary | ICD-10-CM

## 2023-08-06 DIAGNOSIS — Z1283 Encounter for screening for malignant neoplasm of skin: Secondary | ICD-10-CM

## 2023-08-06 DIAGNOSIS — D239 Other benign neoplasm of skin, unspecified: Secondary | ICD-10-CM

## 2023-08-06 DIAGNOSIS — D229 Melanocytic nevi, unspecified: Secondary | ICD-10-CM

## 2023-08-06 DIAGNOSIS — L814 Other melanin hyperpigmentation: Secondary | ICD-10-CM | POA: Diagnosis not present

## 2023-08-06 DIAGNOSIS — M67449 Ganglion, unspecified hand: Secondary | ICD-10-CM

## 2023-08-06 DIAGNOSIS — D2371 Other benign neoplasm of skin of right lower limb, including hip: Secondary | ICD-10-CM

## 2023-08-06 DIAGNOSIS — W908XXA Exposure to other nonionizing radiation, initial encounter: Secondary | ICD-10-CM

## 2023-08-06 DIAGNOSIS — Z85828 Personal history of other malignant neoplasm of skin: Secondary | ICD-10-CM

## 2023-08-06 DIAGNOSIS — M67471 Ganglion, right ankle and foot: Secondary | ICD-10-CM

## 2023-08-06 DIAGNOSIS — L821 Other seborrheic keratosis: Secondary | ICD-10-CM

## 2023-08-06 DIAGNOSIS — L740 Miliaria rubra: Secondary | ICD-10-CM

## 2023-08-06 DIAGNOSIS — L578 Other skin changes due to chronic exposure to nonionizing radiation: Secondary | ICD-10-CM

## 2023-08-06 NOTE — Progress Notes (Signed)
 Follow-Up Visit   Subjective  Kristine Hendricks is a 58 y.o. female who presents for the following: Skin Cancer Screening and Full Body Skin Exam. Hx of BCC. Hx of actinic damage.  The patient presents for Total-Body Skin Exam (TBSE) for skin cancer screening and mole check. The patient has spots, moles and lesions to be evaluated, some may be new or changing and the patient may have concern these could be cancer.    The following portions of the chart were reviewed this encounter and updated as appropriate: medications, allergies, medical history  Review of Systems:  No other skin or systemic complaints except as noted in HPI or Assessment and Plan.  Objective  Well appearing patient in no apparent distress; mood and affect are within normal limits.  A full examination was performed including scalp, head, eyes, ears, nose, lips, neck, chest, axillae, abdomen, back, buttocks, bilateral upper extremities, bilateral lower extremities, hands, feet, fingers, toes, fingernails, and toenails. All findings within normal limits unless otherwise noted below.   Relevant physical exam findings are noted in the Assessment and Plan.  Exam of nails limited by presence of nail polish.   upper chest, upper back Scattered small pink papules  Assessment & Plan   SKIN CANCER SCREENING PERFORMED TODAY.  HISTORY OF BASAL CELL CARCINOMA OF THE SKIN. Left lateral calf. 2021. - No evidence of recurrence today - Recommend regular full body skin exams - Recommend daily broad spectrum sunscreen SPF 30+ to sun-exposed areas, reapply every 2 hours as needed.  - Call if any new or changing lesions are noted between office visits   ACTINIC DAMAGE - Chronic condition, secondary to cumulative UV/sun exposure - diffuse scaly erythematous macules with underlying dyspigmentation - Recommend daily broad spectrum sunscreen SPF 30+ to sun-exposed areas, reapply every 2 hours as needed.  - Staying in the shade or  wearing long sleeves, sun glasses (UVA+UVB protection) and wide brim hats (4-inch brim around the entire circumference of the hat) are also recommended for sun protection.  - Call for new or changing lesions.  LENTIGINES, SEBORRHEIC KERATOSES, HEMANGIOMAS - Benign normal skin lesions - Benign-appearing - Call for any changes  MELANOCYTIC NEVI - Tan-brown and/or pink-flesh-colored symmetric macules and papules - Benign appearing on exam today - Observation - Call clinic for new or changing moles - Recommend daily use of broad spectrum spf 30+ sunscreen to sun-exposed areas.  - Check nails when remove polish  DERMATOFIBROMA Exam: Firm pink/brown papulenodule with dimple sign at right ankle. Treatment Plan: A dermatofibroma is a benign growth possibly related to trauma, such as an insect bite, cut from shaving, or inflamed acne-type bump.  Treatment options to remove include shave or excision with resulting scar and risk of recurrence.  Since benign-appearing and not bothersome, will observe for now.    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  A digital mucous cyst also known as a myxoid cyst or pseudocyst is a ganglion cyst arising from the distal interphalangeal (DIP) joint of the finger or thumb (or, less commonly, toe). The cysts are believed to form from degeneration of connective tissue and are associated with osteoarthritic joints or injury. Although the exact etiology is unknown, it is likely that a small tear forms in a joint capsule or tendon sheath, allowing  extravasation of synovial fluid into the adjacent tissue. When the fluid reacts with local tissue, it becomes more gelatinous and a cyst wall forms. With any treatment, there is a high rate of recurrence.   Treatment options include: -  Puncture / Incision & Drainage (I&D) - Intralesional steroid injection - Intralesional Sclerosant injection (Asclera/ Polidocanol) - Intralesional steroid + sclerosant - Corticosteroid tape - Cryosurgery - Laser (CO2) - Infrared photocoagulation - Excision / Surgery  Treatment Plan: Benign-appearing.  Observation.  Call clinic for new or changing lesions.  Recommend daily use of broad spectrum spf 30+ sunscreen to sun-exposed areas.      MILIARIA RUBRA upper chest, upper back Discussed pathogenesis Observe. RTC if not resolving MULTIPLE BENIGN NEVI   LENTIGINES   ACTINIC ELASTOSIS   DERMATOFIBROMA   SEBORRHEIC KERATOSES   CHERRY ANGIOMA   NEVUS SPILUS   DIGITAL MUCOUS CYST    Return in about 1 year (around 08/05/2024) for TBSE, HxBCC.  I, Jill Parcell, CMA, am acting as scribe for Harris Liming, MD.   Documentation: I have reviewed the above documentation for accuracy and completeness, and I agree with the above.  Harris Liming, MD

## 2023-08-06 NOTE — Patient Instructions (Signed)

## 2023-08-06 NOTE — Progress Notes (Signed)
 Reviewed

## 2023-09-02 ENCOUNTER — Other Ambulatory Visit: Payer: Self-pay | Admitting: Internal Medicine

## 2023-09-10 ENCOUNTER — Ambulatory Visit: Admitting: Internal Medicine

## 2023-09-10 ENCOUNTER — Encounter: Payer: Self-pay | Admitting: Internal Medicine

## 2023-09-10 VITALS — BP 108/70 | HR 81 | Temp 98.2°F | Resp 16 | Ht 64.0 in | Wt 277.0 lb

## 2023-09-10 DIAGNOSIS — K2100 Gastro-esophageal reflux disease with esophagitis, without bleeding: Secondary | ICD-10-CM

## 2023-09-10 DIAGNOSIS — R739 Hyperglycemia, unspecified: Secondary | ICD-10-CM | POA: Diagnosis not present

## 2023-09-10 DIAGNOSIS — I10 Essential (primary) hypertension: Secondary | ICD-10-CM

## 2023-09-10 DIAGNOSIS — D649 Anemia, unspecified: Secondary | ICD-10-CM | POA: Diagnosis not present

## 2023-09-10 DIAGNOSIS — E7800 Pure hypercholesterolemia, unspecified: Secondary | ICD-10-CM

## 2023-09-10 DIAGNOSIS — Z860100 Personal history of colon polyps, unspecified: Secondary | ICD-10-CM

## 2023-09-10 DIAGNOSIS — E039 Hypothyroidism, unspecified: Secondary | ICD-10-CM

## 2023-09-10 NOTE — Assessment & Plan Note (Signed)
 On zepbound. Has lost weight. Has modified diet. Continue diet and exercise. Discussed zepbound. Follow met b and A1c.

## 2023-09-10 NOTE — Assessment & Plan Note (Addendum)
 Hgb decreased last check - hgb 10.10/19/2023. started on iron supplements. Colonoscopy 06/01/22 - hyperplastic polyp (cecum). Capsule study 03/20/23 - negative. EGD 11/01/22 - multiple gastric polyps.

## 2023-09-10 NOTE — Progress Notes (Signed)
 Subjective:    Patient ID: Kristine Hendricks, female    DOB: 10/04/1965, 58 y.o.   MRN: [id number]  Patient here for  Chief Complaint  Patient presents with   Medical Management of Chronic Issues    HPI Here for a scheduled follow up - follow up regarding her blood pressure. Continues on losartan  and hydrochlorothiazide . Blood pressure doing well.  No chest pain or sob reported. On zepbound 12.5mg .  feels she is overall tolerating. No nausea or vomiting. No abdominal pain. No constipation. Feels good. Eczema flare - anterior chest and upper back. Planning to f/u with dermatology. Same as previous flares they have treated.    Past Medical History:  Diagnosis Date   Anemia    Asthma    Basal cell carcinoma 04/09/2019   left lateral calf   GERD (gastroesophageal reflux disease)    History of chicken pox    Hypercholesterolemia    Hypertension    Hypothyroidism    Seasonal allergies    Sleep apnea    Past Surgical History:  Procedure Laterality Date   arthroscopic surgery     Dr Cleotilde   BIOPSY  11/01/2022   Procedure: BIOPSY;  Surgeon: Therisa Bi, MD;  Location: [facility] ENDOSCOPY;  Service: Gastroenterology;;   BREAST CYST ASPIRATION Right 2005   BREAST SURGERY  4 /05   biopsy   CHOLECYSTECTOMY  2007   COLONOSCOPY N/A 06/01/2022   Procedure: COLONOSCOPY;  Surgeon: Onita Elspeth Sharper, DO;  Location: [facility] ENDOSCOPY;  Service: Gastroenterology;  Laterality: N/A;   COLONOSCOPY WITH PROPOFOL  N/A 02/14/2016   Procedure: COLONOSCOPY WITH PROPOFOL ;  Surgeon: Lamar ONEIDA Holmes, MD;  Location: [facility] ENDOSCOPY;  Service: Endoscopy;  Laterality: N/A;   DILATION AND CURETTAGE OF UTERUS  8/04   ESOPHAGOGASTRODUODENOSCOPY (EGD) WITH PROPOFOL  N/A 11/01/2022   Procedure: ESOPHAGOGASTRODUODENOSCOPY (EGD) WITH PROPOFOL ;  Surgeon: Therisa Bi, MD;  Location: [facility] ENDOSCOPY;  Service: Gastroenterology;  Laterality: N/A;   GIVENS CAPSULE STUDY N/A 02/19/2023   Procedure: GIVENS CAPSULE STUDY;   Surgeon: Therisa Bi, MD;  Location: [facility] ENDOSCOPY;  Service: Gastroenterology;  Laterality: N/A;   LAPAROSCOPIC GASTRIC BANDING  01/29/09   MYOMECTOMY  9/04   POLYPECTOMY  11/01/2022   Procedure: POLYPECTOMY;  Surgeon: Therisa Bi, MD;  Location: [facility] ENDOSCOPY;  Service: Gastroenterology;;   VAGINAL HYSTERECTOMY  06/11/03   Family History  Problem Relation Age of Onset   Lung cancer Mother    Arthritis Mother    Stroke Mother    Hypertension Mother    Colon polyps Mother    Thyroid disease Sister        graves disease   Colon cancer Other        maternal grandparent   Hyperlipidemia Father    Stroke Maternal Aunt    Stroke Maternal Uncle    Cancer Maternal Grandmother        colon   Alcohol abuse Cousin    Breast cancer Neg Hx    Heart disease Neg Hx    Social History   Socioeconomic History   Marital status: Single    Spouse name: Not on file   Number of children: 0   Years of education: Not on file   Highest education level: Bachelor's degree (e.g., BA, AB, BS)  Occupational History    Employer: [employer]  Tobacco Use   Smoking status: Never   Smokeless tobacco: Never  Vaping Use   Vaping status: Never Used  Substance and Sexual Activity   Alcohol  use: No    Alcohol/week: 0.0 standard drinks of alcohol    Comment: 1 drink/month   Drug use: No   Sexual activity: Not on file  Other Topics Concern   Not on file  Social History Narrative   Not on file   Social Drivers of Health   Financial Resource Strain: Low Risk  (09/06/2023)   Overall Financial Resource Strain (CARDIA)    Difficulty of Paying Living Expenses: Not hard at all  Food Insecurity: No Food Insecurity (09/06/2023)   Hunger Vital Sign    Worried About Running Out of Food in the Last Year: Never true    Ran Out of Food in the Last Year: Never true  Transportation Needs: No Transportation Needs (09/06/2023)   PRAPARE - Administrator, Civil Service (Medical): No    Lack of  Transportation (Non-Medical): No  Physical Activity: Sufficiently Active (09/06/2023)   Exercise Vital Sign    Days of Exercise per Week: 4 days    Minutes of Exercise per Session: 40 min  Stress: No Stress Concern Present (09/06/2023)   [employer] of Occupational Health - Occupational Stress Questionnaire    Feeling of Stress: Only a little  Social Connections: Moderately Integrated (09/06/2023)   Social Connection and Isolation Panel    Frequency of Communication with Friends and Family: More than three times a week    Frequency of Social Gatherings with Friends and Family: Once a week    Attends Religious Services: More than 4 times per year    Active Member of [employer] or Organizations: No    Attends Engineer, structural: Not on file    Marital Status: Living with partner     Review of Systems  Constitutional:  Negative for fever and unexpected weight change.  HENT:  Negative for congestion and sinus pressure.   Respiratory:  Negative for cough, chest tightness and shortness of breath.   Cardiovascular:  Negative for chest pain, palpitations and leg swelling.  Gastrointestinal:  Negative for abdominal pain, diarrhea, nausea and vomiting.  Genitourinary:  Negative for difficulty urinating and dysuria.  Musculoskeletal:  Negative for joint swelling and myalgias.  Skin:  Negative for color change.       Eczema flare.   Neurological:  Negative for dizziness and headaches.  Psychiatric/Behavioral:  Negative for agitation and dysphoric mood.        Objective:     BP 108/70   Pulse 81   Temp 98.2 F (36.8 C)   Resp 16   Ht 5' 4 (1.626 m)   Wt 277 lb (125.6 kg)   LMP 06/11/2003   SpO2 99%   BMI 47.55 kg/m  Wt Readings from Last 3 Encounters:  09/10/23 277 lb (125.6 kg)  01/31/23 (!) 315 lb (142.9 kg)  01/02/23 (!) 326 lb 9.6 oz (148.1 kg)    Physical Exam Vitals reviewed.  Constitutional:      General: She is not in acute distress.    Appearance:  Normal appearance.  HENT:     Head: Normocephalic and atraumatic.     Right Ear: External ear normal.     Left Ear: External ear normal.     Mouth/Throat:     Pharynx: No oropharyngeal exudate or posterior oropharyngeal erythema.   Eyes:     General: No scleral icterus.       Right eye: No discharge.        Left eye: No discharge.     Conjunctiva/sclera:  Conjunctivae normal.   Neck:     Thyroid: No thyromegaly.   Cardiovascular:     Rate and Rhythm: Normal rate and regular rhythm.  Pulmonary:     Effort: No respiratory distress.     Breath sounds: Normal breath sounds. No wheezing.  Abdominal:     General: Bowel sounds are normal.     Palpations: Abdomen is soft.     Tenderness: There is no abdominal tenderness.   Musculoskeletal:        General: No swelling or tenderness.     Cervical back: Neck supple. No tenderness.  Lymphadenopathy:     Cervical: No cervical adenopathy.   Skin:    Findings: No erythema or rash.   Neurological:     Mental Status: She is alert.   Psychiatric:        Mood and Affect: Mood normal.        Behavior: Behavior normal.         Outpatient Encounter Medications as of 09/10/2023  Medication Sig   ZEPBOUND 12.5 MG/0.5ML Pen Inject 12.5 mg into the skin once a week.   albuterol  (VENTOLIN  HFA) 108 (90 Base) MCG/ACT inhaler INHALE 2 PUFFS INTO THE LUNGS EVERY 6 HOURS AS NEEDED FOR WHEEZING OR SHORTNESS OF BREATH   atorvastatin  (LIPITOR) 10 MG tablet TAKE 1 TABLET BY MOUTH DAILY   CALCIUM  PO Take 300 mg by mouth 4 (four) times daily.   hydrochlorothiazide  (MICROZIDE ) 12.5 MG capsule TAKE 1 CAPSULE BY MOUTH DAILY   levothyroxine  (SYNTHROID ) 75 MCG tablet TAKE 1 TABLET(75 MCG) BY MOUTH DAILY BEFORE BREAKFAST   losartan  (COZAAR ) 100 MG tablet TAKE 1 TABLET BY MOUTH DAILY   Multiple Vitamin (MULTIVITAMIN PO) Take by mouth daily.   pantoprazole  (PROTONIX ) 40 MG tablet TAKE 1 TABLET BY MOUTH DAILY   venlafaxine  XR (EFFEXOR -XR) 37.5 MG 24 hr  capsule TAKE 1 CAPSULE BY MOUTH DAILY  WITH BREAKFAST   [DISCONTINUED] ZEPBOUND 5 MG/0.5ML Pen SMARTSIG:5 Milligram(s) SUB-Q Once a Week   No facility-administered encounter medications on file as of 09/10/2023.     Lab Results  Component Value Date   WBC 9.6 04/13/2023   HGB 10.8 (L) 04/13/2023   HCT 35.5 04/13/2023   PLT 398 04/13/2023   GLUCOSE 90 04/13/2023   CHOL 157 01/18/2023   TRIG 135 01/18/2023   HDL 44 01/18/2023   LDLCALC 89 01/18/2023   ALT 12 01/18/2023   AST 19 01/18/2023   NA 143 04/13/2023   K 4.2 04/13/2023   CL 102 04/13/2023   CREATININE 0.91 04/13/2023   BUN 15 04/13/2023   CO2 23 04/13/2023   TSH 4.070 01/18/2023   HGBA1C 5.6 01/12/2023    MM 3D SCREENING MAMMOGRAM BILATERAL BREAST Result Date: 04/20/2023 CLINICAL DATA:  Screening. EXAM: DIGITAL SCREENING BILATERAL MAMMOGRAM WITH TOMOSYNTHESIS AND CAD TECHNIQUE: Bilateral screening digital craniocaudal and mediolateral oblique mammograms were obtained. Bilateral screening digital breast tomosynthesis was performed. The images were evaluated with computer-aided detection. COMPARISON:  Previous exam(s). ACR Breast Density Category a: The breasts are almost entirely fatty. FINDINGS: There are no findings suspicious for malignancy. IMPRESSION: No mammographic evidence of malignancy. A result letter of this screening mammogram will be mailed directly to the patient. RECOMMENDATION: Screening mammogram in one year. (Code:SM-B-01Y) BI-RADS CATEGORY  1: Negative. Electronically Signed   By: Dina  Arceo M.D.   On: 04/20/2023 14:28       Assessment & Plan:  Primary hypertension Assessment & Plan: On losartan  and hydrochlorothiazide . Blood pressure  as outlined.  Check metabolic panel.   Orders: -     Basic metabolic panel with GFR  Anemia, unspecified type Assessment & Plan: Hgb decreased last check - hgb 10.10/19/2023. started on iron supplements. Colonoscopy 06/01/22 - hyperplastic polyp (cecum). Capsule study  03/20/23 - negative. EGD 11/01/22 - multiple gastric polyps.    Hypercholesterolemia Assessment & Plan: On lipitor.  Low cholesterol diet and exercise.  Check lipid panel and liver function tests.   Orders: -     Lipid panel -     Hepatic function panel  Hyperglycemia Assessment & Plan: On zepbound. Has lost weight. Has modified diet. Continue diet and exercise. Discussed zepbound. Follow met b and A1c.   Orders: -     Hemoglobin A1c  Gastroesophageal reflux disease with esophagitis without hemorrhage Assessment & Plan: Taking protonix . No upper symptoms reported.    History of colonic polyps Assessment & Plan: Colonoscopy 06/01/22 - hyperplastic polyp (cecum)   Hypothyroidism, unspecified type Assessment & Plan: On thyroid replacement. Follow tsh.       Allena Hamilton, MD

## 2023-09-10 NOTE — Assessment & Plan Note (Signed)
Taking protonix.  No upper symptoms reported.  

## 2023-09-10 NOTE — Assessment & Plan Note (Addendum)
 On losartan  and hydrochlorothiazide . Blood pressure as outlined.  Check metabolic panel.

## 2023-09-10 NOTE — Assessment & Plan Note (Signed)
 On lipitor.  Low cholesterol diet and exercise.  Check lipid panel and liver function tests.

## 2023-09-10 NOTE — Assessment & Plan Note (Signed)
Colonoscopy 06/01/22 - hyperplastic polyp (cecum)

## 2023-09-10 NOTE — Assessment & Plan Note (Signed)
 On thyroid replacement.  Follow tsh.

## 2023-09-28 LAB — LIPID PANEL
Chol/HDL Ratio: 3.5 ratio (ref 0.0–4.4)
Cholesterol, Total: 155 mg/dL (ref 100–199)
HDL: 44 mg/dL (ref >39)
LDL Chol Calc (NIH): 88 mg/dL (ref 0–99)
Triglycerides: 127 mg/dL (ref 0–149)
VLDL Cholesterol Cal: 23 mg/dL (ref 5–40)

## 2023-09-28 LAB — HEPATIC FUNCTION PANEL
ALT: 16 IU/L (ref 0–32)
AST: 19 IU/L (ref 0–40)
Albumin: 4 g/dL (ref 3.8–4.9)
Alkaline Phosphatase: 105 IU/L (ref 44–121)
Bilirubin Total: 0.5 mg/dL (ref 0.0–1.2)
Bilirubin, Direct: 0.16 mg/dL (ref 0.00–0.40)
Total Protein: 6.2 g/dL (ref 6.0–8.5)

## 2023-09-28 LAB — BASIC METABOLIC PANEL WITH GFR
BUN/Creatinine Ratio: 13 (ref 9–23)
BUN: 11 mg/dL (ref 6–24)
CO2: 23 mmol/L (ref 20–29)
Calcium: 9.6 mg/dL (ref 8.7–10.2)
Chloride: 102 mmol/L (ref 96–106)
Creatinine, Ser: 0.86 mg/dL (ref 0.57–1.00)
Glucose: 76 mg/dL (ref 70–99)
Potassium: 4.8 mmol/L (ref 3.5–5.2)
Sodium: 141 mmol/L (ref 134–144)
eGFR: 78 mL/min/1.73 (ref >59)

## 2023-09-28 LAB — HEMOGLOBIN A1C
Est. average glucose Bld gHb Est-mCnc: 108 mg/dL
Hgb A1c MFr Bld: 5.4 % (ref 4.8–5.6)

## 2023-09-30 ENCOUNTER — Ambulatory Visit: Payer: Self-pay | Admitting: Internal Medicine

## 2024-01-14 ENCOUNTER — Encounter: Payer: Self-pay | Admitting: Internal Medicine

## 2024-01-14 ENCOUNTER — Ambulatory Visit: Admitting: Internal Medicine

## 2024-01-14 VITALS — BP 118/78 | HR 61 | Temp 98.5°F | Ht 64.0 in | Wt 265.0 lb

## 2024-01-14 DIAGNOSIS — Z0000 Encounter for general adult medical examination without abnormal findings: Secondary | ICD-10-CM | POA: Diagnosis not present

## 2024-01-14 DIAGNOSIS — M25552 Pain in left hip: Secondary | ICD-10-CM

## 2024-01-14 DIAGNOSIS — K2100 Gastro-esophageal reflux disease with esophagitis, without bleeding: Secondary | ICD-10-CM

## 2024-01-14 DIAGNOSIS — I10 Essential (primary) hypertension: Secondary | ICD-10-CM

## 2024-01-14 DIAGNOSIS — D649 Anemia, unspecified: Secondary | ICD-10-CM

## 2024-01-14 DIAGNOSIS — Z23 Encounter for immunization: Secondary | ICD-10-CM | POA: Diagnosis not present

## 2024-01-14 DIAGNOSIS — E039 Hypothyroidism, unspecified: Secondary | ICD-10-CM | POA: Diagnosis not present

## 2024-01-14 DIAGNOSIS — Z1231 Encounter for screening mammogram for malignant neoplasm of breast: Secondary | ICD-10-CM

## 2024-01-14 DIAGNOSIS — E7800 Pure hypercholesterolemia, unspecified: Secondary | ICD-10-CM | POA: Diagnosis not present

## 2024-01-14 DIAGNOSIS — R739 Hyperglycemia, unspecified: Secondary | ICD-10-CM

## 2024-01-14 MED ORDER — LOSARTAN POTASSIUM 100 MG PO TABS: 100.0000 mg | ORAL_TABLET | Freq: Every day | ORAL | 1 refills | Status: DC

## 2024-01-14 MED ORDER — PANTOPRAZOLE SODIUM 40 MG PO TBEC: 40.0000 mg | DELAYED_RELEASE_TABLET | Freq: Every day | ORAL | 1 refills | Status: DC

## 2024-01-14 MED ORDER — LEVOTHYROXINE SODIUM 75 MCG PO TABS: 75.0000 ug | ORAL_TABLET | Freq: Every day | ORAL | 1 refills | Status: AC

## 2024-01-14 MED ORDER — ATORVASTATIN CALCIUM 10 MG PO TABS: 10.0000 mg | ORAL_TABLET | Freq: Every day | ORAL | 3 refills | Status: AC

## 2024-01-14 NOTE — Assessment & Plan Note (Signed)
 Physical today 01/15/24.  Colonoscopy 06/01/22 - non bleeding hemorrhoids, tortuous colon, hyperplastic polyp (cecum)  Recommended f/u in 5 years. S/p hysterectomy.  Mammogram 04/18/23 - Birads I. Schedule f/u mammogram.

## 2024-01-14 NOTE — Progress Notes (Signed)
 Subjective:    Patient ID: Kristine Hendricks, female    DOB: [birth year], 58 y.o.   MRN: [id number]  Patient here for  Chief Complaint  Patient presents with   Annual Exam    HPI Here for a physical exam. Continues on losartan  and hydrochlorothiazide . Blood pressure has been doing well. Continues on zepbound. Has been having increased pain - hip. Left hip worse, but appears to be affecting the right hip. Describes hip catching at times.  Increased pain with abduction and pointing the toe inward. Has been going to stretch zone and working with trainer. Has been trying to do exercise to help with the discomfort. Walking does not appear to aggravate. Is tender with palpation - bilateral. Takes tylenol  before bed. No chest pain or sob reported.    Past Medical History:  Diagnosis Date   Anemia    Asthma    Basal cell carcinoma 04/09/2019   left lateral calf   GERD (gastroesophageal reflux disease)    Heart murmur    History of chicken pox    Hypercholesterolemia    Hypertension    Hypothyroidism    Seasonal allergies    Sleep apnea    Past Surgical History:  Procedure Laterality Date   arthroscopic surgery     Dr Cleotilde   BIOPSY  11/01/2022   Procedure: BIOPSY;  Surgeon: Therisa Bi, MD;  Location: [facility] ENDOSCOPY;  Service: Gastroenterology;;   BREAST CYST ASPIRATION Right 2005   BREAST SURGERY  4 /05   biopsy   CHOLECYSTECTOMY  2007   COLONOSCOPY N/A 06/01/2022   Procedure: COLONOSCOPY;  Surgeon: Onita Elspeth Sharper, DO;  Location: [facility] ENDOSCOPY;  Service: Gastroenterology;  Laterality: N/A;   COLONOSCOPY WITH PROPOFOL  N/A 02/14/2016   Procedure: COLONOSCOPY WITH PROPOFOL ;  Surgeon: Lamar ONEIDA Holmes, MD;  Location: [facility] ENDOSCOPY;  Service: Endoscopy;  Laterality: N/A;   DILATION AND CURETTAGE OF UTERUS  8/04   ESOPHAGOGASTRODUODENOSCOPY (EGD) WITH PROPOFOL  N/A 11/01/2022   Procedure: ESOPHAGOGASTRODUODENOSCOPY (EGD) WITH PROPOFOL ;  Surgeon: Therisa Bi, MD;  Location: [facility]  ENDOSCOPY;  Service: Gastroenterology;  Laterality: N/A;   GIVENS CAPSULE STUDY N/A 02/19/2023   Procedure: GIVENS CAPSULE STUDY;  Surgeon: Therisa Bi, MD;  Location: [facility] ENDOSCOPY;  Service: Gastroenterology;  Laterality: N/A;   LAPAROSCOPIC GASTRIC BANDING  01/29/09   MYOMECTOMY  9/04   POLYPECTOMY  11/01/2022   Procedure: POLYPECTOMY;  Surgeon: Therisa Bi, MD;  Location: [facility] ENDOSCOPY;  Service: Gastroenterology;;   VAGINAL HYSTERECTOMY  06/11/03   Family History  Problem Relation Age of Onset   Lung cancer Mother    Arthritis Mother    Stroke Mother    Hypertension Mother    Colon polyps Mother    Asthma Mother    COPD Mother    Depression Mother    Kidney disease Mother    Vision loss Mother    Varicose Veins Mother    Thyroid disease Sister        graves disease   Hyperlipidemia Sister    Miscarriages / Stillbirths Sister    Colon cancer Other        maternal grandparent   Hyperlipidemia Father    Hearing loss Father    Hypertension Father    Stroke Maternal Aunt    Stroke Maternal Uncle    Cancer Maternal Grandmother        colon   Alcohol abuse Cousin    Miscarriages / Stillbirths Sister    Obesity Sister    Stroke  Maternal Aunt    Breast cancer Neg Hx    Heart disease Neg Hx    Social History   Socioeconomic History   Marital status: Single    Spouse name: Not on file   Number of children: 0   Years of education: Not on file   Highest education level: Bachelor's degree (e.g., BA, AB, BS)  Occupational History    Employer: [employer]  Tobacco Use   Smoking status: Never   Smokeless tobacco: Never  Vaping Use   Vaping status: Never Used  Substance and Sexual Activity   Alcohol use: No    Comment: 1 drink/month   Drug use: No   Sexual activity: Yes    Birth control/protection: None  Other Topics Concern   Not on file  Social History Narrative   Not on file   Social Drivers of Health   Financial Resource Strain: Low Risk  (01/11/2024)    Overall Financial Resource Strain (CARDIA)    Difficulty of Paying Living Expenses: Not hard at all  Food Insecurity: No Food Insecurity (01/11/2024)   Hunger Vital Sign    Worried About Running Out of Food in the Last Year: Never true    Ran Out of Food in the Last Year: Never true  Transportation Needs: No Transportation Needs (01/11/2024)   PRAPARE - Administrator, Civil Service (Medical): No    Lack of Transportation (Non-Medical): No  Physical Activity: Insufficiently Active (01/11/2024)   Exercise Vital Sign    Days of Exercise per Week: 3 days    Minutes of Exercise per Session: 30 min  Stress: No Stress Concern Present (01/11/2024)   [employer] of Occupational Health - Occupational Stress Questionnaire    Feeling of Stress: Not at all  Social Connections: Moderately Integrated (01/11/2024)   Social Connection and Isolation Panel    Frequency of Communication with Friends and Family: More than three times a week    Frequency of Social Gatherings with Friends and Family: Twice a week    Attends Religious Services: More than 4 times per year    Active Member of [employer] or Organizations: No    Attends Engineer, Structural: Not on file    Marital Status: Living with partner     Review of Systems  Constitutional:  Negative for appetite change and unexpected weight change.  HENT:  Negative for congestion, sinus pressure and sore throat.   Eyes:  Negative for pain and visual disturbance.  Respiratory:  Negative for cough, chest tightness and shortness of breath.   Cardiovascular:  Negative for chest pain, palpitations and leg swelling.  Gastrointestinal:  Negative for abdominal pain, diarrhea, nausea and vomiting.  Genitourinary:  Negative for difficulty urinating and dysuria.  Musculoskeletal:  Negative for joint swelling.       Hip pain as outlined.   Skin:  Negative for color change and rash.  Neurological:  Negative for dizziness and headaches.   Hematological:  Negative for adenopathy. Does not bruise/bleed easily.  Psychiatric/Behavioral:  Negative for agitation and dysphoric mood.        Objective:     BP 118/78   Pulse 61   Temp 98.5 F (36.9 C) (Oral)   Ht 5' 4 (1.626 m)   Wt 265 lb (120.2 kg)   LMP 06/11/2003   SpO2 96%   BMI 45.49 kg/m  Wt Readings from Last 3 Encounters:  01/14/24 265 lb (120.2 kg)  09/10/23 277 lb (125.6 kg)  01/31/23 (!) 315 lb (142.9 kg)    Physical Exam Vitals reviewed.  Constitutional:      General: She is not in acute distress.    Appearance: Normal appearance. She is well-developed.  HENT:     Head: Normocephalic and atraumatic.     Right Ear: External ear normal.     Left Ear: External ear normal.     Mouth/Throat:     Pharynx: No oropharyngeal exudate or posterior oropharyngeal erythema.  Eyes:     General: No scleral icterus.       Right eye: No discharge.        Left eye: No discharge.     Conjunctiva/sclera: Conjunctivae normal.  Neck:     Thyroid: No thyromegaly.  Cardiovascular:     Rate and Rhythm: Normal rate and regular rhythm.  Pulmonary:     Effort: No tachypnea, accessory muscle usage or respiratory distress.     Breath sounds: Normal breath sounds. No decreased breath sounds or wheezing.  Chest:  Breasts:    Right: No inverted nipple, mass, nipple discharge or tenderness (no axillary adenopathy).     Left: No inverted nipple, mass, nipple discharge or tenderness (no axilarry adenopathy).  Abdominal:     General: Bowel sounds are normal.     Palpations: Abdomen is soft.     Tenderness: There is no abdominal tenderness.  Musculoskeletal:        General: No swelling or tenderness.     Cervical back: Neck supple. No tenderness.     Comments: Some increased pain with abduction/adduction of lower extremity - hip/groin.   Lymphadenopathy:     Cervical: No cervical adenopathy.  Skin:    Findings: No erythema or rash.  Neurological:     Mental Status: She  is alert and oriented to person, place, and time.  Psychiatric:        Mood and Affect: Mood normal.        Behavior: Behavior normal.         Outpatient Encounter Medications as of 01/14/2024  Medication Sig   albuterol  (VENTOLIN  HFA) 108 (90 Base) MCG/ACT inhaler INHALE 2 PUFFS INTO THE LUNGS EVERY 6 HOURS AS NEEDED FOR WHEEZING OR SHORTNESS OF BREATH   CALCIUM  PO Take 300 mg by mouth 4 (four) times daily.   hydrochlorothiazide  (MICROZIDE ) 12.5 MG capsule TAKE 1 CAPSULE BY MOUTH DAILY   Multiple Vitamin (MULTIVITAMIN PO) Take by mouth daily.   venlafaxine  XR (EFFEXOR -XR) 37.5 MG 24 hr capsule TAKE 1 CAPSULE BY MOUTH DAILY  WITH BREAKFAST   ZEPBOUND 15 MG/0.5ML Pen Inject into the skin once a week.   atorvastatin  (LIPITOR) 10 MG tablet Take 1 tablet (10 mg total) by mouth daily.   levothyroxine  (SYNTHROID ) 75 MCG tablet Take 1 tablet (75 mcg total) by mouth daily before breakfast.   losartan  (COZAAR ) 100 MG tablet Take 1 tablet (100 mg total) by mouth daily.   pantoprazole  (PROTONIX ) 40 MG tablet Take 1 tablet (40 mg total) by mouth daily.   [DISCONTINUED] atorvastatin  (LIPITOR) 10 MG tablet TAKE 1 TABLET BY MOUTH DAILY   [DISCONTINUED] levothyroxine  (SYNTHROID ) 75 MCG tablet TAKE 1 TABLET(75 MCG) BY MOUTH DAILY BEFORE BREAKFAST   [DISCONTINUED] losartan  (COZAAR ) 100 MG tablet TAKE 1 TABLET BY MOUTH DAILY   [DISCONTINUED] pantoprazole  (PROTONIX ) 40 MG tablet TAKE 1 TABLET BY MOUTH DAILY   [DISCONTINUED] ZEPBOUND 12.5 MG/0.5ML Pen Inject 12.5 mg into the skin once a week.   No facility-administered encounter medications on file  as of 01/14/2024.     Lab Results  Component Value Date   WBC 9.6 04/13/2023   HGB 10.8 (L) 04/13/2023   HCT 35.5 04/13/2023   PLT 398 04/13/2023   GLUCOSE 76 09/27/2023   CHOL 155 09/27/2023   TRIG 127 09/27/2023   HDL 44 09/27/2023   LDLCALC 88 09/27/2023   ALT 16 09/27/2023   AST 19 09/27/2023   NA 141 09/27/2023   K 4.8 09/27/2023   CL 102  09/27/2023   CREATININE 0.86 09/27/2023   BUN 11 09/27/2023   CO2 23 09/27/2023   TSH 4.070 01/18/2023   HGBA1C 5.4 09/27/2023    MM 3D SCREENING MAMMOGRAM BILATERAL BREAST Result Date: 04/20/2023 CLINICAL DATA:  Screening. EXAM: DIGITAL SCREENING BILATERAL MAMMOGRAM WITH TOMOSYNTHESIS AND CAD TECHNIQUE: Bilateral screening digital craniocaudal and mediolateral oblique mammograms were obtained. Bilateral screening digital breast tomosynthesis was performed. The images were evaluated with computer-aided detection. COMPARISON:  Previous exam(s). ACR Breast Density Category a: The breasts are almost entirely fatty. FINDINGS: There are no findings suspicious for malignancy. IMPRESSION: No mammographic evidence of malignancy. A result letter of this screening mammogram will be mailed directly to the patient. RECOMMENDATION: Screening mammogram in one year. (Code:SM-B-01Y) BI-RADS CATEGORY  1: Negative. Electronically Signed   By: Dina  Arceo M.D.   On: 04/20/2023 14:28       Assessment & Plan:  Routine general medical examination at a health care facility  Hypercholesterolemia Assessment & Plan: On lipitor.  Low cholesterol diet and exercise.  Follow lipid panel and liver function tests.   Orders: -     Hepatic function panel -     Lipid panel -     CBC with Differential/Platelet -     Ferritin  Primary hypertension Assessment & Plan: On losartan  and hydrochlorothiazide . Blood pressure as outlined.  Check metabolic panel.   Orders: -     Basic metabolic panel with GFR  Hypothyroidism, unspecified type Assessment & Plan: On thyroid replacement. Follow tsh   Orders: -     TSH  Health care maintenance Assessment & Plan: Physical today 01/15/24.  Colonoscopy 06/01/22 - non bleeding hemorrhoids, tortuous colon, hyperplastic polyp (cecum)  Recommended f/u in 5 years. S/p hysterectomy.  Mammogram 04/18/23 - Birads I. Schedule f/u mammogram.    Need for influenza vaccination -     Flu  vaccine trivalent PF, 6mos and older(Flulaval,Afluria,Fluarix,Fluzone)  Encounter for screening mammogram for malignant neoplasm of breast -     3D Screening Mammogram, Left and Right; Future  Left hip pain Assessment & Plan: Hip pain as outlined. Worse in left. Appears to be aggravating the right. Has been trying to do exercises with trainer. Taking tylenol . Given persistence, refer to ortho - request to see Dr Marchia.   Orders: -     Ambulatory referral to Orthopedic Surgery  Hyperglycemia Assessment & Plan: On zepbound. Has lost weight. Has modified diet. Continue diet and exercise. Follow met b and A1c.    Anemia, unspecified type Assessment & Plan: Hgb decreased last check - hgb 10.10/19/2023. started on iron supplements. Colonoscopy 06/01/22 - hyperplastic polyp (cecum). Capsule study 03/20/23 - negative. EGD 11/01/22 - multiple gastric polyps.  Check cbc and iron studies.    Gastroesophageal reflux disease with esophagitis without hemorrhage Assessment & Plan: Taking protonix . No upper symptoms reported.    Other orders -     Atorvastatin  Calcium ; Take 1 tablet (10 mg total) by mouth daily.  Dispense: 90 tablet; Refill: 3 -  Levothyroxine  Sodium; Take 1 tablet (75 mcg total) by mouth daily before breakfast.  Dispense: 90 tablet; Refill: 1 -     Losartan  Potassium; Take 1 tablet (100 mg total) by mouth daily.  Dispense: 90 tablet; Refill: 1 -     Pantoprazole  Sodium; Take 1 tablet (40 mg total) by mouth daily.  Dispense: 90 tablet; Refill: 1     Allena Hamilton, MD

## 2024-01-20 ENCOUNTER — Encounter: Payer: Self-pay | Admitting: Internal Medicine

## 2024-01-20 NOTE — Assessment & Plan Note (Signed)
 On losartan  and hydrochlorothiazide . Blood pressure as outlined.  Check metabolic panel.

## 2024-01-20 NOTE — Assessment & Plan Note (Signed)
 On lipitor.  Low cholesterol diet and exercise.  Follow lipid panel and liver function tests.

## 2024-01-20 NOTE — Assessment & Plan Note (Signed)
 Hgb decreased last check - hgb 10.10/19/2023. started on iron supplements. Colonoscopy 06/01/22 - hyperplastic polyp (cecum). Capsule study 03/20/23 - negative. EGD 11/01/22 - multiple gastric polyps.  Check cbc and iron studies.

## 2024-01-20 NOTE — Assessment & Plan Note (Signed)
Taking protonix.  No upper symptoms reported.  

## 2024-01-20 NOTE — Assessment & Plan Note (Signed)
 On zepbound. Has lost weight. Has modified diet. Continue diet and exercise. Follow met b and A1c.

## 2024-01-20 NOTE — Assessment & Plan Note (Signed)
 Hip pain as outlined. Worse in left. Appears to be aggravating the right. Has been trying to do exercises with trainer. Taking tylenol . Given persistence, refer to ortho - request to see Dr Marchia.

## 2024-01-20 NOTE — Assessment & Plan Note (Signed)
 On thyroid replacement.  Follow tsh.

## 2024-02-11 ENCOUNTER — Encounter: Payer: Self-pay | Admitting: Internal Medicine

## 2024-02-11 DIAGNOSIS — M25551 Pain in right hip: Secondary | ICD-10-CM | POA: Insufficient documentation

## 2024-02-17 ENCOUNTER — Other Ambulatory Visit: Payer: Self-pay | Admitting: Internal Medicine

## 2024-02-21 LAB — BASIC METABOLIC PANEL WITH GFR
BUN/Creatinine Ratio: 16 (ref 9–23)
BUN: 16 mg/dL (ref 6–24)
CO2: 21 mmol/L (ref 20–29)
Calcium: 9.6 mg/dL (ref 8.7–10.2)
Chloride: 104 mmol/L (ref 96–106)
Creatinine, Ser: 0.99 mg/dL (ref 0.57–1.00)
Glucose: 82 mg/dL (ref 70–99)
Potassium: 4.6 mmol/L (ref 3.5–5.2)
Sodium: 142 mmol/L (ref 134–144)
eGFR: 66 mL/min/1.73 (ref >59)

## 2024-02-21 LAB — FERRITIN: Ferritin: 14 ng/mL — ABNORMAL LOW (ref 15–150)

## 2024-02-21 LAB — CBC WITH DIFFERENTIAL/PLATELET
Basophils Absolute: 0.1 x10E3/uL (ref 0.0–0.2)
Basos: 1 %
EOS (ABSOLUTE): 0.1 x10E3/uL (ref 0.0–0.4)
Eos: 2 %
Hematocrit: 40.9 % (ref 34.0–46.6)
Hemoglobin: 12.7 g/dL (ref 11.1–15.9)
Immature Grans (Abs): 0 x10E3/uL (ref 0.0–0.1)
Immature Granulocytes: 0 %
Lymphocytes Absolute: 1.6 x10E3/uL (ref 0.7–3.1)
Lymphs: 19 %
MCH: 25.4 pg — ABNORMAL LOW (ref 26.6–33.0)
MCHC: 31.1 g/dL — ABNORMAL LOW (ref 31.5–35.7)
MCV: 82 fL (ref 79–97)
Monocytes Absolute: 0.7 x10E3/uL (ref 0.1–0.9)
Monocytes: 9 %
Neutrophils Absolute: 5.7 x10E3/uL (ref 1.4–7.0)
Neutrophils: 69 %
Platelets: 354 x10E3/uL (ref 150–450)
RBC: 5 x10E6/uL (ref 3.77–5.28)
RDW: 15.4 % (ref 11.7–15.4)
WBC: 8.1 x10E3/uL (ref 3.4–10.8)

## 2024-02-21 LAB — LIPID PANEL
Chol/HDL Ratio: 2.9 ratio (ref 0.0–4.4)
Cholesterol, Total: 163 mg/dL (ref 100–199)
HDL: 57 mg/dL (ref >39)
LDL Chol Calc (NIH): 85 mg/dL (ref 0–99)
Triglycerides: 115 mg/dL (ref 0–149)
VLDL Cholesterol Cal: 21 mg/dL (ref 5–40)

## 2024-02-21 LAB — TSH: TSH: 2.58 u[IU]/mL (ref 0.450–4.500)

## 2024-02-21 LAB — HEPATIC FUNCTION PANEL
ALT: 18 IU/L (ref 0–32)
AST: 24 IU/L (ref 0–40)
Albumin: 4.1 g/dL (ref 3.8–4.9)
Alkaline Phosphatase: 91 IU/L (ref 49–135)
Bilirubin Total: 0.5 mg/dL (ref 0.0–1.2)
Bilirubin, Direct: 0.17 mg/dL (ref 0.00–0.40)
Total Protein: 6.2 g/dL (ref 6.0–8.5)

## 2024-02-22 ENCOUNTER — Ambulatory Visit: Payer: Self-pay | Admitting: Internal Medicine

## 2024-02-22 DIAGNOSIS — D649 Anemia, unspecified: Secondary | ICD-10-CM

## 2024-02-22 DIAGNOSIS — E7800 Pure hypercholesterolemia, unspecified: Secondary | ICD-10-CM

## 2024-02-26 NOTE — Telephone Encounter (Signed)
 Order placed for future [employer] labs.

## 2024-05-19 ENCOUNTER — Ambulatory Visit: Admitting: Internal Medicine

## 2024-08-05 ENCOUNTER — Ambulatory Visit: Admitting: Dermatology
